# Patient Record
Sex: Female | Born: 1940 | Race: White | Hispanic: No | Smoking: Never smoker
Health system: Southern US, Community
[De-identification: ages and names within clinical notes are randomized; demographics above are authoritative.]

## PROBLEM LIST (undated history)

## (undated) DIAGNOSIS — H269 Unspecified cataract: Secondary | ICD-10-CM

## (undated) DIAGNOSIS — K7689 Other specified diseases of liver: Secondary | ICD-10-CM

## (undated) DIAGNOSIS — K2270 Barrett's esophagus without dysplasia: Secondary | ICD-10-CM

## (undated) DIAGNOSIS — I10 Essential (primary) hypertension: Secondary | ICD-10-CM

## (undated) DIAGNOSIS — I480 Paroxysmal atrial fibrillation: Secondary | ICD-10-CM

## (undated) DIAGNOSIS — G2581 Restless legs syndrome: Secondary | ICD-10-CM

## (undated) DIAGNOSIS — M4800 Spinal stenosis, site unspecified: Secondary | ICD-10-CM

## (undated) DIAGNOSIS — G4730 Sleep apnea, unspecified: Secondary | ICD-10-CM

## (undated) DIAGNOSIS — E785 Hyperlipidemia, unspecified: Secondary | ICD-10-CM

## (undated) DIAGNOSIS — M8700 Idiopathic aseptic necrosis of unspecified bone: Secondary | ICD-10-CM

## (undated) DIAGNOSIS — K219 Gastro-esophageal reflux disease without esophagitis: Secondary | ICD-10-CM

## (undated) DIAGNOSIS — K279 Peptic ulcer, site unspecified, unspecified as acute or chronic, without hemorrhage or perforation: Secondary | ICD-10-CM

## (undated) DIAGNOSIS — R002 Palpitations: Secondary | ICD-10-CM

## (undated) DIAGNOSIS — M1990 Unspecified osteoarthritis, unspecified site: Secondary | ICD-10-CM

## (undated) DIAGNOSIS — K449 Diaphragmatic hernia without obstruction or gangrene: Secondary | ICD-10-CM

## (undated) DIAGNOSIS — R0789 Other chest pain: Secondary | ICD-10-CM

## (undated) DIAGNOSIS — E559 Vitamin D deficiency, unspecified: Secondary | ICD-10-CM

## (undated) DIAGNOSIS — Z5189 Encounter for other specified aftercare: Secondary | ICD-10-CM

## (undated) DIAGNOSIS — F329 Major depressive disorder, single episode, unspecified: Secondary | ICD-10-CM

## (undated) DIAGNOSIS — F32A Depression, unspecified: Secondary | ICD-10-CM

## (undated) HISTORY — PX: DECOMPRESSION CORE HIP: SUR398

## (undated) HISTORY — DX: Major depressive disorder, single episode, unspecified: F32.9

## (undated) HISTORY — PX: HIP FRACTURE SURGERY: SHX118

## (undated) HISTORY — PX: CHOLECYSTECTOMY: SHX55

## (undated) HISTORY — DX: Spinal stenosis, site unspecified: M48.00

## (undated) HISTORY — DX: Sleep apnea, unspecified: G47.30

## (undated) HISTORY — DX: Gastro-esophageal reflux disease without esophagitis: K21.9

## (undated) HISTORY — DX: Idiopathic aseptic necrosis of unspecified bone: M87.00

## (undated) HISTORY — DX: Vitamin D deficiency, unspecified: E55.9

## (undated) HISTORY — DX: Other chest pain: R07.89

## (undated) HISTORY — DX: Unspecified cataract: H26.9

## (undated) HISTORY — DX: Encounter for other specified aftercare: Z51.89

## (undated) HISTORY — DX: Palpitations: R00.2

## (undated) HISTORY — PX: COLONOSCOPY: SHX174

## (undated) HISTORY — DX: Diaphragmatic hernia without obstruction or gangrene: K44.9

## (undated) HISTORY — PX: TUBAL LIGATION: SHX77

## (undated) HISTORY — DX: Peptic ulcer, site unspecified, unspecified as acute or chronic, without hemorrhage or perforation: K27.9

## (undated) HISTORY — PX: BREAST REDUCTION SURGERY: SHX8

## (undated) HISTORY — DX: Paroxysmal atrial fibrillation: I48.0

## (undated) HISTORY — DX: Barrett's esophagus without dysplasia: K22.70

## (undated) HISTORY — DX: Other specified diseases of liver: K76.89

## (undated) HISTORY — DX: Hyperlipidemia, unspecified: E78.5

## (undated) HISTORY — DX: Depression, unspecified: F32.A

## (undated) HISTORY — DX: Unspecified osteoarthritis, unspecified site: M19.90

---

## 1998-05-12 ENCOUNTER — Encounter: Admission: RE | Admit: 1998-05-12 | Discharge: 1998-08-10 | Payer: Self-pay | Admitting: Family Medicine

## 1998-07-11 ENCOUNTER — Other Ambulatory Visit: Admission: RE | Admit: 1998-07-11 | Discharge: 1998-07-11 | Payer: Self-pay | Admitting: Obstetrics and Gynecology

## 2003-02-10 ENCOUNTER — Emergency Department (HOSPITAL_COMMUNITY): Admission: EM | Admit: 2003-02-10 | Discharge: 2003-02-10 | Payer: Self-pay | Admitting: Emergency Medicine

## 2003-02-11 ENCOUNTER — Encounter (INDEPENDENT_AMBULATORY_CARE_PROVIDER_SITE_OTHER): Payer: Self-pay | Admitting: *Deleted

## 2003-02-11 ENCOUNTER — Encounter: Payer: Self-pay | Admitting: Emergency Medicine

## 2003-02-12 ENCOUNTER — Encounter: Payer: Self-pay | Admitting: Gastroenterology

## 2003-02-12 ENCOUNTER — Encounter (INDEPENDENT_AMBULATORY_CARE_PROVIDER_SITE_OTHER): Payer: Self-pay | Admitting: *Deleted

## 2003-02-12 ENCOUNTER — Inpatient Hospital Stay (HOSPITAL_COMMUNITY): Admission: EM | Admit: 2003-02-12 | Discharge: 2003-02-14 | Payer: Self-pay | Admitting: Gastroenterology

## 2003-02-13 ENCOUNTER — Encounter (INDEPENDENT_AMBULATORY_CARE_PROVIDER_SITE_OTHER): Payer: Self-pay | Admitting: Specialist

## 2003-02-13 ENCOUNTER — Encounter (INDEPENDENT_AMBULATORY_CARE_PROVIDER_SITE_OTHER): Payer: Self-pay | Admitting: *Deleted

## 2003-02-14 ENCOUNTER — Encounter (INDEPENDENT_AMBULATORY_CARE_PROVIDER_SITE_OTHER): Payer: Self-pay | Admitting: *Deleted

## 2003-03-16 ENCOUNTER — Ambulatory Visit (HOSPITAL_COMMUNITY): Admission: RE | Admit: 2003-03-16 | Discharge: 2003-03-16 | Payer: Self-pay | Admitting: Gastroenterology

## 2003-03-16 ENCOUNTER — Encounter (INDEPENDENT_AMBULATORY_CARE_PROVIDER_SITE_OTHER): Payer: Self-pay | Admitting: *Deleted

## 2005-08-27 ENCOUNTER — Inpatient Hospital Stay (HOSPITAL_COMMUNITY): Admission: RE | Admit: 2005-08-27 | Discharge: 2005-09-01 | Payer: Self-pay | Admitting: Orthopedic Surgery

## 2005-08-27 ENCOUNTER — Ambulatory Visit: Payer: Self-pay | Admitting: Physical Medicine & Rehabilitation

## 2005-12-12 ENCOUNTER — Ambulatory Visit: Payer: Self-pay | Admitting: Physical Medicine & Rehabilitation

## 2005-12-12 ENCOUNTER — Inpatient Hospital Stay (HOSPITAL_COMMUNITY): Admission: RE | Admit: 2005-12-12 | Discharge: 2005-12-17 | Payer: Self-pay | Admitting: Orthopedic Surgery

## 2007-03-11 ENCOUNTER — Encounter (INDEPENDENT_AMBULATORY_CARE_PROVIDER_SITE_OTHER): Payer: Self-pay | Admitting: *Deleted

## 2007-04-15 ENCOUNTER — Encounter (INDEPENDENT_AMBULATORY_CARE_PROVIDER_SITE_OTHER): Payer: Self-pay | Admitting: *Deleted

## 2007-04-23 ENCOUNTER — Encounter (INDEPENDENT_AMBULATORY_CARE_PROVIDER_SITE_OTHER): Payer: Self-pay | Admitting: *Deleted

## 2007-04-25 ENCOUNTER — Encounter (INDEPENDENT_AMBULATORY_CARE_PROVIDER_SITE_OTHER): Payer: Self-pay | Admitting: *Deleted

## 2007-11-06 ENCOUNTER — Encounter: Payer: Self-pay | Admitting: Pulmonary Disease

## 2007-12-30 ENCOUNTER — Encounter: Payer: Self-pay | Admitting: Pulmonary Disease

## 2008-06-23 ENCOUNTER — Encounter (INDEPENDENT_AMBULATORY_CARE_PROVIDER_SITE_OTHER): Payer: Self-pay | Admitting: *Deleted

## 2008-07-12 ENCOUNTER — Encounter (INDEPENDENT_AMBULATORY_CARE_PROVIDER_SITE_OTHER): Payer: Self-pay | Admitting: *Deleted

## 2008-08-17 ENCOUNTER — Encounter (INDEPENDENT_AMBULATORY_CARE_PROVIDER_SITE_OTHER): Payer: Self-pay | Admitting: *Deleted

## 2008-08-24 ENCOUNTER — Encounter (INDEPENDENT_AMBULATORY_CARE_PROVIDER_SITE_OTHER): Payer: Self-pay | Admitting: *Deleted

## 2008-09-02 ENCOUNTER — Encounter (INDEPENDENT_AMBULATORY_CARE_PROVIDER_SITE_OTHER): Payer: Self-pay | Admitting: *Deleted

## 2009-03-18 ENCOUNTER — Encounter (INDEPENDENT_AMBULATORY_CARE_PROVIDER_SITE_OTHER): Payer: Self-pay | Admitting: *Deleted

## 2009-04-01 ENCOUNTER — Encounter (INDEPENDENT_AMBULATORY_CARE_PROVIDER_SITE_OTHER): Payer: Self-pay | Admitting: *Deleted

## 2009-04-14 ENCOUNTER — Encounter (INDEPENDENT_AMBULATORY_CARE_PROVIDER_SITE_OTHER): Payer: Self-pay | Admitting: *Deleted

## 2009-04-19 ENCOUNTER — Encounter (INDEPENDENT_AMBULATORY_CARE_PROVIDER_SITE_OTHER): Payer: Self-pay | Admitting: *Deleted

## 2009-04-27 ENCOUNTER — Encounter (INDEPENDENT_AMBULATORY_CARE_PROVIDER_SITE_OTHER): Payer: Self-pay | Admitting: *Deleted

## 2009-05-05 ENCOUNTER — Encounter (INDEPENDENT_AMBULATORY_CARE_PROVIDER_SITE_OTHER): Payer: Self-pay | Admitting: *Deleted

## 2009-05-11 ENCOUNTER — Encounter (INDEPENDENT_AMBULATORY_CARE_PROVIDER_SITE_OTHER): Payer: Self-pay | Admitting: *Deleted

## 2009-09-24 HISTORY — PX: EYE SURGERY: SHX253

## 2010-04-12 ENCOUNTER — Encounter (INDEPENDENT_AMBULATORY_CARE_PROVIDER_SITE_OTHER): Payer: Self-pay | Admitting: *Deleted

## 2010-06-13 DIAGNOSIS — M8700 Idiopathic aseptic necrosis of unspecified bone: Secondary | ICD-10-CM | POA: Insufficient documentation

## 2010-06-13 DIAGNOSIS — F329 Major depressive disorder, single episode, unspecified: Secondary | ICD-10-CM

## 2010-06-13 DIAGNOSIS — Z8719 Personal history of other diseases of the digestive system: Secondary | ICD-10-CM

## 2010-06-13 DIAGNOSIS — K7689 Other specified diseases of liver: Secondary | ICD-10-CM | POA: Insufficient documentation

## 2010-06-13 DIAGNOSIS — M1990 Unspecified osteoarthritis, unspecified site: Secondary | ICD-10-CM | POA: Insufficient documentation

## 2010-06-13 DIAGNOSIS — M26629 Arthralgia of temporomandibular joint, unspecified side: Secondary | ICD-10-CM

## 2010-06-19 DIAGNOSIS — R197 Diarrhea, unspecified: Secondary | ICD-10-CM | POA: Insufficient documentation

## 2010-06-19 DIAGNOSIS — E669 Obesity, unspecified: Secondary | ICD-10-CM

## 2010-06-19 DIAGNOSIS — K5730 Diverticulosis of large intestine without perforation or abscess without bleeding: Secondary | ICD-10-CM | POA: Insufficient documentation

## 2010-06-19 DIAGNOSIS — I480 Paroxysmal atrial fibrillation: Secondary | ICD-10-CM

## 2010-06-19 DIAGNOSIS — K449 Diaphragmatic hernia without obstruction or gangrene: Secondary | ICD-10-CM | POA: Insufficient documentation

## 2010-06-19 DIAGNOSIS — G2581 Restless legs syndrome: Secondary | ICD-10-CM

## 2010-06-20 ENCOUNTER — Ambulatory Visit: Payer: Self-pay | Admitting: Internal Medicine

## 2010-07-13 ENCOUNTER — Ambulatory Visit: Payer: Self-pay | Admitting: Obstetrics and Gynecology

## 2010-07-13 ENCOUNTER — Other Ambulatory Visit: Admission: RE | Admit: 2010-07-13 | Discharge: 2010-07-13 | Payer: Self-pay | Admitting: Obstetrics and Gynecology

## 2010-07-19 ENCOUNTER — Ambulatory Visit: Payer: Self-pay | Admitting: Obstetrics and Gynecology

## 2010-10-05 ENCOUNTER — Encounter: Payer: Self-pay | Admitting: Internal Medicine

## 2010-10-09 ENCOUNTER — Ambulatory Visit: Payer: Self-pay | Admitting: Cardiovascular Disease

## 2010-10-10 ENCOUNTER — Encounter: Payer: Self-pay | Admitting: Internal Medicine

## 2010-10-18 ENCOUNTER — Ambulatory Visit
Admission: RE | Admit: 2010-10-18 | Discharge: 2010-10-18 | Payer: Self-pay | Source: Home / Self Care | Attending: Internal Medicine | Admitting: Internal Medicine

## 2010-10-18 ENCOUNTER — Encounter: Payer: Self-pay | Admitting: Internal Medicine

## 2010-10-18 ENCOUNTER — Other Ambulatory Visit: Payer: Self-pay | Admitting: Internal Medicine

## 2010-10-18 LAB — HEPATIC FUNCTION PANEL
ALT: 14 U/L (ref 0–35)
Albumin: 3.8 g/dL (ref 3.5–5.2)
Alkaline Phosphatase: 88 U/L (ref 39–117)
Bilirubin, Direct: 0.1 mg/dL (ref 0.0–0.3)
Total Bilirubin: 0.6 mg/dL (ref 0.3–1.2)
Total Protein: 6.6 g/dL (ref 6.0–8.3)

## 2010-10-18 LAB — CBC WITH DIFFERENTIAL/PLATELET
Basophils Absolute: 0 10*3/uL (ref 0.0–0.1)
Eosinophils Absolute: 0.1 10*3/uL (ref 0.0–0.7)
Eosinophils Relative: 1.5 % (ref 0.0–5.0)
HCT: 39.6 % (ref 36.0–46.0)
Hemoglobin: 13.4 g/dL (ref 12.0–15.0)
Lymphocytes Relative: 14.3 % (ref 12.0–46.0)
Lymphs Abs: 1 10*3/uL (ref 0.7–4.0)
MCHC: 33.7 g/dL (ref 30.0–36.0)
MCV: 91 fl (ref 78.0–100.0)
Monocytes Absolute: 0.4 10*3/uL (ref 0.1–1.0)
Monocytes Relative: 6.1 % (ref 3.0–12.0)
Neutro Abs: 5.4 10*3/uL (ref 1.4–7.7)
RDW: 14.1 % (ref 11.5–14.6)

## 2010-10-18 LAB — LIPID PANEL
Cholesterol: 244 mg/dL — ABNORMAL HIGH (ref 0–200)
Total CHOL/HDL Ratio: 3
Triglycerides: 68 mg/dL (ref 0.0–149.0)
VLDL: 13.6 mg/dL (ref 0.0–40.0)

## 2010-10-18 LAB — BASIC METABOLIC PANEL
BUN: 19 mg/dL (ref 6–23)
CO2: 30 mEq/L (ref 19–32)
Calcium: 9.1 mg/dL (ref 8.4–10.5)
Chloride: 106 mEq/L (ref 96–112)
Creatinine, Ser: 1 mg/dL (ref 0.4–1.2)
Glucose, Bld: 91 mg/dL (ref 70–99)
Potassium: 3.9 mEq/L (ref 3.5–5.1)

## 2010-10-20 ENCOUNTER — Ambulatory Visit
Admission: RE | Admit: 2010-10-20 | Discharge: 2010-10-20 | Payer: Self-pay | Source: Home / Self Care | Attending: Internal Medicine | Admitting: Internal Medicine

## 2010-10-23 ENCOUNTER — Telehealth: Payer: Self-pay

## 2010-10-24 NOTE — Discharge Summary (Signed)
Summary: Gallstones    NAME:  Cassandra Miles, Cassandra Miles                          ACCOUNT NO.:  [id number]   MEDICAL RECORD NO.:  [id number]                   PATIENT TYPE:  INP   LOCATION:  0474                                 FACILITY:  [facility]   PHYSICIAN:  Lorre Munroe., M.D.            DATE OF BIRTH:  August 15, 1941   DATE OF ADMISSION:  02/12/2003  DATE OF DISCHARGE:  02/14/2003                                 DISCHARGE SUMMARY   HISTORY:  A 69 year old white female with biliary colic by clinical  evaluation and gallstones on ultrasound.  She was admitted to the hospital  with fever and jaundice.  Bilirubin level was 4.1.  White count 13,000.  Her  general health is good.  There is a history of depression.  She has had a  tubal ligation and she is allergic to penicillin.  See history and physical  for further details.   PHYSICAL EXAMINATION:  ABDOMEN:  Mildly tender.  The remainder of the  examination was unremarkable.   HOSPITAL COURSE:  The patient was admitted by Everardo All. Madilyn Fireman, M.D. and seen  by me on May 22.  She agreed to laparoscopic cholecystectomy and that  operation took place on Feb 13, 2003.  She recovered nicely and was sent  home on May 23.  John C. Madilyn Fireman, M.D. had performed ERCP on the date of  admission, May 21.  She recovered well from the laparoscopic  cholecystectomy.  Her laboratories were improving on the day after surgery.  She was sent home on Cipro 500 mg b.i.d. for five days and was to see me in  the office in two to three weeks.   DIAGNOSES:  Cholelithiasis and choledocholithiasis.   OPERATION:  Laparoscopic cholecystectomy.   CONDITION ON DISCHARGE:  Improved.                                               Lorre Munroe., M.D.    WB/MEDQ  D:  04/12/2003  T:  04/12/2003  Job:  [id number]

## 2010-10-24 NOTE — Letter (Signed)
Summary: [facility]  [facility]   Imported By: Lamona Curl CMA (AAMA) 06/19/2010 16:50:19  _____________________________________________________________________  External Attachment:    Type:   Image     Comment:   External Document

## 2010-10-24 NOTE — Consult Note (Signed)
Summary: Abdominal Pain    NAME:  Cassandra Miles, Cassandra Miles                          ACCOUNT NO.:  [id number]   MEDICAL RECORD NO.:  [id number]                   PATIENT TYPE:  EMS   LOCATION:  ED                                   FACILITY:  [facility]   PHYSICIAN:  Currie Paris, M.D.           DATE OF BIRTH:  27-Jul-1941   DATE OF CONSULTATION:  02/11/2003  DATE OF DISCHARGE:                                   CONSULTATION   CHIEF COMPLAINT:  Abdominal pain.   HISTORY OF PRESENT ILLNESS:  Cassandra Miles is a 70 year old lady who presented  to the emergency room with abdominal pain which is pan-like epigastrium  going into primarily the right upper quadrant, but a little bit into the  left upper quadrant associated with some nausea.  She said is appeared a  little while after eating a chocolate frosty from [facility].  She is now  feeling better, but has had pain medication.  She notes that she has had  numerous episodes of vomiting tonight.  She really did not find anything  that would get her pain to get better, it was fairly constant.  Nothing  really seemed to effect it significantly.  She notes in the past that she  has had some intermittent epigastric pain, but never like this, and can  generally do anything that she wants without problems.   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   MEDICATIONS:  Celexa.   ALLERGIES:  PENICILLIN.   HABITS:  Smokes none, alcohol occasional.   FAMILY HISTORY:  Positive for gallstones.   REVIEW OF SYMPTOMS:  HEENT:  The patient has had no significant HEENT  symptoms.  CHEST:  No history of cough, shortness of breath, pulmonary  problems.  HEART:  No history of significant symptoms, although she at one  point had some hypertension, never treated, and apparently resolved.  GASTROINTESTINAL:  Negative, except for  today.  GENITOURINARY:  Normal  urination, no menstrual symptoms, vaginal discharge, etc.  EXTREMITIES:  Has  some superficial varicosities seen on her legs.   PHYSICAL EXAMINATION:  GENERAL:  The patient is a generally healthy-  appearing 70 year old, alert, awake, and comfortable.  VITAL SIGNS:  Blood pressure 148/80, temperature 98, pulse 77, respirations  16.  HEENT:  Head is normocephalic.  Eyes are nonicteric.  Pupils equal, round,  and regular.  Pharynx is normal mucous membranes moist.  NECK:  Supple, no masses or thyromegaly.  CHEST:  Clear to auscultation.  Normal respirations.  HEART:  Regular, no murmurs, rubs, or gallops heard.  Normal pulses.  ABDOMEN:  Generally soft.  There is a well-healed umbilical scar.  She has  very minimal tenderness in the right upper quadrant.  There is no guarding  or  rebound present.  Bowel sounds are normal.  EXTREMITIES:  No cyanosis or edema.  She does have superficial varicosities.   LABORATORY DATA:  White blood cell count 10,000.  Slight elevation of  bilirubin and alkaline phosphatase.  Ultrasound shows  a couple of small  gallstones, but no evidence of acute cholecystitis.   IMPRESSION:  Biliary colic with gallstones.   PLAN:  Discussed options with the patient, and offered her today to try to  schedule surgery later on.  However, she is feeling better, and really like  to try to schedule surgery electively.  I told her that we could attempt to  do that, and she will call the office later in the day when the office is  open and set up a time.  I went over the surgery with her today.  I also  told her that we would just like to recheck some labs on her Friday to make  sure liver functions are not getting worse, and that it would suggest a more  formal urgent intervention, and that should she have any recurrent or  increase of her pain, we would need to go ahead with cholecystectomy more  urgently rather then in a more elective  mode.                                               Currie Paris, M.D.    CJS/MEDQ  D:  02/11/2003  T:  02/11/2003  Job:  [id number]

## 2010-10-24 NOTE — Assessment & Plan Note (Signed)
Summary: Cassandra Miles...AS.   History of Present Illness Visit Type: Initial Visit Primary GI MD: Cassandra Sar MD Primary Provider: n/a Chief Complaint: Barrett's esophagus History of Present Illness:   This is a 70 year old white female with a new diagnosis of Barrett's esophagus on an upper endoscopy in 2009 and again in August 2010 in [place], [place] by Dr Cassandra Miles. I have reviewed the records and the biopsies which  show   irregular Z line and foci of  goblet cells. There was also a patch of heterotopic gastric mucosa at 18 cm which did not have goblet cells but is often associated with Barrett's esophagus. Her last colonoscopy was in December 2008 and again in 2009 to evaluate Hemoccult-positive stool. No cause was found. She is now on omeprazole 40 mg daily  with control of her reflux. She had a laprascopic cholecystectomy in 2004 here in [place] after presenting with choledocholithiasis. Patient had an ERCP sphincterotomy and removal of bile duct stones by Cassandra Miles. She has a history of diarrhea attributed to irritable bowel syndrome. Apparently, her bowel habits are regular. She is negative for sprue workup and she had negative colon biopsies for microscopic colitis.   GI Review of Systems      Denies abdominal pain, acid reflux, belching, bloating, chest pain, dysphagia with liquids, dysphagia with solids, heartburn, loss of appetite, nausea, vomiting, vomiting blood, weight loss, and  weight gain.        Denies anal fissure, black tarry stools, change in bowel habit, constipation, diarrhea, diverticulosis, fecal incontinence, heme positive stool, hemorrhoids, irritable bowel syndrome, jaundice, light color stool, liver problems, rectal bleeding, and  rectal pain.    Current Medications (verified): 1)  Zoloft 100 Mg Tabs (Sertraline Hcl) .... Take 1 Tablet By Mouth Once A Day 2)  Omeprazole 40 Mg Cpdr (Omeprazole) .... Take 1 Tablet By Mouth Once A Day 3)  Aleve 220 Mg Tabs (Naproxen  Sodium) .... Take 2 Tablets By Mouth Qd As Needed 4)  Ropinirole Hcl 1 Mg Tabs (Ropinirole Hcl) .... Take 1 Tab By Mouth At Bedtime  Allergies (verified): 1)  ! Pcn 2)  ! Cipro  Past History:  Past Medical History: Reviewed history from 06/13/2010 and no changes required. Current Problems:  AVASCULAR NECROSIS (ICD-733.40) TEMPOROMANDIBULAR JOINT PAIN (ICD-524.62) OSTEOARTHRITIS (ICD-715.90) DEPRESSION (ICD-311) HEPATIC CYST (ICD-573.8) BARRETT'S ESOPHAGUS, HX OF (ICD-V12.79)    Past Surgical History: Reviewed history from 06/19/2010 and no changes required. Left total hip arthroplasty Right total hip arthroplasty Laparoscopic cholecystectomy Tubal Ligation Bilateral cord decompressions of both hips Bilateral Breast Reduction  Family History: Family History of Colon Cancer: Paternal Aunt x2 Family History of Diabetes:Brother  Family History of Heart Disease: Mother  Social History: Reviewed history from 06/19/2010 and no changes required. Alcohol Use - yes-occasional  Illicit Drug Use - no Patient does not get regular exercise.   Review of Systems  The patient denies allergy/sinus, anemia, anxiety-new, arthritis/joint pain, back pain, blood in urine, breast changes/lumps, change in vision, confusion, cough, coughing up blood, depression-new, fainting, fatigue, fever, headaches-new, hearing problems, heart murmur, heart rhythm changes, itching, menstrual pain, muscle pains/cramps, night sweats, nosebleeds, pregnancy symptoms, shortness of breath, skin rash, sleeping problems, sore throat, swelling of feet/legs, swollen lymph glands, thirst - excessive , urination - excessive , urination changes/pain, urine leakage, vision changes, and voice change.         Pertinent positive and negative review of systems were noted in the above HPI. All other ROS was otherwise negative.  Vital Signs:  Patient profile:   70 year old female Height:      67 inches Weight:      189.38  pounds BMI:     29.77 Pulse rate:   76 / minute Pulse rhythm:   regular BP sitting:   122 / 80  (right arm) Cuff size:   regular  Vitals Entered By: Cassandra Miles CMA Cassandra Miles) (June 20, 2010 1:39 PM)  Physical Exam  General:  Well developed, well nourished, no acute distress. Eyes:  PERRLA, no icterus. Mouth:  No deformity or lesions, dentition normal. Neck:  Supple; no masses or thyromegaly. Lungs:  Clear throughout to auscultation. Heart:  rapid S1 and S2. Abdomen:  Soft, nontender and nondistended. No masses, hepatosplenomegaly or hernias noted. Normal bowel sounds. Extremities:  No clubbing, cyanosis, edema or deformities noted. Skin:  Intact without significant lesions or rashes. Psych:  Alert and cooperative. Normal mood and affect.   Impression & Recommendations:  Problem # 1:  BARRETT'S ESOPHAGUS, HX OF (ICD-V12.79) A recall upper endoscopy should be completed before the end of the year. We will send her a recall letter in November 2011. She is to continue omeprazole 40 mg daily.  Problem # 2:  DIVERTICULOSIS, COLON (ICD-562.10) Patient has irritable bowel syndrome with predominant diarrhea under good control. Her last colonoscopy was in 2009. Her next colonoscopy will be due in 2019.  Problem # 3:  ATRIAL FIBRILLATION (ICD-427.31) I recommended followup with a cardiologist.  Patient Instructions: 1)  continue pantoprazole 40 mg daily. 2)  Followup upper endoscopy in December 2011, recall in [facility]. 3)  Antireflux measures. 4)  Recall colonoscopy in 2019. 5)  The medication list was reviewed and reconciled.  All changed / newly prescribed medications were explained.  A complete medication list was provided to the patient / caregiver.

## 2010-10-24 NOTE — Letter (Signed)
Summary: [facility]  [facility]   Imported By: Lamona Curl CMA (AAMA) 06/19/2010 16:45:27  _____________________________________________________________________  External Attachment:    Type:   Image     Comment:   External Document

## 2010-10-24 NOTE — Op Note (Signed)
Summary: Laparoscopic Cholecystectomy    NAME:  Cassandra Miles, Cassandra Miles                          ACCOUNT NO.:  [id number]   MEDICAL RECORD NO.:  [id number]                   PATIENT TYPE:  INP   LOCATION:  0474                                 [facility]ACILITY[facility]  [facility]   PHYSICIAN:  Lorre Munroe., M.D.            DATE OF BIRTH:  1941/02/27   DATE OF PROCEDURE:  02/13/2003  DATE OF DISCHARGE:                                 OPERATIVE REPORT   PREOPERATIVE DIAGNOSIS:  Symptomatic gallstones.   POSTOPERATIVE DIAGNOSIS:  Symptomatic gallstones.   OPERATION:  Laparoscopic cholecystectomy.   SURGEON:  Zigmund Daniel, M.D.   ASSISTANT:  Adolph Pollack, M.D.   ANESTHESIA:  General.   DESCRIPTION OF PROCEDURE:  After the patient was monitored and anesthetized  and had routine preparation and draping of the abdomen, I infused local  anesthetic just below the umbilicus and made a transverse incision about 2  cm long, dissected down to the fascia, and opened it longitudinally in the  midline, then bluntly opened the peritoneum.  I placed 0 Vicryl pursestring  suture in the fascia and secured a Hasson cannula and inflated the abdomen  with CO2.  No abnormalities were noted except for numerous small cysts on  the liver and some edema of the gallbladder from adhesions of omentum to the  undersurface of the gallbladder.  After anesthetizing three additional sites  and placing three additional ports and positioning of the patient head-up,  foot-down, and tilted to the left, I retracted the fundus of the gallbladder  toward the right shoulder and took down the adhesions.  Then I identified  the infundibulum of the gallbladder.  I traced it downward until I saw the  cystic duct clearly emerging.  I clipped the cystic duct with four clips and  cut between the two closest to the gallbladder.  I then identified and  similarly clipped and divided the cystic artery.  I then used a hook-shaped  dissector  with cautery to dissect the gallbladder from its fossa.  No  accessory ducts were seen, and no additional arteries were noted.  Hemostasis was excellent.  The grasper accidentally made a hole in the  gallbladder, and some stones spilled out, and I removed those with the  section and with the scoop.  After detaching the gallbladder from the liver,  I placed it in a plastic pouch and reserved it above the liver, where I  copiously irrigated the right upper quadrant and removed the irrigant.  Hemostasis was good, and there was no leakage of bile, and the clips  appeared to be secure.  I then examined the pelvis, and the ovaries were not  evident.  There appeared to perhaps be a bladder diverticulum, but there was  no inflammation at all in the pelvis.  The right upper quadrant again was  inspected and looked fine.  I removed the  gallbladder through  the umbilical incision and tied the pursestring suture.  I removed the lateral ports under direct vision, then allowed the CO2 to  escape and removed the epigastric port.  I closed the skin incisions with  intracuticular 4-0 Vicryl and Steri-Strips.  The patient tolerated the  operation well.                                               Lorre Munroe., M.D.    WB/MEDQ  D:  02/13/2003  T:  02/13/2003  Job:  [id number]   cc:   Everardo All. Madilyn Fireman, M.D.  [address]  [place]  [place] [address]  Fax: [phone]

## 2010-10-24 NOTE — Letter (Signed)
Summary: [facility]  [facility]   Imported By: Lamona Curl CMA (AAMA) 06/19/2010 16:45:56  _____________________________________________________________________  External Attachment:    Type:   Image     Comment:   External Document

## 2010-10-24 NOTE — Letter (Signed)
Summary: [facility]LVIS  [facility]   Imported By: Lamona Curl CMA (AAMA) 06/19/2010 16:37:56  _____________________________________________________________________  External Attachment:    Type:   Image     Comment:   External Document

## 2010-10-24 NOTE — Letter (Signed)
Summary: [facility] A[facility]/PELVIS  [facility] ABD/PELVIS   Imported By: Lamona Curl CMA (AAMA) 06/19/2010 16:33:46  _____________________________________________________________________  External Attachment:    Type:   Image     Comment:   External Document

## 2010-10-24 NOTE — Letter (Signed)
Summary: New Patient letter  [facility]  [address] [place], [place] [address]   Phone: [phone]  Fax: [phone]       04/12/2010 MRN: [id number]  Cassandra Miles [address], [place]  [address]  Dear Ms. Cassandra Miles,  Welcome to the Gastroenterology Division at [facility].    You are scheduled to see Dr. Juanda Chance on 06/20/2010 at 1:30PM on the 3rd floor at [facility], [address]  We ask that you try to arrive at our office 15 minutes prior to your appointment time to allow for check-in.  We would like you to complete the enclosed self-administered evaluation form prior to your visit and bring it with you on the day of your appointment.  We will review it with you.  Also, please bring a complete list of all your medications or, if you prefer, bring the medication bottles and we will list them.  Please bring your insurance card so that we may make a copy of it.  If your insurance requires a referral to see a specialist, please bring your referral form from your primary care physician.  Co-payments are due at the time of your visit and may be paid by cash, check or credit card.     Your office visit will consist of a consult with your physician (includes a physical exam), any laboratory testing he/she may order, scheduling of any necessary diagnostic testing (e.g. x-ray, ultrasound, CT-scan), and scheduling of a procedure (e.g. Endoscopy, Colonoscopy) if required.  Please allow enough time on your schedule to allow for any/all of these possibilities.    If you cannot keep your appointment, please call [phone] to cancel or reschedule prior to your appointment date.  This allows [place] the opportunity to schedule an appointment for another patient in need of care.  If you do not cancel or reschedule by 5 p.m. the business day prior to your appointment date, you will be charged a $50.00 late cancellation/no-show fee.    Thank you for choosing [facility] for your medical needs.  We appreciate the opportunity to care for you.  Please visit [place] at our website  to learn more about our practice.                     Sincerely,                                                             The Gastroenterology Division

## 2010-10-24 NOTE — Letter (Signed)
Summary: [facility]  [facility]   Imported By: Lamona Curl CMA (AAMA) 06/19/2010 16:34:21  _____________________________________________________________________  External Attachment:    Type:   Image     Comment:   External Document

## 2010-10-24 NOTE — Letter (Signed)
Summary: [facility]  [facility]   Imported By: Lamona Curl CMA (AAMA) 06/19/2010 16:37:24  _____________________________________________________________________  External Attachment:    Type:   Image     Comment:   External Document

## 2010-10-24 NOTE — Letter (Signed)
Summary: [facility]  [facility]   Imported By: Lamona Curl CMA (AAMA) 06/19/2010 16:41:07  _____________________________________________________________________  External Attachment:    Type:   Image     Comment:   External Document

## 2010-10-24 NOTE — Letter (Signed)
Summary: [facility]  [facility]   Imported By: Lamona Curl CMA (AAMA) 06/19/2010 16:41:43  _____________________________________________________________________  External Attachment:    Type:   Image     Comment:   External Document

## 2010-10-24 NOTE — Letter (Signed)
Summary: [facility]  [facility]   Imported By: Lamona Curl CMA (AAMA) 06/19/2010 16:39:14  _____________________________________________________________________  External Attachment:    Type:   Image     Comment:   External Document

## 2010-10-24 NOTE — Letter (Signed)
Summary: [facility]  [facility]   Imported By: Lamona Curl CMA (AAMA) 06/19/2010 16:42:37  _____________________________________________________________________  External Attachment:    Type:   Image     Comment:   External Document

## 2010-10-24 NOTE — Letter (Signed)
Summary: [facility]  [facility]   Imported By: Lamona Curl CMA (AAMA) 06/19/2010 16:46:49  _____________________________________________________________________  External Attachment:    Type:   Image     Comment:   External Document

## 2010-10-24 NOTE — Letter (Signed)
Summary: [facility]  [facility]   Imported By: Lamona Curl CMA (AAMA) 06/19/2010 16:35:33  _____________________________________________________________________  External Attachment:    Type:   Image     Comment:   External Document

## 2010-10-24 NOTE — Op Note (Signed)
Summary: EGD    NAME:  Cassandra Miles, Cassandra Miles                          ACCOUNT NO.:  [id number]   MEDICAL RECORD NO.:  [id number]                   PATIENT TYPE:  AMB   LOCATION:  ENDO                                 [facility]ACILITY[facility]  [facility]   PHYSICIAN:  John C. Madilyn Fireman, M.D.                 DATE OF BIRTH:  [birth year]   DATE OF PROCEDURE:  03/16/2003  DATE OF DISCHARGE:                                 OPERATIVE REPORT   PROCEDURE:  Esophagogastroduodenoscopy with removal of biliary stent.   INDICATION FOR PROCEDURE:  Patient with recent cholecystectomy and suspected  common bile duct stone, who had a temporary biliary stent placed.  She has  done well with normalization of liver function tests.  The procedure is to  remove the biliary stent.   PROCEDURE:  The patient was placed in the prone position and placed on the  pulse monitor with continuous low-flow oxygen delivered by nasal cannula.  She was sedated with 75 mcg IV fentanyl and 7.5 mg IV Versed.  The Olympus  side-viewing endoscope was advanced blindly into the oropharynx, esophagus,  stomach.  The stomach appeared grossly normal.  The pylorus was traversed  and the papilla of Vater located on the duodenal wall with a blue biliary  stent protruding from it.  It was grasped with the snare and removed  together with the endoscope.  No cholangiogram was performed.  The patient  was then returned to the recovery room in stable condition.  She tolerated  the procedure well, and there were no immediate complications.   IMPRESSION:  Successful removal of previously-placed biliary stent,  otherwise normal endoscopy.                                               John C. Madilyn Fireman, M.D.    [facility]/MEDQ  D:  03/16/2003  T:  03/16/2003  Job:  [id number]   cc:   Lorre Munroe., M.D.  Fax: [phone]   Talmadge Coventry, M.D.  [address]  [place]  [place] [address]  Fax: [phone]

## 2010-10-24 NOTE — Letter (Signed)
Summary: [facility]  [facility]   Imported By: Lamona Curl CMA (AAMA) 06/19/2010 16:48:20  _____________________________________________________________________  External Attachment:    Type:   Image     Comment:   External Document

## 2010-10-24 NOTE — Letter (Signed)
Summary: [facility]   Imported By: Lamona Curl CMA (AAMA) 06/19/2010 16:49:48  _____________________________________________________________________  External Attachment:    Type:   Image     Comment:   External Document

## 2010-10-24 NOTE — Letter (Signed)
Summary: [facility]  [facility]   Imported By: Lamona Curl CMA (AAMA) 06/19/2010 16:38:38  _____________________________________________________________________  External Attachment:    Type:   Image     Comment:   External Document

## 2010-10-24 NOTE — Letter (Signed)
Summary: [facility]  [facility]   Imported By: Lamona Curl CMA (AAMA) 06/19/2010 16:34:55  _____________________________________________________________________  External Attachment:    Type:   Image     Comment:   External Document

## 2010-10-24 NOTE — Letter (Signed)
Summary: [facility]  [facility]   Imported By: Lamona Curl CMA (AAMA) 06/19/2010 16:51:12  _____________________________________________________________________  External Attachment:    Type:   Image     Comment:   External Document

## 2010-10-24 NOTE — Letter (Signed)
Summary: [facility]   Imported By: Lamona Curl CMA (AAMA) 06/19/2010 16:40:01  _____________________________________________________________________  External Attachment:    Type:   Image     Comment:   External Document

## 2010-10-24 NOTE — Op Note (Signed)
Summary: ERCP (Dr Madilyn Fireman)    NAME:  Cassandra Miles, Cassandra Miles                          ACCOUNT NO.:  [id number]   MEDICAL RECORD NO.:  [id number]                   PATIENT TYPE:  INP   LOCATION:  0474                                 FACILITY:  [facility]   PHYSICIAN:  John C. Madilyn Fireman, M.D.                 DATE OF BIRTH:  01/30/1941   DATE OF PROCEDURE:  DATE OF DISCHARGE:                                 OPERATIVE REPORT   PROCEDURE:  Endoscopic retrograde cholangiopancreatography with  sphincterotomy and stent placement.   PREMEDICATION:  8 mg of IV Versed, 87.5 mcg fentanyl.   DESCRIPTION OF PROCEDURE:  The Olympus video side-viewing endoscope was  advanced blindly into the lower pharynx, esophagus and stomach.  The stomach  appeared normal.  The duodenum was entered and the papilla of Vater located  on the medial duodenal wall.  It had a normal appearance and was cannulated  with the Wilson-Cook sphincterotome.  The cholangiogram was obtained which  showed a smooth, tapered, slightly dilated common bile duct with filling of  the cystic duct and gallbladder to some degree and some filling of the  anterior hepatic ducts.  I did not see a stone at the beginning of the  procedure, but based on the clinical scenario, including fever and known  gallstones as well as elevated liver function tests and jaundice, I went  ahead and performed a 1 cm sphincterotomy.  This did not immediately result  in any flow of bile through the ampulla.  We then passed a 12 mm balloon  catheter and while this was being advanced, at one point we saw two or three  round filling defects that were not seen earlier and these were felt to  either represent air bubbles or possibly stones.  The 12 mm balloon catheter  was inflated and dragged through the ampulla with some bubbles seen to be  delivered but no definite stones seen.  A little bit of clear amber bile  came out which was not turbid or murky in appearance.  Several more  balloon  sweeps were made with similar results and no definite stones seen.  At the  end of the last one, a good occlusion cholangiogram was obtained with the  balloon inflated very distally.  The balloon was then deflated and withdrawn  and cholangiogram obtained which gave a good outline of the entire common  bile duct.  No filling defects were seen.  However, I did not see  significant bile exit the ampulla and the dye appeared to remain retained in  the common bile duct over about a five-minute period.  Based on her  documented fever of 103, I decided at that point to place a 5 cm 11.5-French  plastic stent with the Oasis system and this was done with good exit of  clear amber bile.  Scope was then withdrawn and the  patient returned to the  recovery room in stable condition.  She tolerated the procedure well and  there were no immediate complications.   IMPRESSION:  Questionable gallstones versus air bubbles, status post  sphincterotomy and stent placement.   PLAN:  Continue IV antibiotics prior to laparoscopic cholecystectomy.                                               John C. Madilyn Fireman, M.D.   J[facility]/MEDQ  D:  02/12/2003  T:  02/13/2003  Job:  [id number]   cc:   Currie Paris, M.D.  [address], [address]  [place]  [place] [address]  Fax: [phone]

## 2010-10-24 NOTE — Letter (Signed)
Summary: [facility]   Imported By: Lamona Curl CMA (AAMA) 06/19/2010 16:32:35  _____________________________________________________________________  External Attachment:    Type:   Image     Comment:   External Document

## 2010-10-24 NOTE — Letter (Signed)
Summary: [facility]   Imported By: Lamona Curl CMA (AAMA) 06/19/2010 16:33:10  _____________________________________________________________________  External Attachment:    Type:   Image     Comment:   External Document

## 2010-10-26 NOTE — Assessment & Plan Note (Signed)
Summary: tb reading//ccm  Nurse Visit   Allergies: 1)  ! Pcn 2)  ! Cipro 3)  ! Wellbutrin  PPD Results    Date of reading: 10/20/2010    Results: < 5mm    Interpretation: negative

## 2010-10-26 NOTE — Assessment & Plan Note (Signed)
Summary: brand new pt/to est/pt req cpx/pt coming in fasting/cjr   Vital Signs:  Patient profile:   70 year old female Height:      66.5 inches Weight:      187 pounds Pulse rate:   80 / minute BP sitting:   120 / 80  (left arm)  Vitals Entered By: Kyung Rudd, CMA (October 18, 2010 9:36 AM) CC: NP.Marland KitchenMarland Kitchenpt c/o ears stopped up   Primary Care Provider:  n/a  CC:  NP.Marland KitchenMarland Kitchenpt c/o ears stopped up.  History of Present Illness: Patient presents to clinic to establish primary medical care and for complete physical. see gynecology for Pap smears and mammograms. Does have history of mild depression chronically on SSRI. finding medication dose suboptimal with mild sadness but without SI or HI. History of obstructive sleep apnea previous achieving CPAP which she self DC'd. Currently has pending consultation with pulmonary. Has a form to be completed for work including PPD placement and revision check. Complains of 2 and a half week history of diminished hearing right greater than left. No injury trauma or discharge noted. Has had no preceding URI. Has history of Barrett's esophagus now seeing gastroenterology for followup appointment in February to discuss annual EGD. Last EGD August 2010. 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 Review history of paroxysmal atrial fibrillation flutter cardiology and not maintained on Coumadin. States she was told that she suffers from osteopenia and does take OTC vitamin D and calcium. Complains of intermittent bilateral lower extremity leg cramps.  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) 11)  Temporomandibular Joint Pain  (ICD-524.62) 12)  Osteoarthritis  (ICD-715.90) 13)  Depression  (ICD-311) 14)  Hepatic Cyst  (ICD-573.8) 15)  Barrett's Esophagus, Hx of  (ICD-V12.79)  Current Problems (verified): 1)  Obesity  (ICD-278.00) 2)  Hiatal Hernia  (ICD-553.3) 3)  Diverticulosis, Colon  (ICD-562.10) 4)  Hypercholesterolemia  (ICD-272.0) 5)  Restless Legs Syndrome  (ICD-333.94) 6)  Atrial Fibrillation  (ICD-427.31) 7)  Unspecified Essential Hypertension  (ICD-401.9) 8)  Sleep Apnea  (ICD-780.57) 9)  Diarrhea  (ICD-787.91) 10)  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 Take 1 Tablet By Mouth Once A Day 2)  Omeprazole 40 Mg Cpdr (Omeprazole) .... Take 1 Tablet By Mouth Once A Day 3)  Aleve 220 Mg Tabs (Naproxen Sodium) .... Take 2 Tablets By Mouth Qd As Needed 4)  Ropinirole Hcl 1 Mg Tabs (Ropinirole Hcl) .... Take 1 Tab By Mouth At Bedtime  Allergies (verified): 1)  ! Pcn 2)  ! Cipro 3)  ! Wellbutrin  Past History:  Family History: Last updated: 06/20/2010 Family History of Colon Cancer: Paternal Aunt x2 Family History of Diabetes:Brother  Family History of Heart Disease: Mother  Social  History: Last updated: 10/18/2010 Alcohol Use - yes-occasional  Illicit Drug Use - no Patient does not get regular exercise.  Never Smoked  Past medical, surgical, family and social histories (including risk factors) reviewed for relevance to current acute and chronic problems.  Past Medical History: Reviewed history from 06/13/2010 and no changes  required. Current Problems:  AVASCULAR NECROSIS (ICD-733.40) TEMPOROMANDIBULAR JOINT PAIN (ICD-524.62) OSTEOARTHRITIS (ICD-715.90) DEPRESSION (ICD-311) HEPATIC CYST (ICD-573.8) BARRETT'S ESOPHAGUS, HX OF (ICD-V12.79)    Past Surgical History: Reviewed history from 06/19/2010 and no changes required. Left total hip arthroplasty Right total hip arthroplasty Laparoscopic cholecystectomy Tubal Ligation Bilateral cord decompressions of both hips Bilateral Breast Reduction  Family History: Reviewed history from 06/20/2010 and no changes required. Family History of Colon Cancer: Paternal Aunt x2 Family History of Diabetes:Brother  Family History of Heart Disease: Mother  Social History: Reviewed history from 06/19/2010 and no changes required. Alcohol Use - yes-occasional  Illicit Drug Use - no Patient does not get regular exercise.  Never Smoked Smoking Status:  never  Review of Systems General:  Denies chills, fatigue, fever, and sweats. Eyes:  Denies discharge, eye irritation, and eye pain. ENT:  Complains of decreased hearing; denies ear discharge, earache, hoarseness, and nasal congestion. CV:  Denies chest pain or discomfort, difficulty breathing at night, lightheadness, and near fainting. Resp:  Denies chest discomfort, cough, and shortness of breath. GI:  Denies abdominal pain, bloody stools, change in bowel habits, dark tarry stools, nausea, and vomiting. GU:  Denies dysuria, incontinence, and nocturia. MS:  Complains of joint pain and cramps; denies muscle weakness. Derm:  Denies changes in color of skin, flushing, and rash. Neuro:  Denies brief paralysis, falling down, visual disturbances, and weakness. Psych:  Complains of depression; denies anxiety and suicidal thoughts/plans. Endo:  Denies excessive thirst, polyuria, and weight change. Heme:  Denies abnormal bruising, bleeding, and fevers. Allergy:  Denies hives or rash, itching eyes, and persistent  infections.  Physical Exam  General:  Well-developed,well-nourished,in no acute distress; alert,appropriate and cooperative throughout examination Head:  Normocephalic and atraumatic without obvious abnormalities. No apparent alopecia or balding. Eyes:  pupils equal, pupils round, pupils reactive to light, pupils react to accomodation, corneas and lenses clear, and no injection.   Ears:   bilateral cerumen impaction. Canal normal otherwise.no external deformities.   Nose:  External nasal examination shows no deformity or inflammation. Nasal mucosa are pink and moist without lesions or exudates. Mouth:  Oral mucosa and oropharynx without lesions or exudates.  Teeth in good repair. Neck:  No deformities, masses, or tenderness noted. Lungs:  Normal respiratory effort, chest expands symmetrically. Lungs are clear to auscultation, no crackles or wheezes. Heart:  Normal rate and regular rhythm. S1 and S2 normal without gallop, murmur, click, rub or other extra sounds. Abdomen:  Bowel sounds positive,abdomen soft and non-tender without masses, organomegaly or hernias noted. Pulses:  R dorsalis pedis normal and L dorsalis pedis normal.   Extremities:  No clubbing, cyanosis, edema, or deformity noted with normal full range of motion of all joints.   Neurologic:  alert & oriented X3 and gait normal.   Skin:  turgor normal, color normal, and no rashes.   bilateral lower extreme leg varicosities Cervical Nodes:  No lymphadenopathy noted Psych:  Oriented X3, normally interactive, good eye contact, not anxious appearing, and not depressed appearing.     Impression & Recommendations:  Problem # 1:  Preventive Health Care (ICD-V70.0)  obtain screening labs. Place PPD and on return check  perform visual  acuity exam. Pap smears and mammograms per gynecology. follows with GI for EGD and colonoscopy. attempt magnesium oxide one daily for leg cramps. reviewed 1200 mg calcium in split dosing and 800 units of  vitamin D daily recommended. attempt irrigation today. increase Zoloft 150 mg a day. Followup in 6 months or sooner if necessary.  Complete Medication List: 1)  Zoloft 100 Mg Tabs (Sertraline hcl) .... One and a half by mouth qd 2)  Omeprazole 40 Mg Cpdr (Omeprazole) .... Take 1 tablet by mouth once a day 3)  Aleve 220 Mg Tabs (Naproxen sodium) .... Take 2 tablets by mouth qd as needed 4)  Ropinirole Hcl 1 Mg Tabs (Ropinirole hcl) .... Take 1 tab by mouth at bedtime  Other Orders: TLB-CBC Platelet - w/Differential (85025-CBCD) TLB-BMP (Basic Metabolic Panel-BMET) (80048-METABOL) TLB-Hepatic/Liver Function Pnl (80076-HEPATIC) TLB-TSH (Thyroid Stimulating Hormone) (84443-TSH) TLB-Lipid Panel (80061-LIPID) T-Vitamin D (25-Hydroxy) (55732-20254) UA Dipstick w/o Micro (automated)  (81003) Specimen Handling (27062) Venipuncture (37628) TB Skin Test (31517)  Patient Instructions: 1)  Please schedule a follow-up appointment in 6 months. Prescriptions: ZOLOFT 100 MG TABS (SERTRALINE HCL) one and a half by mouth qd  #45 x 11   Entered and Authorized by:   Edwyna Perfect MD   Signed by:   Edwyna Perfect MD on 10/18/2010   Method used:   Electronically to        [facility]. [phone]* (retail)       [address]       [place], [place]  [address]       Ph: [phone]       Fax: [phone]   RxID:   [id number] ROPINIROLE HCL 1 MG TABS (ROPINIROLE HCL) Take 1 tab by mouth at bedtime  #30 x 11   Entered and Authorized by:   Edwyna Perfect MD   Signed by:   Edwyna Perfect MD on 10/18/2010   Method used:   Electronically to        [facility]. [phone]* (retail)       [address]       [place], [place]  [address]       Ph: [phone]       Fax: [phone]   RxID:   [id number] OMEPRAZOLE 40 MG CPDR (OMEPRAZOLE) Take 1 tablet by mouth once a day  #30 x 11   Entered and Authorized by:   Edwyna Perfect MD    Signed by:   Edwyna Perfect MD on 10/18/2010   Method used:   Electronically to        [facility]. [phone]* (retail)       [address]       [place], [place]  [address]       Ph: [phone]       Fax: [phone]   RxID:   [id number]    Orders Added: 1)  TLB-CBC Platelet - w/Differential [85025-CBCD] 2)  TLB-BMP (Basic Metabolic Panel-BMET) [80048-METABOL] 3)  TLB-Hepatic/Liver Function Pnl [80076-HEPATIC] 4)  TLB-TSH (Thyroid Stimulating Hormone) [84443-TSH] 5)  TLB-Lipid Panel [80061-LIPID] 6)  T-Vitamin D (25-Hydroxy) [22297-98921] 7)  UA Dipstick w/o Micro (automated)  [81003] 8)  Specimen Handling [99000] 9)  Venipuncture [36415] 10)  TB Skin Test [86580] 11)  New Patient 65&> [19417]  Immunizations Administered:  PPD Skin Test:    Vaccine Type: PPD    Site: left forearm    Mfr: Sanofi Pasteur    Dose: 0.1 ml    Route: ID    Given by: Kyung Rudd, CMA    Exp. Date: 05/25/2012    Lot #: [id number]   Immunizations Administered:  PPD Skin Test:    Vaccine Type: PPD    Site: left forearm    Mfr: Sanofi Pasteur    Dose: 0.1 ml    Route: ID    Given by: Kyung Rudd, CMA    Exp. Date: 05/25/2012    Lot #: [id number]

## 2010-10-26 NOTE — Letter (Addendum)
Summary: [facility]   Imported By: Maryln Gottron 10/13/2010 09:35:50  _____________________________________________________________________  External Attachment:    Type:   Image     Comment:   External Document

## 2010-10-31 ENCOUNTER — Institutional Professional Consult (permissible substitution) (INDEPENDENT_AMBULATORY_CARE_PROVIDER_SITE_OTHER): Payer: Medicare Other | Admitting: Pulmonary Disease

## 2010-10-31 ENCOUNTER — Encounter: Payer: Self-pay | Admitting: Pulmonary Disease

## 2010-10-31 DIAGNOSIS — G4733 Obstructive sleep apnea (adult) (pediatric): Secondary | ICD-10-CM

## 2010-11-01 NOTE — Progress Notes (Signed)
----   Converted from flag ---- ---- 10/23/2010 8:25 AM, Edwyna Perfect MD wrote: chol high. low fat diet and regular exercise at least 3-4x/wk. vit d is low. take otc vit d 2000 units once daily. recheck vit d level (vit d deficiency) and lipid (272.4) in 2-3 months ------------------------------  Phone Note Outgoing Call   Call placed by: Kyung Rudd, CMA,  October 23, 2010 9:35 AM Call placed to: Patient Details for Reason: lab results Summary of Call: left message to call back Initial call taken by: Kyung Rudd, CMA,  October 23, 2010 9:35 AM  Follow-up for Phone Call        left another message to call back Follow-up by: Kyung Rudd, CMA,  October 24, 2010 11:59 AM  Additional Follow-up for Phone Call Additional follow up Details #1::        pt left message stating it is ok to leave a detailed message on voicemail. returned call to pt leaving detailed message about lab results Additional Follow-up by: Kyung Rudd, CMA,  October 25, 2010 2:29 PM

## 2010-11-09 NOTE — Assessment & Plan Note (Addendum)
Summary: consult for management of osa   Copy to:  Kristeen Miss Primary Provider/Referring Provider:  Dr. Rodena Medin  CC:  Sleep Consult.  History of Present Illness: the pt is a 70y/o female who I have been asked to see for management of osa.  She tells me that she was diagnosed with severe osa 3 yrs ago while living in [place], and was tried on cpap which she tolerated poorly. She used the device for no more than 3-4 mos, and tells me the primary issue was that it was "noisy" and the mask "moved around a lot while sleeping".  She never did try different types of mask.  She denies the pressure being an issue for her.  She currently has ongoing choking arousals at night, along with snoring arousals as well.  She goes to bed 10-11pm, and arises at 5am to start her day.  She has frequent awakenings at night, and is not rested in the am's upon arising.  She notes definite sleep pressure during the day with periods of inactivity, and can have some sleep pressure with driving.  Her epworth score today is abnormal at 15.  She tells me her weight is neutral over the last 2-3 years.    Current Medications (verified): 1)  Zoloft 100 Mg Tabs (Sertraline Hcl) .... One and A Half By Mouth Qd 2)  Omeprazole 40 Mg Cpdr (Omeprazole) .... Take 1 Tablet By Mouth Once A Day 3)  Aleve 220 Mg Tabs (Naproxen Sodium) .... Take 2 Tablets By Mouth Qd As Needed 4)  Ropinirole Hcl 1 Mg Tabs (Ropinirole Hcl) .... Take 1 Tab By Mouth At Bedtime 5)  Multivitamins  Tabs (Multiple Vitamin) .... Take 1 Tablet By Mouth Once A Day 6)  Calcium .... Take By Mouth Daily 7)  Vitamin D .... Take By Mouth Daily 8)  Magnesium .... Take By Mouth Daily  Allergies (verified): 1)  ! Pcn 2)  ! Cipro 3)  ! Wellbutrin  Past History:  Past Medical History:  OBESITY (ICD-278.00) HIATAL HERNIA (ICD-553.3) DIVERTICULOSIS, COLON (ICD-562.10) HYPERCHOLESTEROLEMIA (ICD-272.0) RESTLESS LEGS SYNDROME (ICD-333.94) ATRIAL FIBRILLATION  (ICD-427.31) UNSPECIFIED ESSENTIAL HYPERTENSION (ICD-401.9) OSA DIARRHEA (ICD-787.91) AVASCULAR NECROSIS (ICD-733.40) TEMPOROMANDIBULAR JOINT PAIN (ICD-524.62) OSTEOARTHRITIS (ICD-715.90) DEPRESSION (ICD-311) HEPATIC CYST (ICD-573.8) BARRETT'S ESOPHAGUS, HX OF (ICD-V12.79)    Past Surgical History: Left total hip arthroplasty Right total hip arthroplasty Laparoscopic cholecystectomy Tubal Ligation Bilateral Breast Reduction  Family History: Reviewed history from 06/20/2010 and no changes required. Family History of Colon Cancer: 3 paternal aunts Family History of Diabetes:Brother  Family History of Heart Disease: Mother rheumatism: father and fathers's side of the family strokes: brother and mother heart disease: pgm  Social History: Reviewed history from 10/18/2010 and no changes required. Alcohol Use - yes-occasional  Illicit Drug Use - no Patient does not get regular exercise.  Never Smoked pt is divorced and lives alone. pt has children. pt is a retired Runner, broadcasting/film/video.   Review of Systems       The patient complains of irregular heartbeats, acid heartburn, indigestion, difficulty swallowing, depression, joint stiffness or pain, and rash.  The patient denies shortness of breath with activity, shortness of breath at rest, productive cough, non-productive cough, coughing up blood, chest pain, loss of appetite, weight change, abdominal pain, sore throat, tooth/dental problems, headaches, nasal congestion/difficulty breathing through nose, sneezing, itching, ear ache, anxiety, hand/feet swelling, change in color of mucus, and fever.    Vital Signs:  Patient profile:   70 year old female Height:  66.5 inches Weight:      193 pounds BMI:     30.80 O2 Sat:      96 % on Room air Temp:     98.1 degrees F oral Pulse rate:   68 / minute BP sitting:   132 / 80  (left arm) Cuff size:   large  Vitals Entered By: Arman Filter LPN (October 31, 2010 3:25 PM)  O2 Flow:  Room  air CC: Sleep Consult Comments Medications reviewed with patient Arman Filter LPN  October 31, 2010 3:25 PM    Physical Exam  General:  ow female in nad  Eyes:  PERRLA and EOMI.   Nose:  mild septal deviation to left with narrowing no purulence or discharge noted. Mouth:  moderate elongation of soft palate and uvula no exudates or lesions seen Neck:  no jvd, tmg, LN Lungs:  totally clear to auscultation Heart:  sounds regular, no mrg. Abdomen:  soft and nontender, bs+ Extremities:  no significant edema, no cyanosis  pulses intact distally Neurologic:  alert and oriented, moves all 4.     Impression & Recommendations:  Problem # 1:   OBSTRUCTIVE SLEEP APNEA (ICD-327.23) The pt has a history of severe osa in the past, and has had poor tolerance of cpap.  She is willing to try cpap again, but is not overly happy about it.  I need to get a copy of her sleep study to verify her severity.  She primarily disliked having something on her face, but also had a lot of issues with leaking from her excessive movement during the night.  I think she should try nasal pillows to see if that would work better for her.  I also wonder by her history if she may have concomitant RLS??  I have also discussed with her the possibility of a dental appliance for treatment of her osa.  She understands it will not treat severe disease 100%, but may help 50% plus.  For now, will retry her on cpap with nasal pillows while waiting on her sleep study from [place].  I have also encouraged her to work on weight loss.    Medications Added to Medication List This Visit: 1)  Multivitamins Tabs (Multiple vitamin) .... Take 1 tablet by mouth once a day 2)  Calcium  .... Take by mouth daily 3)  Vitamin D  .... Take by mouth daily 4)  Magnesium  .... Take by mouth daily  Other Orders: Consultation Level IV (98119) DME Referral (DME)  Patient Instructions: 1)  will have a medical equipment company check your  machine and make sure in working order.  2)  will try nasal pillows instead of your nasal mask 3)  work on weight loss 4)  I will call you as soon as I receive your old sleep study results. 5)  please give me some feedback in a week or so after you have been back on cpap.  Will arrange followup visit once we talk again.

## 2011-02-09 NOTE — H&P (Signed)
Cassandra Miles, Cassandra [name]:  [id number]   MEDICAL RECORD NO.:  [id number]          PATIENT TYPE:  INP   LOCATION:  0005                         FACILITY:  [facility]   PHYSICIAN:  Ollen Gross, M.D.    DATE OF BIRTH:  01-Apr-1941   DATE OF ADMISSION:  12/12/2005  DATE OF DISCHARGE:                                HISTORY & PHYSICAL   DATE OF OFFICE VISIT/HISTORY AND PHYSICAL:  November 27, 2005.   CHIEF COMPLAINT:  Left hip pain.   HISTORY OF PRESENT ILLNESS:  The patient is a 70 year old female, well known  to Dr. Ollen Gross, having previously undergone a right total hip  arthroplasty back in December 2006.  She has done beautifully with regards  to her right hip.  She started doing more activities.  Unfortunately, at  this point her left hip is the limiting factor.  She was known to have  bilateral hip pain from AVN.  She has done well the right hip, continues  with left hip pain and now presents for a left hip arthroplasty.   ALLERGIES:  1.  PENICILLIN causes a rash.  2.  CIPRO causes hives.   CURRENT MEDICATIONS:  Fosamax, Lexapro, Aleve, Requip, Tylenol Arthritis.   PAST MEDICAL HISTORY:  1.  TMJ.  2.  History of right arm fracture, underwent a closed reduction.  3.  Osteoarthritis.   PAST SURGICAL HISTORY:  1.  Cholecystectomy.  2.  Bilateral cord decompressions of both hips.  3.  Recent right total hip arthroplasty.   FAMILY HISTORY:  Mother with a stroke.  Father with arthritis.   SOCIAL HISTORY:  Divorced, works as a Engineer, site.  Nonsmoker.  No  alcohol.  Two children.  She lives alone.  She can stay with an uncle after  surgery and then back to [place].   REVIEW OF SYSTEMS:  GENERAL:  No fever, chills, night sweats.  NEURO:  No  seizures, syncope, paralysis.  RESPIRATORY:  No shortness of breath,  productive cough, or hemoptysis.  CARDIOVASCULAR:  No chest pain, angina,  orthopnea.  GI:  No nausea, vomiting, diarrhea, or constipation.  GU:   No  dysuria, hematuria, discharge.  She has been seen by a urologist due to some  urologic problems.  He recommended that they leave the Foley in longer than  usual and due to allergy to Cipro, he recommended Macrobid postoperatively  if needed.  MUSCULOSKELETAL:  Left hip.   PHYSICAL EXAMINATION:  VITAL SIGNS:  Pulse 80, respirations 14, blood  pressure 136/78.  GENERAL:  A 70 year old, white female, well nourished, well developed, no  acute distress, alert, oriented, cooperative, pleasant at time of exam.  HEENT:  Normocephalic, atraumatic.  Pupils round and reactive.  Oropharynx  clear.  EOMs intact.  LUNGS:  She has clear anterior posterior chest walls.  No rhonchi, rales, or  wheezing.  HEART:  Regular rate and rhythm.  Early systolic ejection murmur, 2/6, best  heard at aortic point.  ABDOMEN:  Soft, nontender.  Bowel sounds present.  RECTAL/BREASTS/GENITALIA:  Not done.  Not pertinent to present illness.  EXTREMITIES:  Left hip:  The left hip shows range of motion and flexion of  100 degrees, internal rotation 20, external rotation 30, abduction 30.  She  does have pain noted on passive range of motion, ambulates with a slightly  antalgic gait.   IMPRESSION:  1.  Avascular necrosis, left hip.  2.  Temporomandibular joint syndrome.   PLAN:  The patient is admitted to [facility] to undergo a left  total hip arthroplasty.  Surgery will be performed by Dr. Ollen Gross.  She has seen a urologist in the past and he recommended leaving the Foley in  a little bit longer than usual and also recommended using Macrobid in the  postoperative period if needed.      Alexzandrew L. Julien Girt, P.A.      Ollen Gross, M.D.  Electronically Signed    ALP/MEDQ  D:  12/11/2005  T:  12/12/2005  Job:  [id number]

## 2011-02-09 NOTE — Op Note (Signed)
Cassandra Miles, [name]:  [id number]   MEDICAL RECORD NO.:  [id number]          PATIENT TYPE:  INP   LOCATION:  1505                         FACILITY:  [facility]   PHYSICIAN:  Ollen Gross, M.D.    DATE OF BIRTH:  Aug 14, 1941   DATE OF PROCEDURE:  12/12/2005  DATE OF DISCHARGE:                                 OPERATIVE REPORT   PREOPERATIVE DIAGNOSIS:  Avascular necrosis left hip.   POSTOPERATIVE DIAGNOSIS:  Avascular necrosis left hip.   PROCEDURE:  Left total hip arthroplasty.   SURGEON:  Ollen Gross, M.D.   ASSISTANT:  Avel Peace, PA-C.   ANESTHESIA:  General.   ESTIMATED BLOOD LOSS:  500.   DRAINS:  Hemovac x1.   COMPLICATIONS:  None.   CONDITION:  Stable to recovery.   CLINICAL NOTE:  Ms. Cassandra Miles is a 70 year old female who had significant  osteonecrosis of both hips now status post a right total hip arthroplasty.  She did great on that side and now has incapacitating pain in the left hip.  She presents now for left total hip arthroplasty.   PROCEDURE IN DETAIL:  After successful administration of general anesthetic,  the patient's placed in the right lateral decubitus position with the left  side up and held with the hip positioner. The left lower extremity is  isolated from the perineum with plastic drapes and prepped and draped in the  usual sterile fashion. A standard posterolateral incision was made with a 10  blade through the subcutaneous tissue to the level of the fascia lata which  was incised in line with the skin incision. The sciatic nerve was palpated  and protected and the short rotators isolated off the femur. Capsulectomy  was performed and the hip was dislocated. The trial prosthesis is placed  such that the center of the trial head corresponds to the center of her  native femoral head. The osteotomy line is marked on the femoral neck and  osteotomy made with an oscillating saw. The femoral head is removed and then  the  femur retracted anteriorly to gain acetabular exposure.   Acetabular retractors are placed and then the labrum is removed. Reaming  starts at 47 mm coursing in increments of 2 up to 53 mm and then a 54 mm  pinnacle acetabular shell was placed in anatomic position and transfixed  with two dome screws with excellent purchase. A trial 36 mm neutral liner  was placed.   The femur was addressed with the canal finder and irrigation. Axial reaming  was performed up to a 15.5 mm, proximal reaming to 20D and the sleeve  machined to a small. A 20D small trial sleeve is placed with 20 x 15 stem  and a 36 plus 8 neck. The anteversion was about 10 degrees beyond her native  anteversion for total of about 20 degrees. The 36 plus 0 trial head is  placed and the hip is reduced with great stability. She had full extension,  full external rotation, 70 degrees flexion, 40 degrees adduction, 90 degrees  internal rotation and 90 degrees flexion, and  70 degrees internal rotation.  By placing the left leg on top of the right, I felt as though the leg  lengths were equal. Hip is then dislocated and all trials are removed.   The apex hole eliminator was placed into the acetabular shell and then a 36  mm neutral Ultamet metal liner was placed. This is a metal-on-metal hip  replacement. On the femoral side, the 20D small sleeve is placed with a 20 x  15 stem, 36 plus 8 neck about 10 degrees beyond her native anteversion. The  permanent 36 plus 0 head is placed and the hip is reduced with the same  stability parameters. The wound was copiously irrigated with saline solution  and the short rotators reattached to the femur through drill holes. The  fascia lat was closed over a Hemovac drain with interrupted #1 Vicryl, subcu  closed with #1-0 and #2-0 Vicryl and subcuticular with running 4-0 Monocryl.  The incision is cleaned and dried and Steri-Strips and a bulky sterile  dressing applied. The drain is hooked to  suction and then she is placed into  a knee immobilizer, awakened and transported to recovery in stable  condition.      Ollen Gross, M.D.  Electronically Signed     FA/MEDQ  D:  12/12/2005  T:  12/13/2005  Job:  [id number]

## 2011-02-09 NOTE — Discharge Summary (Signed)
Cassandra Miles, Cassandra [name]:  [id number]   MEDICAL RECORD NO.:  [id number]          PATIENT TYPE:  INP   LOCATION:  1505                         FACILITY:  [facility]   PHYSICIAN:  Ollen Gross, M.D.    DATE OF BIRTH:  [birth year]   DATE OF ADMISSION:  12/12/2005  DATE OF DISCHARGE:  12/17/2005                                 DISCHARGE SUMMARY   ADMITTING DIAGNOSES:  1.  Avascular necrosis left hip.  2.  Temporomandibular joint syndrome.   DISCHARGE DIAGNOSES:  1.  Avascular necrosis left hip status post left total hip arthroplasty.  2.  Postoperative blood loss anemia.  3.  Status post transfusion without sequelae.  4.  Postoperative hyponatremia, improved.   PROCEDURE:  On December 12, 2005, left total hip arthroplasty.   SURGEON:  Ollen Gross, M.D.   ASSISTANT:  Alexzandrew L. Perkins, P.A.-C.   HEMOVAC DRAIN:  Times one.   BLOOD LOSS:  500 mL.   CONSULTS:  Rehab services.   BRIEF HISTORY:  Ms. Aurich is a 70 year old female with significant  osteonecrosis of both hips.  She is now status post right total hip, doing  well on that side.  The left hip has been increasing in pain, now is ready  to have the other side completed.   LABORATORY DATA:  Preop CBC:  Hemoglobin 11.4, hematocrit 34.3, white cell  count normal at 9.2, postop hemoglobin 8.9, drifting down to 8.8.  Given  blood and post-transfusion hemoglobin back up to 9.9 with the crit at 29.3.  PT, PTT preoperatively 13.9 and 36 respectively with an INR of 1.0.  Serial  prothrombin times followed and showed PT/INR 26.6/2.4.  Chem panel on  admission:  Low albumin of 3.1, remaining Chem panel all within normal  limits.  Serial BMETs were followed.  Sodium dropped from 138 to 132, back  to 138.  Remaining electrolytes remained within normal limits.  Glucose went  up from 117 to 130, now back down to 117.  Preop. UA cloudy, small  hemoglobin, large leukocyte esterase with 7-10 white cells, 0-2 red  cells,  few epithelial cells, few bacteria, treated preoperatively.  Repeat UA amber  color, small hemoglobin, trace leukocyte esterase with only 0-2 red, 0-2  white cells.  Blood type A positive.  Urine culture no growth.  Left hip  films December 10, 2005:  Status post right total hip, moderately severe  osteoarthritis involving left hip.   HOSPITAL COURSE:  Patient admitted to [facility], tolerated  procedure well.  Later transferred to the recovery room and the orthopedic  floor.  Started on PCA and p.o. analgesia for pain control following  surgery.  Given 24 hours postop IV antibiotics.  A Hemovac drain placed x3  was pulled on day 1.  Had a fair amount of pain on the evening of surgery  with a rough night, still a little bit better on the morning after surgery  utilizing pain pills and topical PCA.  Urine was sent for culture as above.  Hemovac drain placed x3 was pulled.  Had excellent output  following surgery,  been only 200 since midnight.  Given fluid boluses.  PCA was discontinued,  hemoglobin had dropped down to 8.9.  She did respond with better output from  urinary side after the fluid bolus.  Patient was seen in consultation by  rehab services postoperatively and felt that possible inpatient stay would  be required and they followed along with the hospital course.  Minimal 5  steps on postop day 1.  By day 2, the patient's hemoglobin was down to 8.8,  she was feeling symptomatic, being tired and weak.  Discussed blood  transfusion.  Received 2 units of blood with immediate improvement.  Therapy  did some bed exercises but held ambulation that morning, attempted up out of  bed later that afternoon, only ambulated about 12 feet, slowly progressing.  By the following day, day 3, was doing much better, dressing had been  changed, incision was healing well, started to get up and by day 3 was  ambulating already 80 feet.  It was felt they were progressing well enough  they  would not require inpatient stay and possibly would be discharged home  over the weekend.  Continued to receive therapy each day, weaned her of the  p.o. meds and by December 17, 2005, had been weaned of the p.o. meds, improved  after the transfusion, maintained her hip precautions, was discharged home  later that day.   DISCHARGE PLAN:  1.  The patient discharged home on December 17, 2005.  2.  Discharge diagnoses, please see above.  3.  Discharge meds:  Coumadin, Trinsicon, Percocet, Robaxin, diet as      tolerated.   FOLLOWUP:  Two weeks.   ACTIVITY:  Partial weight bearing 25-50%.  Home health PT and [facility] nursing.  Equipment as needed.   DISPOSITION:  Home.   CONDITION UPON DISCHARGE:  Improved.      Alexzandrew L. Julien Girt, P.A.      Ollen Gross, M.D.  Electronically Signed    ALP/MEDQ  D:  01/23/2006  T:  01/24/2006  Job:  [id number]   cc:   Ollen Gross, M.D.  Fax: [phone]

## 2011-02-09 NOTE — Discharge Summary (Signed)
NAMEKESSLER, KOPINSKI NO.:  [id number]   MEDICAL RECORD NO.:  [id number]          PATIENT TYPE:  INP   LOCATION:  1507                         [facility]AC[facility]   PHYSICIAN:  Ollen Gross, M.D.    DATE OF BIRTH:  October 01, 1940   DATE OF ADMISSION:  08/27/2005  DATE OF DISCHARGE:  09/01/2005                                 DISCHARGE SUMMARY   ADMISSION DIAGNOSES:  1.  Avascular necrosis, bilateral hips, right more symptomatic than left.  2.  Temporomandibular joint.  3.  Osteoarthritis.   DISCHARGE DIAGNOSES:  1.  Avascular necrosis, right hip, status post right total hip arthroplasty.  2.  Postoperative blood loss anemia.  3.  Status post transfusion without sequelae.  4.  Hyponatremia, improved.  5.  Urinary tract infection.  6.  Temporomandibular joint.  7.  Osteoarthritis.   PROCEDURE:  On August 27, 2005, right total hip arthroplasty.   SURGEON:  Ollen Gross, M.D.   ASSISTANT:  Alexzandrew L. Perkins, P.A.-C.   ANESTHESIA:  General.   BLOOD LOSS:  350 cc.   BRIEF HISTORY:  Ms. Bastarache is a 70 year old female with severe  osteonecrosis, bilateral hips.  Right currently is more symptomatic than the  left.  She has had a cord decompression performed in [place].  Unfortunately, the pain is progressive and has gotten worse.  Now presents  for total hip.   Preop CBC:  Hemoglobin 10.8, hematocrit 32.7, white cell count 8.9, red cell  count 3.7.  Postop hemoglobin down to 7.6.  Given blood.  Post-transfusion  hemoglobin 9.1.  Last noted H&H 9.9 and 29.1.  PT/PTT preop 13.9 and 38,  respectively.  INR 1.1.  Serial pro times followed.  PT/INR 28.7 and 2.7.  Chem panel on admission:  Low sodium of 134, low albumin of 2.9.  Remaining  chem panel within normal limits.  Serial BMETs are followed.  Sodium came up  to 135.  The remaining electrolytes remained within normal limits.  Preop  UA:  Large leukocyte esterase, a few epithelials, 11-20 white cells,  few  bacteria.  Follow-up UA:  Large hemoglobin, positive nitrites, moderate  leukocyte esterase, too-numerous-to-count white cells, 21-50 red cells, many  bacteria.  Blood group type A-.  Urine culture positive for E. coli.   EKG on August 23, 2005:  Normal sinus rhythm.  Left axis deviation.  When  compared, no significant changes since last tracing.  Confirmed by Dr.  Nanetta Batty.   A two view chest on August 23, 2005:  No active disease.   Right hip films on August 23, 2005:  Moderate degenerative changes, right  hip.  Also changes in the left hip, particularly the left femoral head with  the suggestive of avascular necrosis.   Portal hip and pelvis films on August 27, 2005:  Acetabular component well  positioned.  Visualized femoral component, well positioned.  No identifiable  complications.   HOSPITAL COURSE:  Admitted to [facility], tolerated the procedure  well, and was later transferred to the recovery room and then to the  orthopedic floor.  Seen on rounds.  Noted to have a low hemoglobin with some  asymptomatic hypotension, felt to be due to anemia and acute blood loss.  Recommended transfusion.  Given blood.  Started to get up with physical  therapy.  By day #2, blood pressure still low but the hemoglobin had greatly  improved back up to 9.1.  Dressings changed.  Incision was healing well.  DC  the PCA and the fluids.  From a therapy standpoint, started getting up and  ambulating short distances.  By day #3, Foley had come out, but the patient  had already been catheterized twice.  The patient had been seen by rehab  services and felt to be an appropriate candidate for rehab and was waiting  on insurance approval.  The incision was healing well.  Checked with the  patient's insurance, [employer].  The state plan says  they do not cover any rehab for a single joint replacement.  Unfortunately,  the patient had to be cathed a  third time that day, and the Foley was  replaced.  By day #4, the patient was starting to progress with physical  therapy.  Unfortunately, due to insurance reasons, was unable to look into  any kind of rehab, [facility], or skilled nursing facility due to denial from the  insurance company.  This was discussed, and the patient understood.  They  started making arrangements for home assistance.  Foley had to be placed  back in.  It was decided that the Foley would be left in at the time of  discharge.  She had to do this before with the previous surgery and would  have the home health nurse remove the Foley at a later date.  It was found  that she had a urinary tract infection and was placed on Septra.  Once the  patient had progressed from an orthopedic standpoint enough to ambulate 100  feet, after staying in the hospital an extra couple of days, by December 9,  she was doing well, had gotten up with physical therapy and was ready to be  discharged home.   DISCHARGE PLAN:  1.  Patient was discharged home on September 01, 2005.  2.  Discharge diagnoses:  Please see above.  3.  Discharge meds:  Coumadin, Percocet, Robaxin, Septra.  4.  Diet:  As tolerated.  5.  Follow up two weeks from surgery.  6.  Activity:  Partial weightbearing, right lower extremity.  Home health      PT/OT and home health nursing.  Gait training ambulation.  Hip      precautions.   DISPOSITION:  Home.   CONDITION ON DISCHARGE:  Improving.      Alexzandrew L. Julien Girt, P.A.      Ollen Gross, M.D.  Electronically Signed    ALP/MEDQ  D:  10/22/2005  T:  10/23/2005  Job:  [id number]   cc:   Rehab Services

## 2011-02-09 NOTE — Consult Note (Signed)
NAMELEVADA, BOWERSOX                          ACCOUNT NO.:  [id number]   MEDICAL RECORD NO.:  [id number]                   PATIENT TYPE:  EMS   LOCATION:  ED                                   FACILITY[facility]  [facility]   PHYSICIAN:  Currie Paris, M.D.           DATE OF BIRTH:  [birth year]   DATE OF CONSULTATION:  02/11/2003  DATE OF DISCHARGE:                                   CONSULTATION   CHIEF COMPLAINT:  Abdominal pain.   HISTORY OF PRESENT ILLNESS:  Ms. Stahlecker is a 70 year old lady who presented  to the emergency room with abdominal pain which is pan-like epigastrium  going into primarily the right upper quadrant, but a little bit into the  left upper quadrant associated with some nausea.  She said is appeared a  little while after eating a chocolate frosty from [facility].  She is now  feeling better, but has had pain medication.  She notes that she has had  numerous episodes of vomiting tonight.  She really did not find anything  that would get her pain to get better, it was fairly constant.  Nothing  really seemed to effect it significantly.  She notes in the past that she  has had some intermittent epigastric pain, but never like this, and can  generally do anything that she wants without problems.   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   MEDICATIONS:  Celexa.   ALLERGIES:  PENICILLIN.   HABITS:  Smokes none, alcohol occasional.   FAMILY HISTORY:  Positive for gallstones.   REVIEW OF SYMPTOMS:  HEENT:  The patient has had no significant HEENT  symptoms.  CHEST:  No history of cough, shortness of breath, pulmonary  problems.  HEART:  No history of significant symptoms, although she at one  point had some hypertension, never treated, and apparently resolved.  GASTROINTESTINAL:  Negative, except for today.  GENITOURINARY:   Normal  urination, no menstrual symptoms, vaginal discharge, etc.  EXTREMITIES:  Has  some superficial varicosities seen on her legs.   PHYSICAL EXAMINATION:  GENERAL:  The patient is a generally healthy-  appearing 70 year old, alert, awake, and comfortable.  VITAL SIGNS:  Blood pressure 148/80, temperature 98, pulse 77, respirations  16.  HEENT:  Head is normocephalic.  Eyes are nonicteric.  Pupils equal, round,  and regular.  Pharynx is normal mucous membranes moist.  NECK:  Supple, no masses or thyromegaly.  CHEST:  Clear to auscultation.  Normal respirations.  HEART:  Regular, no murmurs, rubs, or gallops heard.  Normal pulses.  ABDOMEN:  Generally soft.  There is a well-healed umbilical scar.  She has  very minimal tenderness in the right upper quadrant.  There is no guarding  or rebound present.  Bowel sounds  are normal.  EXTREMITIES:  No cyanosis or edema.  She does have superficial varicosities.   LABORATORY DATA:  White blood cell count 10,000.  Slight elevation of  bilirubin and alkaline phosphatase.  Ultrasound shows  a couple of small  gallstones, but no evidence of acute cholecystitis.   IMPRESSION:  Biliary colic with gallstones.   PLAN:  Discussed options with the patient, and offered her today to try to  schedule surgery later on.  However, she is feeling better, and really like  to try to schedule surgery electively.  I told her that we could attempt to  do that, and she will call the office later in the day when the office is  open and set up a time.  I went over the surgery with her today.  I also  told her that we would just like to recheck some labs on her Friday to make  sure liver functions are not getting worse, and that it would suggest a more  formal urgent intervention, and that should she have any recurrent or  increase of her pain, we would need to go ahead with cholecystectomy more  urgently rather then in a more elective mode.                                                Currie Paris, M.D.    CJS/MEDQ  D:  02/11/2003  T:  02/11/2003  Job:  [id number]

## 2011-02-09 NOTE — H&P (Signed)
Cassandra Miles, Cassandra Miles                          ACCOUNT NO.:  [id number]   MEDICAL RECORD NO.:  [id number]                   PATIENT TYPE:  INP   LOCATION:  0001                                 FACILITY:  [facility]   PHYSICIAN:  John C. Madilyn Fireman, M.D.                 DATE OF BIRTH:  Nov 25, 1940   DATE OF ADMISSION:  02/12/2003  DATE OF DISCHARGE:                                HISTORY & PHYSICAL   CHIEF COMPLAINT:  Abdominal pain and fever.   HISTORY OF PRESENT ILLNESS:  The patient is a 70 year old white female who  presented with sudden onset of epigastric abdominal pain, nausea and  vomiting two night ago and went to the emergency room where she was found to  have gallstones on ultrasound. Apparently at that time with normal liver  function tests. She was seen by  Dr. Jamey Ripa and set up for elective cholecystectomy. She felt better after a  dose of Dilaudid and wanted to go home at that time. She came in for lab  work today and was found to have a bilirubin of 4 and other elevated liver  function tests as well. Dr. Jamey Ripa called me to consider ERCP. At that  point, the patient came to my office and was seen by my nurse and had a  temperature of 103 and was referred to [facility] for ERCP and  admission. The patient states that she has had minimal left upper quadrant  pain since her initial attack of epigastric pain but has generally been  feeling poorly and has had intermittent chills yesterday afternoon and this  afternoon with one episode of vomiting today.   PAST MEDICAL HISTORY:  Depression.   PAST SURGICAL HISTORY:  Tubal ligation.   MEDICATIONS:  Celexa.   ALLERGIES:  PENICILLIN.   FAMILY HISTORY:  Mother is alive at age 25. Father died in an auto accident.  No family history of GI malignancy or gallstones.   SOCIAL HISTORY:  The patient is a divorced Engineer, site. She denies  tobacco use and occasionally drinks alcohol.   PHYSICAL EXAMINATION:  GENERAL: A  well-developed, well-nourished white  female in no acute distress.  VITAL SIGNS: Temperature 103 in my office. Other vital signs stable.  HEENT: Minimal scleral icterus.  HEART: Regular rate and rhythm.  Without murmur.  LUNGS: Clear.  ABDOMEN: Soft. Nondistended with normal active bowel sounds. No  hepatosplenomegaly, masses or guarding. There is minimal epigastric  tenderness.   LABORATORY DATA:  Pending.   IMPRESSION:  Known gallstones with likely choledocholithiasis and probably  ascending cholangitis.   PLAN:  The patient will undergo urgent ERCP after being given IV antibiotics  in the form of Cipro. Risks, rationale, and alternatives to ERCP prior to  laparoscopic cholecystectomy were discussed with the patient. She wishes to  proceed.  John C. Madilyn Fireman, M.D.    JCH/MEDQ  D:  02/12/2003  T:  02/12/2003  Job:  [id number]   cc:   Currie Paris, M.D.  [address], [address]  [place]  [place] [address]  Fax: [phone]   Talmadge Coventry, M.D.  [address], [address]  [place]  [place] [address]  Fax: [phone]

## 2011-02-09 NOTE — Op Note (Signed)
[name], Cassandra Miles                          ACCOUNT NO.:  [id number]   MEDICAL RECORD NO.:  [id number]                   PATIENT TYPE:  INP   LOCATION:  0474                                 FACILITY:  [facility]   PHYSICIAN:  John C. Madilyn Fireman, M.D.                 DATE OF BIRTH:  09-11-1941   DATE OF PROCEDURE:  DATE OF DISCHARGE:                                 OPERATIVE REPORT   PROCEDURE:  Endoscopic retrograde cholangiopancreatography with  sphincterotomy and stent placement.   PREMEDICATION:  8 mg of IV Versed, 87.5 mcg fentanyl.   DESCRIPTION OF PROCEDURE:  The Olympus video side-viewing endoscope was  advanced blindly into the lower pharynx, esophagus and stomach.  The stomach  appeared normal.  The duodenum was entered and the papilla of Vater located  on the medial duodenal wall.  It had a normal appearance and was cannulated  with the Wilson-Cook sphincterotome.  The cholangiogram was obtained which  showed a smooth, tapered, slightly dilated common bile duct with filling of  the cystic duct and gallbladder to some degree and some filling of the  anterior hepatic ducts.  I did not see a stone at the beginning of the  procedure, but based on the clinical scenario, including fever and known  gallstones as well as elevated liver function tests and jaundice, I went  ahead and performed a 1 cm sphincterotomy.  This did not immediately result  in any flow of bile through the ampulla.  We then passed a 12 mm balloon  catheter and while this was being advanced, at one point we saw two or three  round filling defects that were not seen earlier and these were felt to  either represent air bubbles or possibly stones.  The 12 mm balloon catheter  was inflated and dragged through the ampulla with some bubbles seen to be  delivered but no definite stones seen.  A little bit of clear amber bile  came out which was not turbid or murky in appearance.  Several more balloon  sweeps were made  with similar results and no definite stones seen.  At the  end of the last one, a good occlusion cholangiogram was obtained with the  balloon inflated very distally.  The balloon was then deflated and withdrawn  and cholangiogram obtained which gave a good outline of the entire common  bile duct.  No filling defects were seen.  However, I did not see  significant bile exit the ampulla and the dye appeared to remain retained in  the common bile duct over about a five-minute period.  Based on her  documented fever of 103, I decided at that point to place a 5 cm 11.5-French  plastic stent with the Oasis system and this was done with good exit of  clear amber bile.  Scope was then withdrawn and the patient returned to the  recovery  room in stable condition.  She tolerated the procedure well and  there were no immediate complications.   IMPRESSION:  Questionable gallstones versus air bubbles, status post  sphincterotomy and stent placement.   PLAN:  Continue IV antibiotics prior to laparoscopic cholecystectomy.                                               John C. Madilyn Fireman, M.D.   JCH/MEDQ  D:  02/12/2003  T:  02/13/2003  Job:  [id number]   cc:   Currie Paris, M.D.  [address], [address]  [place]  [place] [address]  Fax: [phone]

## 2011-02-09 NOTE — H&P (Signed)
Cassandra Miles, Cassandra [name]:  [id number]   MEDICAL RECORD NO.:  [id number]          PATIENT TYPE:  INP   LOCATION:  NA                           FACILITY:  [facility]   PHYSICIAN:  Ollen Gross, M.D.    DATE OF BIRTH:  1941-04-04   DATE OF ADMISSION:  08/27/2005  DATE OF DISCHARGE:                                HISTORY & PHYSICAL   DATE OF OFFICE VISIT HISTORY AND PHYSICAL:  August 23, 2005.   CHIEF COMPLAINT:  Bilateral hip pain, right greater than left.   HISTORY OF PRESENT ILLNESS:  A 70 year old female who has been seen by Ollen Gross, M.D. for ongoing bilateral hip pain.  The right is more troublesome  and symptomatic than the left.  She has been treated by Dr. Joanne Gavel in  [place], [place].  She has undergone cord decompression for  diagnosis of early osteonecrosis, unfortunately she did not get much benefit  with the cord decompression.  The pain has gotten progressively worse.  She  is seen in the office.  Her x-rays show significant osteonecrosis in both  femoral heads with significant joint space loss, slightly worse on the left  than the right.  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  Risks and benefits were discussed.  The patient is  subsequently admitted to the hospital.   ALLERGIES:  PENICILLIN causes a rash.   CURRENT MEDICATIONS:  1.  Fosamax.  2.  Lexapro.  3.  Hydrocodone.   PAST MEDICAL HISTORY:  TMJ.  History of right arm fracture for which she  underwent a closed reduction.  Osteoarthritis.   PAST SURGICAL HISTORY:  Cholecystectomy and the bilateral core  decompressions of both hips.   FAMILY HISTORY:  Mother with history of stroke.  Family history also of  arthritis.   SOCIAL HISTORY:  Divorced.  Works as a Engineer, site.  Nonsmoker.  No  alcohol.  Two children.  She lives alone and wants to look into rehab.   REVIEW OF SYSTEMS:  GENERAL:  No fevers, chills, or night sweats.  NEUROLOGY:  No seizures, syncope, or paralysis.  RESPIRATORY:  No shortness  of breath, productive cough, or hemoptysis.  CARDIOVASCULAR:  No chest pain,  angina, or orthopnea.  GASTROINTESTINAL:  No nausea, vomiting, diarrhea, or  constipation.  GENITOURINARY:  No dysuria, hematuria, or discharge.  MUSCULOSKELETAL:  Bilateral hips, right more symptomatic than left.   PHYSICAL EXAMINATION:  VITAL SIGNS:  Pulse 64, respirations 12, blood  pressure 118/68.  GENERAL:  A 70 year old white female, well-developed, well-nourished, in no  acute distress.  She is alert, oriented, and cooperative.  Accompanied by  her friend.  HEENT:  Normocephalic and atraumatic.  Pupils equal, round, and reactive to  light.  EOM's intact.  Oropharynx clear.  NECK:  Supple.  CHEST:  Clear anterior and posterior chest walls.  No rhonchi, rales, or  wheezing.  HEART:  Regular rate and rhythm, no murmur.  ABDOMEN:  Soft and nontender.  Bowel sounds are present.  RECTAL: BREASTS:  GENITOURINARY:  Not done, not pertinent to present  illness.  EXTREMITIES:  Right hip shows flexion of 100 degrees, internal rotation and  external rotation of 20 to 30 respectively with an abduction of 30.  She  does ambulate with an antalgic gait.  Left hip has very similar motion.   IMPRESSION:  Avascular necrosis of bilateral hips, right more symptomatic  than left.   PLAN:  The patient will be admitted to [facility] to  undergo a right total hip arthroplasty.  The surgery will be performed by  Dr. Lequita Halt.      Alexzandrew L. Julien Girt, P.A.      Ollen Gross, M.D.  Electronically Signed    ALP/MEDQ  D:  08/26/2005  T:  08/26/2005  Job:  [id number]

## 2011-02-09 NOTE — Op Note (Signed)
   NAMEHARLO, JASO                          ACCOUNT NO.:  [id number]   MEDICAL RECORD NO.:  [id number]                   PATIENT TYPE:  AMB   LOCATION:  ENDO                                 FACILITY:  [facility]   PHYSICIAN:  John C. Madilyn Fireman, M.D.                 DATE OF BIRTH:  11-13-1940   DATE OF PROCEDURE:  03/16/2003  DATE OF DISCHARGE:                                 OPERATIVE REPORT   PROCEDURE:  Esophagogastroduodenoscopy with removal of biliary stent.   INDICATION FOR PROCEDURE:  Patient with recent cholecystectomy and suspected  common bile duct stone, who had a temporary biliary stent placed.  She has  done well with normalization of liver function tests.  The procedure is to  remove the biliary stent.   PROCEDURE:  The patient was placed in the prone position and placed on the  pulse monitor with continuous low-flow oxygen delivered by nasal cannula.  She was sedated with 75 mcg IV fentanyl and 7.5 mg IV Versed.  The Olympus  side-viewing endoscope was advanced blindly into the oropharynx, esophagus,  stomach.  The stomach appeared grossly normal.  The pylorus was traversed  and the papilla of Vater located on the duodenal wall with a blue biliary  stent protruding from it.  It was grasped with the snare and removed  together with the endoscope.  No cholangiogram was performed.  The patient  was then returned to the recovery room in stable condition.  She tolerated  the procedure well, and there were no immediate complications.   IMPRESSION:  Successful removal of previously-placed biliary stent,  otherwise normal endoscopy.                                               John C. Madilyn Fireman, M.D.    JCH/MEDQ  D:  03/16/2003  T:  03/16/2003  Job:  [id number]   cc:   Lorre Munroe., M.D.  Fax: [phone]   Talmadge Coventry, M.D.  [address]  [place]  [place] [address]  Fax: [phone]

## 2011-02-09 NOTE — Op Note (Signed)
Cassandra Miles, Cassandra Miles                          ACCOUNT NO.:  [id number]   MEDICAL RECORD NO.:  [id number]                   PATIENT TYPE:  INP   LOCATION:  0474                                 FACILITY:  [facility]   PHYSICIAN:  Lorre Munroe., M.D.            DATE OF BIRTH:  [birth year]   DATE OF PROCEDURE:  02/13/2003  DATE OF DISCHARGE:                                 OPERATIVE REPORT   PREOPERATIVE DIAGNOSIS:  Symptomatic gallstones.   POSTOPERATIVE DIAGNOSIS:  Symptomatic gallstones.   OPERATION:  Laparoscopic cholecystectomy.   SURGEON:  Zigmund Daniel, M.D.   ASSISTANT:  Adolph Pollack, M.D.   ANESTHESIA:  General.   DESCRIPTION OF PROCEDURE:  After the patient was monitored and anesthetized  and had routine preparation and draping of the abdomen, I infused local  anesthetic just below the umbilicus and made a transverse incision about 2  cm long, dissected down to the fascia, and opened it longitudinally in the  midline, then bluntly opened the peritoneum.  I placed 0 Vicryl pursestring  suture in the fascia and secured a Hasson cannula and inflated the abdomen  with CO2.  No abnormalities were noted except for numerous small cysts on  the liver and some edema of the gallbladder from adhesions of omentum to the  undersurface of the gallbladder.  After anesthetizing three additional sites  and placing three additional ports and positioning of the patient head-up,  foot-down, and tilted to the left, I retracted the fundus of the gallbladder  toward the right shoulder and took down the adhesions.  Then I identified  the infundibulum of the gallbladder.  I traced it downward until I saw the  cystic duct clearly emerging.  I clipped the cystic duct with four clips and  cut between the two closest to the gallbladder.  I then identified and  similarly clipped and divided the cystic artery.  I then used a hook-shaped  dissector with cautery to dissect the gallbladder  from its fossa.  No  accessory ducts were seen, and no additional arteries were noted.  Hemostasis was excellent.  The grasper accidentally made a hole in the  gallbladder, and some stones spilled out, and I removed those with the  section and with the scoop.  After detaching the gallbladder from the liver,  I placed it in a plastic pouch and reserved it above the liver, where I  copiously irrigated the right upper quadrant and removed the irrigant.  Hemostasis was good, and there was no leakage of bile, and the clips  appeared to be secure.  I then examined the pelvis, and the ovaries were not  evident.  There appeared to perhaps be a bladder diverticulum, but there was  no inflammation at all in the pelvis.  The right upper quadrant again was  inspected and looked fine.  I removed the  gallbladder through the umbilical incision and tied  the pursestring suture.  I removed the lateral ports under direct vision, then allowed the CO2 to  escape and removed the epigastric port.  I closed the skin incisions with  intracuticular 4-0 Vicryl and Steri-Strips.  The patient tolerated the  operation well.                                               Lorre Munroe., M.D.    WB/MEDQ  D:  02/13/2003  T:  02/13/2003  Job:  [id number]   cc:   Everardo All. Madilyn Fireman, M.D.  [address]  [place]  [place] [address]  Fax: [phone]

## 2011-02-09 NOTE — Op Note (Signed)
NAMEELLIET, GOODNOW NO.:  [id number]   MEDICAL RECORD NO.:  [id number]          PATIENT TYPE:  INP   LOCATION:  X003                         FACILITY:  [facility]   PHYSICIAN:  Ollen Gross, M.D.    DATE OF BIRTH:  November 06, 1940   DATE OF PROCEDURE:  08/27/2005  DATE OF DISCHARGE:                                 OPERATIVE REPORT   PREOPERATIVE DIAGNOSIS:  Avascular necrosis, right hip.   POSTOPERATIVE DIAGNOSIS:  Avascular necrosis, right hip.   PROCEDURE:  Right total hip arthroplasty.   SURGEON:  Ollen Gross, M.D.   ASSISTANT:  Avel Peace, PA-C.   ANESTHESIA:  General.   ESTIMATED BLOOD LOSS:  350.   DRAINS:  Hemovac x1.   COMPLICATIONS:  None.   CONDITION:  Stable to the recovery room.   CLINICAL NOTE:  Ms. Schraeder is a 70 year old female with severe  osteonecrosis, both hips, right currently more symptomatic than the left.  She has had a cord decompression procedure performed in [place].  Unfortunately, her pain is getting progressively worse.  She presents now  for right total hip arthroplasty.   PROCEDURE IN DETAIL:  After successful administration of general anesthetic,  the patient was placed in the left lateral decubitus position with the right  side up and held with a hip positioner.  The right lower extremity was  isolated from her perineum with plastic drapes and prepped and draped in the  usual sterile fashion.  A standard posterolateral incision was made with a  10 blade through the subcutaneous tissue to the level of the fascia lata,  which was incised in line with the skin incision.  The sciatic nerve was  palpated and protected.  Short rotators were isolated off the femur.  A  capsulectomy was performed, and the hip was dislocated.  The center of the  femoral head is marked.  The trial prosthesis is placed such that the center  of the trial head corresponds to the center of her native femoral head.  The  osteotomy line is marked  on the femoral neck, and osteotomy made with an  oscillating saw.  The femoral head is removed, and then the femur retracted  anteriorly to gain acetabular exposure.  The acetabular labrum is removed.  We do not have any osteophyte formation.  Acetabular reaming starts at 45  mm, coursing in increments of 2 up to 53 mm, then a 54 mm Pinnacle  acetabular shell is placed in anatomic position and transfixed with two dome  screws.  A trial 36 mm neutral liner is placed.   The femur is prepared, first with the canal finder, then irrigation.  Axial  reaming is performed up to 15.5 mm.  Proximal reaming up to a 20D, and the  sleeve machine to a small.  A 20D small trial sleeve is placed with a 20 x  15 stem and a 36+8 neck.  Anteversion of about 10 degrees beyond her normal  anteversion, to give a total of about 20 degrees.  We then placed the 36+0  trial head, and with the +0,  there is a little bit of soft tissue laxity, so  we went to a 36+6, which corrected the soft tissue laxity.  She had great  stability with full extension and full external rotation, 70 degrees  flexion, 40 degrees adduction, 90 degrees internal rotation, then 90 degrees  of flexion and 70 degrees of internal rotation.  By placing the right leg on  top of the left, it was felt as though leg lengths were equal.  The hip is  then dislocated, and all trials are removed.  The permanent Apex hole  eliminator is placed into the acetabular shell, and then the permanent 36 mm  neutral Ultramet metal liner is placed.  This is a metal-on-metal hip  replacement.  The permanent 20D small sleeve is placed into the femur, then  the 20 x 15 stem and 36+8 neck is placed, again, about 10 degrees beyond her  native anteversion.  The 36+6 head is placed, and then the hip is reduced  with the same stability parameters.  The wound is copiously irrigated with  saline solution, and the short rotators are reattached at the femur through  drill  holes.  The fascia lata is closed over a Hemovac drain with  interrupted #1 PDS.  The subcu is closed with #1 and then 2-0 Vicryl.  Then  subcuticular closed with running 4-0 Monocryl.  A drain is hooked to  suction.  Incision cleaned and dried.  Steri-Strips and a bulky sterile  dressing applied.  She is then awakened and transported to recovery in  stable condition.      Ollen Gross, M.D.  Electronically Signed     FA/MEDQ  D:  08/27/2005  T:  08/27/2005  Job:  [id number]

## 2011-02-09 NOTE — Discharge Summary (Signed)
   NAMESHANNAN, SLINKER                          ACCOUNT NO.:  [id number]   MEDICAL RECORD NO.:  [id number]                   PATIENT TYPE:  INP   LOCATION:  0474                                 FACILITY:  [facility]   PHYSICIAN:  Lorre Munroe., M.D.            DATE OF BIRTH:  October 01, 1940   DATE OF ADMISSION:  02/12/2003  DATE OF DISCHARGE:  02/14/2003                                 DISCHARGE SUMMARY   HISTORY:  A 70 year old white female with biliary colic by clinical  evaluation and gallstones on ultrasound.  She was admitted to the hospital  with fever and jaundice.  Bilirubin level was 4.1.  White count 13,000.  Her  general health is good.  There is a history of depression.  She has had a  tubal ligation and she is allergic to penicillin.  See history and physical  for further details.   PHYSICAL EXAMINATION:  ABDOMEN:  Mildly tender.  The remainder of the  examination was unremarkable.   HOSPITAL COURSE:  The patient was admitted by Everardo All. Madilyn Fireman, M.D. and seen  by me on May 22.  She agreed to laparoscopic cholecystectomy and that  operation took place on Feb 13, 2003.  She recovered nicely and was sent  home on May 23.  John C. Madilyn Fireman, M.D. had performed ERCP on the date of  admission, May 21.  She recovered well from the laparoscopic  cholecystectomy.  Her laboratories were improving on the day after surgery.  She was sent home on Cipro 500 mg b.i.d. for five days and was to see me in  the office in two to three weeks.   DIAGNOSES:  Cholelithiasis and choledocholithiasis.   OPERATION:  Laparoscopic cholecystectomy.   CONDITION ON DISCHARGE:  Improved.                                               Lorre Munroe., M.D.    WB/MEDQ  D:  04/12/2003  T:  04/12/2003  Job:  [id number]

## 2011-08-13 ENCOUNTER — Ambulatory Visit (INDEPENDENT_AMBULATORY_CARE_PROVIDER_SITE_OTHER): Payer: Medicare Other | Admitting: Internal Medicine

## 2011-08-13 ENCOUNTER — Telehealth: Payer: Self-pay | Admitting: Internal Medicine

## 2011-08-13 ENCOUNTER — Encounter: Payer: Self-pay | Admitting: Internal Medicine

## 2011-08-13 DIAGNOSIS — Z79899 Other long term (current) drug therapy: Secondary | ICD-10-CM

## 2011-08-13 DIAGNOSIS — H539 Unspecified visual disturbance: Secondary | ICD-10-CM

## 2011-08-13 DIAGNOSIS — E785 Hyperlipidemia, unspecified: Secondary | ICD-10-CM

## 2011-08-13 DIAGNOSIS — I10 Essential (primary) hypertension: Secondary | ICD-10-CM

## 2011-08-13 DIAGNOSIS — R21 Rash and other nonspecific skin eruption: Secondary | ICD-10-CM

## 2011-08-13 MED ORDER — LISINOPRIL 10 MG PO TABS: 10.0000 mg | ORAL_TABLET | Freq: Every day | ORAL | Status: DC

## 2011-08-13 NOTE — Progress Notes (Signed)
  Subjective:    Patient ID: Cassandra Miles, female    DOB: 02/06/1941, 70 y.o.   MRN: [id number]  HPI Pt presents to clinic for evaluation of elevated bp. H/o HTN previously maintained on lisinopril in the past but no bp medication currently. BP checks recently elevated including yesterday 158/94. No ha, dizziness or cp. Notes chronic change in vision despite change in glasses prescription. Also has 6wk h/o rash with intermittent papules diffusely. No obvious trigger. No alleviating or exacerbating factors. No improvement. No other complaints.  No past medical history on file. No past surgical history on file.  reports that she has never smoked. She has never used smokeless tobacco. She reports that she drinks alcohol. She reports that she does not use illicit drugs. family history is not on file. Allergies  Allergen Reactions  . Bupropion Hcl   . Ciprofloxacin     REACTION: hives  . Penicillins     REACTION: rash         Review of Systems see hpi     Objective:   Physical Exam  Physical Exam  Nursing note and vitals reviewed. Constitutional: Appears well-developed and well-nourished. No distress.  HENT:  Head: Normocephalic and atraumatic.  Right Ear: External ear normal.  Left Ear: External ear normal.  Eyes: Conjunctivae are normal. No scleral icterus.  Neck: Neck supple. Carotid bruit is not present.  Cardiovascular: Normal rate, regular rhythm and normal heart sounds.  Exam reveals no gallop and no friction rub.   No murmur heard. Pulmonary/Chest: Effort normal and breath sounds normal. No respiratory distress. He has no wheezes. no rales.  Lymphadenopathy:    He has no cervical adenopathy.  Neurological:Alert.  Skin: Skin is warm and dry. Not diaphoretic.  Psychiatric: Has a normal mood and affect.        Assessment & Plan:

## 2011-08-13 NOTE — Patient Instructions (Signed)
Please schedule lipid/lft 272.4, chem7 v58.69 and vitamin d (vitamin d deficiency) prior to next visit

## 2011-08-14 NOTE — Telephone Encounter (Signed)
Lab orders entered for [facility] for December 2012.

## 2011-08-15 DIAGNOSIS — H539 Unspecified visual disturbance: Secondary | ICD-10-CM | POA: Insufficient documentation

## 2011-08-15 DIAGNOSIS — I10 Essential (primary) hypertension: Secondary | ICD-10-CM | POA: Insufficient documentation

## 2011-08-15 DIAGNOSIS — R21 Rash and other nonspecific skin eruption: Secondary | ICD-10-CM | POA: Insufficient documentation

## 2011-08-15 NOTE — Assessment & Plan Note (Signed)
Begin lisinopril 10mg  po qd. Monitor bp as outpt and f/u in clinic as scheduled. Obtain chem7 prior to next visit

## 2011-08-15 NOTE — Assessment & Plan Note (Signed)
optho consult

## 2011-08-15 NOTE — Assessment & Plan Note (Signed)
Dermatology consult

## 2011-09-07 LAB — LIPID PANEL
Cholesterol: 258 mg/dL — ABNORMAL HIGH (ref 0–200)
HDL: 60 mg/dL (ref >39)
LDL Cholesterol: 179 mg/dL — ABNORMAL HIGH (ref 0–99)
Total CHOL/HDL Ratio: 4.3 Ratio
Triglycerides: 94 mg/dL (ref <150)
VLDL: 19 mg/dL (ref 0–40)

## 2011-09-07 NOTE — Telephone Encounter (Signed)
Addended by: Mervin Kung A on: 09/07/2011 08:18 AM   Modules accepted: Orders

## 2011-09-07 NOTE — Telephone Encounter (Signed)
Future lab order released and forwarded to the lab.

## 2011-09-08 LAB — BASIC METABOLIC PANEL
BUN: 16 mg/dL (ref 6–23)
CO2: 27 mEq/L (ref 19–32)
Calcium: 8.8 mg/dL (ref 8.4–10.5)
Chloride: 107 mEq/L (ref 96–112)
Creat: 0.97 mg/dL (ref 0.50–1.10)
Glucose, Bld: 91 mg/dL (ref 70–99)
Potassium: 4.2 mEq/L (ref 3.5–5.3)
Sodium: 142 mEq/L (ref 135–145)

## 2011-09-08 LAB — HEPATIC FUNCTION PANEL
ALT: 10 U/L (ref 0–35)
AST: 17 U/L (ref 0–37)
Albumin: 3.9 g/dL (ref 3.5–5.2)
Alkaline Phosphatase: 91 U/L (ref 39–117)
Bilirubin, Direct: 0.1 mg/dL (ref 0.0–0.3)
Indirect Bilirubin: 0.4 mg/dL (ref 0.0–0.9)
Total Bilirubin: 0.5 mg/dL (ref 0.3–1.2)
Total Protein: 6 g/dL (ref 6.0–8.3)

## 2011-09-08 LAB — VITAMIN D 25 HYDROXY (VIT D DEFICIENCY, FRACTURES): Vit D, 25-Hydroxy: 20 ng/mL — ABNORMAL LOW (ref 30–89)

## 2011-09-11 ENCOUNTER — Encounter: Payer: Self-pay | Admitting: Internal Medicine

## 2011-09-11 ENCOUNTER — Ambulatory Visit (INDEPENDENT_AMBULATORY_CARE_PROVIDER_SITE_OTHER): Payer: Medicare Other | Admitting: Internal Medicine

## 2011-09-11 DIAGNOSIS — E559 Vitamin D deficiency, unspecified: Secondary | ICD-10-CM

## 2011-09-11 DIAGNOSIS — I10 Essential (primary) hypertension: Secondary | ICD-10-CM

## 2011-09-11 DIAGNOSIS — E7800 Pure hypercholesterolemia, unspecified: Secondary | ICD-10-CM

## 2011-09-11 MED ORDER — PRAVASTATIN SODIUM 20 MG PO TABS: 20.0000 mg | ORAL_TABLET | Freq: Every day | ORAL | Status: DC

## 2011-09-11 MED ORDER — LISINOPRIL 20 MG PO TABS: 20.0000 mg | ORAL_TABLET | Freq: Every day | ORAL | Status: DC

## 2011-09-11 NOTE — Assessment & Plan Note (Signed)
Begin otc vitamin d 2000 units qd. Recheck vitamin d prior to next visit

## 2011-09-11 NOTE — Patient Instructions (Signed)
Please schedule lipid/lft 272.4 and vitamin d (vit d deficiency) prior to next visit

## 2011-09-11 NOTE — Assessment & Plan Note (Signed)
Normotensive control. Continue current dose of lisinopril. Monitor bp as outpt.

## 2011-09-11 NOTE — Progress Notes (Signed)
  Subjective:    Patient ID: Cassandra Miles, female    DOB: 15-Nov-1940, 70 y.o.   MRN: [id number]  HPI Pt presents to clinic for followup of multiple medical problems. Tolerating ace inhibitor without cough or lip swelling. Did increase dose to 20mg  due to suboptimal control. bp now normotensive. Reviewed persistently elevated tchol and ldl. Not currently taking medication. Notes fam hx of hyperlipidemia requiring medication. Reviewed depressed vit d level-currently taking vit d in mvi only. No other complaints.  No past medical history on file. No past surgical history on file.  reports that she has never smoked. She has never used smokeless tobacco. She reports that she drinks alcohol. She reports that she does not use illicit drugs. family history is not on file. Allergies  Allergen Reactions  . Bupropion Hcl   . Ciprofloxacin     REACTION: hives  . Penicillins     REACTION: rash   ]   Review of Systems see hpi     Objective:   Physical Exam  Physical Exam  Nursing note and vitals reviewed. Constitutional: Appears well-developed and well-nourished. No distress.  HENT:  Head: Normocephalic and atraumatic.  Right Ear: External ear normal.  Left Ear: External ear normal.  Eyes: Conjunctivae are normal. No scleral icterus.  Neck: Neck supple. Carotid bruit is not present.  Cardiovascular: Normal rate, regular rhythm and normal heart sounds.  Exam reveals no gallop and no friction rub.   No murmur heard. Pulmonary/Chest: Effort normal and breath sounds normal. No respiratory distress. He has no wheezes. no rales.  Lymphadenopathy:    He has no cervical adenopathy.  Neurological:Alert.  Skin: Skin is warm and dry. Not diaphoretic.  Psychiatric: Has a normal mood and affect.        Assessment & Plan:

## 2011-09-11 NOTE — Assessment & Plan Note (Signed)
Attempt low dose statin tx. Recheck lipid/lft prior to next visit. Discussed potential for myalgias and/or abn lft.

## 2011-09-14 ENCOUNTER — Encounter (HOSPITAL_COMMUNITY): Payer: Self-pay | Admitting: Anesthesiology

## 2011-09-14 ENCOUNTER — Emergency Department (HOSPITAL_COMMUNITY): Payer: Medicare Other

## 2011-09-14 ENCOUNTER — Encounter (HOSPITAL_COMMUNITY)
Admission: EM | Disposition: A | Discharge status: Home / Self Care | Payer: Self-pay | Source: Home / Self Care | Attending: Orthopedic Surgery

## 2011-09-14 ENCOUNTER — Encounter (HOSPITAL_COMMUNITY): Payer: Self-pay | Admitting: Emergency Medicine

## 2011-09-14 ENCOUNTER — Emergency Department (HOSPITAL_COMMUNITY): Payer: Medicare Other | Admitting: Anesthesiology

## 2011-09-14 ENCOUNTER — Other Ambulatory Visit: Payer: Self-pay

## 2011-09-14 ENCOUNTER — Inpatient Hospital Stay (HOSPITAL_COMMUNITY)
Admission: EM | Admit: 2011-09-14 | Discharge: 2011-09-15 | DRG: 512 | Disposition: A | Discharge status: Home / Self Care | Payer: Medicare Other | Attending: Orthopedic Surgery | Admitting: Orthopedic Surgery

## 2011-09-14 DIAGNOSIS — K219 Gastro-esophageal reflux disease without esophagitis: Secondary | ICD-10-CM | POA: Diagnosis present

## 2011-09-14 DIAGNOSIS — S52309B Unspecified fracture of shaft of unspecified radius, initial encounter for open fracture type I or II: Principal | ICD-10-CM | POA: Diagnosis present

## 2011-09-14 DIAGNOSIS — Y92009 Unspecified place in unspecified non-institutional (private) residence as the place of occurrence of the external cause: Secondary | ICD-10-CM

## 2011-09-14 DIAGNOSIS — W010XXA Fall on same level from slipping, tripping and stumbling without subsequent striking against object, initial encounter: Secondary | ICD-10-CM | POA: Diagnosis present

## 2011-09-14 DIAGNOSIS — I10 Essential (primary) hypertension: Secondary | ICD-10-CM | POA: Diagnosis present

## 2011-09-14 DIAGNOSIS — S5290XA Unspecified fracture of unspecified forearm, initial encounter for closed fracture: Secondary | ICD-10-CM

## 2011-09-14 DIAGNOSIS — S5290XB Unspecified fracture of unspecified forearm, initial encounter for open fracture type I or II: Secondary | ICD-10-CM

## 2011-09-14 DIAGNOSIS — I4891 Unspecified atrial fibrillation: Secondary | ICD-10-CM | POA: Diagnosis present

## 2011-09-14 HISTORY — DX: Restless legs syndrome: G25.81

## 2011-09-14 HISTORY — PX: ORIF WRIST FRACTURE: SHX2133

## 2011-09-14 HISTORY — DX: Essential (primary) hypertension: I10

## 2011-09-14 LAB — DIFFERENTIAL
Eosinophils Absolute: 0.1 10*3/uL (ref 0.0–0.7)
Lymphocytes Relative: 7 % — ABNORMAL LOW (ref 12–46)
Lymphs Abs: 0.9 10*3/uL (ref 0.7–4.0)
Monocytes Relative: 4 % (ref 3–12)
Neutro Abs: 11 10*3/uL — ABNORMAL HIGH (ref 1.7–7.7)
Neutrophils Relative %: 87 % — ABNORMAL HIGH (ref 43–77)

## 2011-09-14 LAB — BASIC METABOLIC PANEL
BUN: 18 mg/dL (ref 6–23)
Chloride: 103 mEq/L (ref 96–112)
GFR calc non Af Amer: 57 mL/min — ABNORMAL LOW (ref >90)
Glucose, Bld: 109 mg/dL — ABNORMAL HIGH (ref 70–99)
Potassium: 3.5 mEq/L (ref 3.5–5.1)
Sodium: 138 mEq/L (ref 135–145)

## 2011-09-14 LAB — CBC
Hemoglobin: 13.2 g/dL (ref 12.0–15.0)
Platelets: 290 10*3/uL (ref 150–400)
RBC: 4.56 MIL/uL (ref 3.87–5.11)
WBC: 12.5 10*3/uL — ABNORMAL HIGH (ref 4.0–10.5)

## 2011-09-14 LAB — SURGICAL PCR SCREEN
MRSA, PCR: NEGATIVE
Staphylococcus aureus: NEGATIVE

## 2011-09-14 LAB — PROTIME-INR: INR: 0.98 (ref 0.00–1.49)

## 2011-09-14 SURGERY — OPEN REDUCTION INTERNAL FIXATION (ORIF) WRIST FRACTURE
Anesthesia: General | Site: Arm Upper | Wound class: Clean

## 2011-09-14 MED ORDER — SUCCINYLCHOLINE CHLORIDE 20 MG/ML IJ SOLN
INTRAMUSCULAR | Status: DC | PRN
  Administered 2011-09-14: 100 mg via INTRAVENOUS

## 2011-09-14 MED ORDER — PROMETHAZINE HCL 25 MG RE SUPP
12.5000 mg | Freq: Four times a day (QID) | RECTAL | Status: DC | PRN
  Filled 2011-09-14: qty 1

## 2011-09-14 MED ORDER — ACETAMINOPHEN 10 MG/ML IV SOLN
INTRAVENOUS | Status: AC
  Filled 2011-09-14: qty 100

## 2011-09-14 MED ORDER — HYDROMORPHONE HCL PF 1 MG/ML IJ SOLN
1.0000 mg | Freq: Once | INTRAMUSCULAR | Status: AC
  Administered 2011-09-14: 1 mg via INTRAVENOUS
  Filled 2011-09-14: qty 1

## 2011-09-14 MED ORDER — METHOCARBAMOL 500 MG PO TABS: 500.0000 mg | ORAL_TABLET | Freq: Four times a day (QID) | ORAL | Status: DC | PRN

## 2011-09-14 MED ORDER — DEXTROSE 5 % IV SOLN
500.0000 mg | Freq: Four times a day (QID) | INTRAVENOUS | Status: DC | PRN
  Filled 2011-09-14: qty 5

## 2011-09-14 MED ORDER — GENTAMICIN IN SALINE 1.6-0.9 MG/ML-% IV SOLN
INTRAVENOUS | Status: DC | PRN
  Administered 2011-09-14 (×2): 80 mg via INTRAVENOUS

## 2011-09-14 MED ORDER — ACETAMINOPHEN 10 MG/ML IV SOLN
INTRAVENOUS | Status: DC | PRN
  Administered 2011-09-14: 1000 mg via INTRAVENOUS

## 2011-09-14 MED ORDER — MEPERIDINE HCL 50 MG/ML IJ SOLN: 6.2500 mg | INTRAMUSCULAR | Status: DC | PRN

## 2011-09-14 MED ORDER — LIDOCAINE HCL (CARDIAC) 20 MG/ML IV SOLN
INTRAVENOUS | Status: DC | PRN
  Administered 2011-09-14: 70 mg via INTRAVENOUS

## 2011-09-14 MED ORDER — GLYCOPYRROLATE 0.2 MG/ML IJ SOLN
INTRAMUSCULAR | Status: DC | PRN
  Administered 2011-09-14: 0.2 mg via INTRAVENOUS

## 2011-09-14 MED ORDER — VITAMIN C 500 MG PO TABS
1000.0000 mg | ORAL_TABLET | Freq: Every day | ORAL | Status: DC
  Administered 2011-09-14 – 2011-09-15 (×2): 1000 mg via ORAL
  Filled 2011-09-14 (×4): qty 2

## 2011-09-14 MED ORDER — ONDANSETRON HCL 4 MG PO TABS
4.0000 mg | ORAL_TABLET | Freq: Four times a day (QID) | ORAL | Status: DC | PRN
  Filled 2011-09-14: qty 1

## 2011-09-14 MED ORDER — CEFAZOLIN SODIUM 1-5 GM-% IV SOLN
1.0000 g | Freq: Three times a day (TID) | INTRAVENOUS | Status: DC
  Administered 2011-09-14 – 2011-09-15 (×3): 1 g via INTRAVENOUS
  Filled 2011-09-14 (×6): qty 50

## 2011-09-14 MED ORDER — DEXAMETHASONE SODIUM PHOSPHATE 10 MG/ML IJ SOLN
INTRAMUSCULAR | Status: DC | PRN
  Administered 2011-09-14: 10 mg via INTRAVENOUS

## 2011-09-14 MED ORDER — ONDANSETRON HCL 4 MG/2ML IJ SOLN: 4.0000 mg | Freq: Four times a day (QID) | INTRAMUSCULAR | Status: DC | PRN

## 2011-09-14 MED ORDER — LACTATED RINGERS IV SOLN: INTRAVENOUS | Status: DC

## 2011-09-14 MED ORDER — ALPRAZOLAM 0.5 MG PO TABS: 0.5000 mg | ORAL_TABLET | Freq: Four times a day (QID) | ORAL | Status: DC | PRN

## 2011-09-14 MED ORDER — MIDAZOLAM HCL 5 MG/5ML IJ SOLN
INTRAMUSCULAR | Status: DC | PRN
  Administered 2011-09-14: 2 mg via INTRAVENOUS

## 2011-09-14 MED ORDER — ONDANSETRON HCL 4 MG/2ML IJ SOLN
INTRAMUSCULAR | Status: DC | PRN
  Administered 2011-09-14: 4 mg via INTRAVENOUS

## 2011-09-14 MED ORDER — ONDANSETRON HCL 4 MG/2ML IJ SOLN
INTRAMUSCULAR | Status: AC
  Filled 2011-09-14: qty 2

## 2011-09-14 MED ORDER — HYDROMORPHONE HCL PF 1 MG/ML IJ SOLN: 0.2500 mg | INTRAMUSCULAR | Status: DC | PRN

## 2011-09-14 MED ORDER — MORPHINE SULFATE 4 MG/ML IJ SOLN: 1.0000 mg | INTRAMUSCULAR | Status: DC | PRN

## 2011-09-14 MED ORDER — ONDANSETRON HCL 4 MG/2ML IJ SOLN
4.0000 mg | Freq: Once | INTRAMUSCULAR | Status: AC
  Administered 2011-09-14: 4 mg via INTRAVENOUS

## 2011-09-14 MED ORDER — CEFAZOLIN SODIUM 1-5 GM-% IV SOLN
1.0000 g | Freq: Once | INTRAVENOUS | Status: AC
  Administered 2011-09-14: 1 g via INTRAVENOUS
  Filled 2011-09-14: qty 50

## 2011-09-14 MED ORDER — PROPOFOL 10 MG/ML IV EMUL
INTRAVENOUS | Status: DC | PRN
  Administered 2011-09-14: 200 mg via INTRAVENOUS

## 2011-09-14 MED ORDER — ADULT MULTIVITAMIN W/MINERALS CH
1.0000 | ORAL_TABLET | Freq: Every day | ORAL | Status: DC
  Administered 2011-09-14 – 2011-09-15 (×2): 1 via ORAL
  Filled 2011-09-14 (×4): qty 1

## 2011-09-14 MED ORDER — GENTAMICIN IN SALINE 1.6-0.9 MG/ML-% IV SOLN
INTRAVENOUS | Status: AC
  Filled 2011-09-14: qty 50

## 2011-09-14 MED ORDER — PROMETHAZINE HCL 25 MG/ML IJ SOLN: 6.2500 mg | INTRAMUSCULAR | Status: DC | PRN

## 2011-09-14 MED ORDER — FENTANYL CITRATE 0.05 MG/ML IJ SOLN
INTRAMUSCULAR | Status: DC | PRN
  Administered 2011-09-14 (×4): 50 ug via INTRAVENOUS

## 2011-09-14 MED ORDER — OXYCODONE HCL 5 MG PO TABS
5.0000 mg | ORAL_TABLET | ORAL | Status: DC | PRN
  Administered 2011-09-15: 10 mg via ORAL
  Filled 2011-09-14: qty 2

## 2011-09-14 MED ORDER — LACTATED RINGERS IV SOLN
INTRAVENOUS | Status: DC | PRN
  Administered 2011-09-14 (×2): via INTRAVENOUS

## 2011-09-14 MED ORDER — 0.9 % SODIUM CHLORIDE (POUR BTL) OPTIME
TOPICAL | Status: DC | PRN
  Administered 2011-09-14: 1000 mL

## 2011-09-14 MED ORDER — PROMETHAZINE HCL 25 MG/ML IJ SOLN: 12.5000 mg | Freq: Four times a day (QID) | INTRAMUSCULAR | Status: DC | PRN

## 2011-09-14 MED ORDER — GENTAMICIN SULFATE 40 MG/ML IJ SOLN
130.0000 mg | INTRAMUSCULAR | Status: AC
  Filled 2011-09-14: qty 3.25

## 2011-09-14 MED ORDER — EPHEDRINE SULFATE 50 MG/ML IJ SOLN
INTRAMUSCULAR | Status: DC | PRN
  Administered 2011-09-14 (×2): 10 mg via INTRAVENOUS

## 2011-09-14 MED ORDER — HYDROMORPHONE HCL PF 1 MG/ML IJ SOLN
INTRAMUSCULAR | Status: AC
  Administered 2011-09-14: 1 mg
  Filled 2011-09-14: qty 1

## 2011-09-14 MED ORDER — DOCUSATE SODIUM 100 MG PO CAPS
100.0000 mg | ORAL_CAPSULE | Freq: Two times a day (BID) | ORAL | Status: DC
  Administered 2011-09-14 – 2011-09-15 (×2): 100 mg via ORAL
  Filled 2011-09-14 (×6): qty 1

## 2011-09-14 SURGICAL SUPPLY — 62 items
BAG SPEC THK2 15X12 ZIP CLS (MISCELLANEOUS) ×1
BAG ZIPLOCK 12X15 (MISCELLANEOUS) ×2 IMPLANT
BANDAGE ELASTIC 3 VELCRO ST LF (GAUZE/BANDAGES/DRESSINGS) ×1 IMPLANT
BANDAGE GAUZE ELAST BULKY 4 IN (GAUZE/BANDAGES/DRESSINGS) ×3 IMPLANT
BIT DRILL 2.5X2.75 QC CALB (BIT) ×1 IMPLANT
BIT DRILL CALIBRATED 2.7 (BIT) ×1 IMPLANT
CLOTH BEACON ORANGE TIMEOUT ST (SAFETY) ×2 IMPLANT
CUFF TOURN SGL QUICK 18 (TOURNIQUET CUFF) ×2 IMPLANT
DECANTER SPIKE VIAL GLASS SM (MISCELLANEOUS) ×2 IMPLANT
DRAPE OEC MINIVIEW 54X84 (DRAPES) ×2 IMPLANT
DRAPE U-SHAPE 47X51 STRL (DRAPES) ×2 IMPLANT
DRSG ADAPTIC 3X8 NADH LF (GAUZE/BANDAGES/DRESSINGS) ×1 IMPLANT
DRSG PAD ABDOMINAL 8X10 ST (GAUZE/BANDAGES/DRESSINGS) ×2 IMPLANT
ELECT REM PT RETURN 9FT ADLT (ELECTROSURGICAL) ×2
ELECTRODE REM PT RTRN 9FT ADLT (ELECTROSURGICAL) ×1 IMPLANT
EVACUATOR 1/8 PVC DRAIN (DRAIN) ×2 IMPLANT
GAUZE KERLIX 2  STERILE LF (GAUZE/BANDAGES/DRESSINGS) ×2 IMPLANT
GAUZE SPONGE 4X4 16PLY XRAY LF (GAUZE/BANDAGES/DRESSINGS) ×3 IMPLANT
GAUZE XEROFORM 1X8 LF (GAUZE/BANDAGES/DRESSINGS) ×2 IMPLANT
GLOVE BIO SURGEON STRL SZ8 (GLOVE) ×2 IMPLANT
GOWN STRL REIN XL XLG (GOWN DISPOSABLE) ×2 IMPLANT
KIT BASIN OR (CUSTOM PROCEDURE TRAY) ×2 IMPLANT
KWIRE 4.0 X .045IN (WIRE) ×4 IMPLANT
KWIRE 4.0 X .062IN (WIRE) ×4 IMPLANT
MANIFOLD NEPTUNE II (INSTRUMENTS) ×2 IMPLANT
NS IRRIG 1000ML POUR BTL (IV SOLUTION) ×2 IMPLANT
PACK LOWER EXTREMITY WL (CUSTOM PROCEDURE TRAY) ×2 IMPLANT
PAD CAST 3X4 CTTN HI CHSV (CAST SUPPLIES) ×1 IMPLANT
PAD CAST 4YDX4 CTTN HI CHSV (CAST SUPPLIES) ×1 IMPLANT
PADDING CAST ABS 3INX4YD NS (CAST SUPPLIES) ×1
PADDING CAST ABS 4INX4YD NS (CAST SUPPLIES) ×1
PADDING CAST ABS COTTON 3X4 (CAST SUPPLIES) IMPLANT
PADDING CAST ABS COTTON 4X4 ST (CAST SUPPLIES) IMPLANT
PADDING CAST COTTON 2X4 NS (CAST SUPPLIES) ×2 IMPLANT
PADDING CAST COTTON 3X4 STRL (CAST SUPPLIES) ×4
PADDING CAST COTTON 4X4 STRL (CAST SUPPLIES) ×4
PADDING WEBRIL 4 STERILE (GAUZE/BANDAGES/DRESSINGS) ×2 IMPLANT
PLATE LOCK COMP 7H FOOT (Plate) ×2 IMPLANT
POSITIONER SURGICAL ARM (MISCELLANEOUS) ×2 IMPLANT
SCREW CORTICAL 3.5MM 14MM (Screw) ×2 IMPLANT
SCREW LOCK CORT STAR 3.5X10 (Screw) ×4 IMPLANT
SPLINT FIBERGLASS 4X30 (CAST SUPPLIES) ×1 IMPLANT
SPONGE GAUZE 4X4 12PLY (GAUZE/BANDAGES/DRESSINGS) ×2 IMPLANT
SUT BONE WAX W31G (SUTURE) ×2 IMPLANT
SUT ETHILON 6 0 PS 3 18 (SUTURE) ×2 IMPLANT
SUT MERSILENE 4 0 P 3 (SUTURE) ×2 IMPLANT
SUT MNCRL AB 4-0 PS2 18 (SUTURE) ×2 IMPLANT
SUT PROLENE 3 0 PS 2 (SUTURE) ×3 IMPLANT
SUT PROLENE 4 0 P 3 18 (SUTURE) ×2 IMPLANT
SUT PROLENE 4 0 RB 1 (SUTURE) ×2
SUT PROLENE 4-0 RB1 .5 CRCL 36 (SUTURE) ×1 IMPLANT
SUT VIC AB 0 CT1 27 (SUTURE) ×4
SUT VIC AB 0 CT1 27XBRD ANTBC (SUTURE) ×2 IMPLANT
SUT VIC AB 2-0 CT1 27 (SUTURE) ×2
SUT VIC AB 2-0 CT1 TAPERPNT 27 (SUTURE) ×1 IMPLANT
SUT VIC AB 3-0 SH 27 (SUTURE) ×2
SUT VIC AB 3-0 SH 27X BRD (SUTURE) IMPLANT
SYSTEM CHEST DRAIN TLS 7FR (DRAIN) ×1 IMPLANT
TAPE CAST 4X4 WHT DELT L NS LF (CAST SUPPLIES) ×1 IMPLANT
TOWEL OR 17X26 10 PK STRL BLUE (TOWEL DISPOSABLE) ×2 IMPLANT
UNDERPAD 30X30 INCONTINENT (UNDERPADS AND DIAPERS) ×2 IMPLANT
WATER STERILE IRR 1500ML POUR (IV SOLUTION) ×2 IMPLANT

## 2011-09-14 NOTE — Transfer of Care (Signed)
Immediate Anesthesia Transfer of Care Note  Patient: Cassandra Miles  Procedure(s) Performed:  OPEN REDUCTION INTERNAL FIXATION (ORIF) WRIST FRACTURE  Patient Location: PACU  Anesthesia Type: General  Level of Consciousness: sedated, patient cooperative and responds to stimulaton  Airway & Oxygen Therapy: Patient Spontanous Breathing and Patient connected to face mask oxgen  Post-op Assessment: Report given to PACU RN and Post -op Vital signs reviewed and stable  Post vital signs: Reviewed and stable  Complications: No apparent anesthesia complications

## 2011-09-14 NOTE — ED Notes (Signed)
Pt states that she has previously had a dislocation to the rt clavicle,

## 2011-09-14 NOTE — Op Note (Signed)
Cassandra Miles, Cassandra [name]:  [id number]  MEDICAL RECORD NO.:  [id number]  LOCATION:  1607                         FACILITY:  [facility]  PHYSICIAN:  Dionne Ano. Seve Monette, M.D.DATE OF BIRTH:  1941/01/05  DATE OF PROCEDURE: DATE OF DISCHARGE:                              OPERATIVE REPORT   SURGEON:  Dionne Ano. Herley Bernardini, MD  PREOPERATIVE DIAGNOSIS:  Type 1 open forearm shaft fracture (radial shaft fracture), right forearm.  POSTOPERATIVE DIAGNOSIS:  Type 1 open forearm shaft fracture (radial shaft fracture), right forearm.  PROCEDURE: 1. I and D, open radius shaft fracture, right forearm. 2. Open reduction and internal fixation, radial shaft fracture with 7-     hole plate from [employer] (locking plate). 3. Stress radiography. 4. Median nerve neurolysis and exploration.  ASSISTANT:  Karie Chimera, P.A.-C.  COMPLICATIONS:  None.  ANESTHESIA:  General.  TOURNIQUET TIME:  Less than an hour.  DRAINS:  One.  INDICATIONS FOR THE PROCEDURE:  This is a pleasant 70 year old female who presents with the above mentioned diagnosis.  I have counseled her in regard to risks and benefits of surgery including the risk of infection, bleeding, anesthesia, damage to normal structures, and failure of surgery to accomplish its intended goals of relieving symptoms and restoring function.  With this mind, she desires to proceed.  All questions have been encouraged and answered preoperatively.  DESCRIPTION OF PROCEDURE:  The patient was seen by myself and Anesthesia, taken to the operative suite, underwent smooth induction of general anesthesia.  Time-out was called.  Arm was prepped and draped in the usual sterile fashion and Betadine was scrub and paint.  Once this was done, a sterile field was secured.  Body parts were well padded. Time-out was being called and I performed the I and D of skin and subcutaneous tissue.  Following this, the skin incision was made. Dissection was  carried down and a volar [name] approach was created.  I swept the radial artery and FCR radially and the brachioradialis and superficial radial nerve radially.  I exposed the fracture site, exposed the shaft, curettage of the shaft performed.  Copious irrigation of greater than 3 L of fluid.  Following this, I changed the drapes. Following this, the patient then underwent identification of the fracture followed by open reduction and internal fixation with a Biomet 7 hole plate.  I placed the plate in compression mode without difficulty.  Two screws were placed in the plate in a compression mode fashion.  This compressed the fracture site was interdigitated perfectly.  Following this, additional locking screws were placed and all looked quite well.  This was checked under fluoroscopy.  Final copy of x-rays was taken as permanent documentation.  Once this was done, I then performed irrigation once again followed by exploration of the median nerve given the proximity of the nerve to the open shaft area.  I performed a debridement of the palmaris longus. During the initial I and D, I noted that the median nerve was intact and then underwent a limited neurolysis at the side of open injury. Following this, tourniquet was deflated.  Hemostasis was secured and wound was closed with Vicryl followed by  Prolene.  Long-arm splint was applied.  Drain was hooked up to suction.  She was taken to recovery room after extubation.  She will be monitored, admitted for IV antibiotics, general postoperative observation.  I gave her 1 dose of gentamicin and 1 dose if Ancef preop.  We will continue Ancef postop. These notes have been discussed and all questions have been encouraged and answered.     Dionne Ano. Amanda Pea, M.D.     [facility]  D:  09/14/2011  T:  09/14/2011  Job:  [id number]

## 2011-09-14 NOTE — Anesthesia Postprocedure Evaluation (Signed)
  Anesthesia Post-op Note  Patient: Cassandra Miles  Procedure(s) Performed:  OPEN REDUCTION INTERNAL FIXATION (ORIF) WRIST FRACTURE  Patient Location: PACU  Anesthesia Type: General  Level of Consciousness: awake and alert   Airway and Oxygen Therapy: Patient Spontanous Breathing  Post-op Pain: mild  Post-op Assessment: Post-op Vital signs reviewed, Patient's Cardiovascular Status Stable, Respiratory Function Stable, Patent Airway and No signs of Nausea or vomiting  Post-op Vital Signs: stable  Complications: No apparent anesthesia complications

## 2011-09-14 NOTE — ED Notes (Signed)
YNW:GN56<OZ> Expected date:09/14/11<BR> Expected time:11:32 AM<BR> Means of arrival:Ambulance<BR> Comments:<BR> GC M100. 70 yo f. Fall, tripped on bricks, fall, poss fx to forearm. Fentanyl for pain mgt. 25 eta.

## 2011-09-14 NOTE — ED Notes (Signed)
Family at bedside. Assisted pt to gown.

## 2011-09-14 NOTE — ED Notes (Signed)
200 mcg fentayln at 4 doses, pt alert x4 talking, bp 155/100, iv placed by ems, states that pain is better 3/10

## 2011-09-14 NOTE — Anesthesia Preprocedure Evaluation (Addendum)
Anesthesia Evaluation  Patient identified by MRN, date of birth, ID band Patient awake    Reviewed: Allergy & Precautions, H&P , NPO status , Patient's Chart, lab work & pertinent test results  Airway Mallampati: III TM Distance: >3 FB Neck ROM: Full  Mouth opening: Limited Mouth Opening  Dental No notable dental hx.    Pulmonary neg pulmonary ROS, sleep apnea and Continuous Positive Airway Pressure Ventilation ,  clear to auscultation  Pulmonary exam normal       Cardiovascular hypertension, Pt. on medications neg cardio ROS + dysrhythmias Atrial Fibrillation Regular Normal    Neuro/Psych Negative Neurological ROS  Negative Psych ROS   GI/Hepatic negative GI ROS, Neg liver ROS, GERD-  ,  Endo/Other  Negative Endocrine ROS  Renal/GU negative Renal ROS  Genitourinary negative   Musculoskeletal negative musculoskeletal ROS (+)   Abdominal   Peds negative pediatric ROS (+)  Hematology negative hematology ROS (+)   Anesthesia Other Findings   Reproductive/Obstetrics negative OB ROS                          Anesthesia Physical Anesthesia Plan  ASA: II  Anesthesia Plan: General   Post-op Pain Management:    Induction: Intravenous  Airway Management Planned: LMA  Additional Equipment:   Intra-op Plan:   Post-operative Plan: Extubation in OR  Informed Consent: I have reviewed the patients History and Physical, chart, labs and discussed the procedure including the risks, benefits and alternatives for the proposed anesthesia with the patient or authorized representative who has indicated his/her understanding and acceptance.   Dental advisory given  Plan Discussed with: CRNA  Anesthesia Plan Comments:         Anesthesia Quick Evaluation

## 2011-09-14 NOTE — H&P (Signed)
Cassandra Miles is an 70 y.o. female.   Chief Complaint:open right forearm fracture HPI:70 yo WF SP fall with open right radius fracture. Patient denies fever chills neck back chest or abdominal pain.  She is alert and oriented. She denies other complaints. I have reviewed her history and findings in detail.  Past Medical History  Diagnosis Date  . Restless leg syndrome   . Hypertension     Past Surgical History  Procedure Date  . Hip fracture surgery     No family history on file. Social History:  reports that she has never smoked. She has never used smokeless tobacco. She reports that she drinks alcohol. She reports that she does not use illicit drugs.  Allergies:  Allergies  Allergen Reactions  . Bupropion Hcl Other (See Comments)    Made her feel bad  . Ciprofloxacin Nausea And Vomiting    REACTION: hives  . Penicillins Rash    REACTION: rash    Medications Prior to Admission  Medication Dose Route Frequency Provider Last Rate Last Dose  . ceFAZolin (ANCEF) IVPB 1 g/50 mL premix  1 g Intravenous Once Hilario Quarry, MD   1 g at 09/14/11 1522  . gentamicin (GARAMYCIN) 130 mg in dextrose 5 % 50 mL IVPB  130 mg Intravenous To ER Hilario Quarry, MD      . HYDROmorphone (DILAUDID) 1 MG/ML injection        1 mg at 09/14/11 1221  . HYDROmorphone (DILAUDID) injection 1 mg  1 mg Intravenous Once Hilario Quarry, MD   1 mg at 09/14/11 1521  . ondansetron (ZOFRAN) injection 4 mg  4 mg Intravenous Once Hilario Quarry, MD   4 mg at 09/14/11 1222   Medications Prior to Admission  Medication Sig Dispense Refill  . lisinopril (PRINIVIL,ZESTRIL) 20 MG tablet Take 1 tablet (20 mg total) by mouth daily.  30 tablet  6  . omeprazole (PRILOSEC OTC) 20 MG tablet Take 20 mg by mouth daily as needed.        Marland Kitchen rOPINIRole (REQUIP) 1 MG tablet Take 1 mg by mouth at bedtime.        . sertraline (ZOLOFT) 100 MG tablet Take 100 mg by mouth daily. Take one and one half tablet once a day.       .  pravastatin (PRAVACHOL) 20 MG tablet Take 1 tablet (20 mg total) by mouth daily.  30 tablet  6    Results for orders placed during the hospital encounter of 09/14/11 (from the past 48 hour(s))  CBC     Status: Abnormal   Collection Time   09/14/11  1:05 PM      Component Value Range Comment   WBC 12.5 (*) 4.0 - 10.5 (K/uL)    RBC 4.56  3.87 - 5.11 (MIL/uL)    Hemoglobin 13.2  12.0 - 15.0 (g/dL)    HCT 16.1  09.6 - 04.5 (%)    MCV 88.8  78.0 - 100.0 (fL)    MCH 28.9  26.0 - 34.0 (pg)    MCHC 32.6  30.0 - 36.0 (g/dL)    RDW 40.9  81.1 - 91.4 (%)    Platelets 290  150 - 400 (K/uL)   DIFFERENTIAL     Status: Abnormal   Collection Time   09/14/11  1:05 PM      Component Value Range Comment   Neutrophils Relative 87 (*) 43 - 77 (%)    Neutro Abs 11.0 (*)  1.7 - 7.7 (K/uL)    Lymphocytes Relative 7 (*) 12 - 46 (%)    Lymphs Abs 0.9  0.7 - 4.0 (K/uL)    Monocytes Relative 4  3 - 12 (%)    Monocytes Absolute 0.5  0.1 - 1.0 (K/uL)    Eosinophils Relative 1  0 - 5 (%)    Eosinophils Absolute 0.1  0.0 - 0.7 (K/uL)    Basophils Relative 0  0 - 1 (%)    Basophils Absolute 0.0  0.0 - 0.1 (K/uL)   BASIC METABOLIC PANEL     Status: Abnormal   Collection Time   09/14/11  1:05 PM      Component Value Range Comment   Sodium 138  135 - 145 (mEq/L)    Potassium 3.5  3.5 - 5.1 (mEq/L)    Chloride 103  96 - 112 (mEq/L)    CO2 25  19 - 32 (mEq/L)    Glucose, Bld 109 (*) 70 - 99 (mg/dL)    BUN 18  6 - 23 (mg/dL)    Creatinine, Ser 0.98  0.50 - 1.10 (mg/dL)    Calcium 9.2  8.4 - 10.5 (mg/dL)    GFR calc non Af Amer 57 (*) >90 (mL/min)    GFR calc Af Amer 66 (*) >90 (mL/min)   PROTIME-INR     Status: Normal   Collection Time   09/14/11  1:05 PM      Component Value Range Comment   Prothrombin Time 13.2  11.6 - 15.2 (seconds)    INR 0.98  0.00 - 1.49     Dg Shoulder Right  09/14/2011  *RADIOLOGY REPORT*  Clinical Data: Fall.  Pain.  RIGHT SHOULDER - 2+ VIEW  Comparison: None.  Findings: Are  three-view exam shows no evidence for an acute fracture.  No evidence for shoulder separation or dislocation. Deformity of the right clavicle is stable since 08/23/2005 and likely reflects old trauma.  No worrisome lytic or sclerotic osseous abnormality.  IMPRESSION: No acute bony findings.  Original Report Authenticated By: ERIC A. MANSELL, M.D.   Dg Elbow Complete Right  09/14/2011  *RADIOLOGY REPORT*  Clinical Data: Fall.  Pain.  RIGHT ELBOW - COMPLETE 3+ VIEW  Comparison: None.  Findings: Four views study shows no fracture.  No subluxation or dislocation.  No fat pad elevation to suggest joint effusion. Bones are diffusely demineralized.  IMPRESSION: No acute findings.  Original Report Authenticated By: ERIC A. MANSELL, M.D.   Dg Forearm Right  09/14/2011  *RADIOLOGY REPORT*  Clinical Data: Fall.  Pain.  RIGHT FOREARM - 2 VIEW  Comparison: None.  Findings: Two-view exam shows a comminuted fracture of the distal radial diaphysis with apex anterior angulation and approximately 7 mm bony overriding.  There is some gas in the anterior soft tissues of the distal forearm raise the question for open injury. Irregularity at the ulnar styloid is compatible with age indeterminate trauma.  IMPRESSION: Comminuted fracture of the distal radial diaphysis.  Gas in the anterior soft tissues raises the question of an open injury.  Original Report Authenticated By: ERIC A. MANSELL, M.D.   Dg Wrist Complete Right  09/14/2011  *RADIOLOGY REPORT*  Clinical Data: Fall with diffuse arm pain.  RIGHT WRIST - COMPLETE 3+ VIEW  Comparison: None.  Findings: Bones are diffusely demineralized.  There is a comminuted fracture of the distal radial diaphysis.  Probable associated ulnar styloid fracture.  Degenerative changes are seen in the first carpal metacarpal joint.  Bony alignment of the carpus is intact.  IMPRESSION: Comminuted fracture of the distal radial diaphysis with probable ulnar styloid fracture.  Original Report  Authenticated By: ERIC A. MANSELL, M.D.    ROS  Blood pressure 157/87, pulse 62, temperature 98.1 F (36.7 C), temperature source Oral, resp. rate 20, SpO2 99.00%. Physical Exam ..The patient is alert and oriented in no acute distress the patient complains of pain in the affected upper extremity. The patient is noted to have a normal HEENT exam. Lung fields show equal chest expansion and no shortness of breath abdomen exam is nontender without distention. Lower extremity examination does not show any fracture dislocation or blood clot symptoms. Pelvis is stable neck and back are stable and nontender She has a type 1 open forearm fracture(radius) She has intact sensation and refill to the hand  No signs of compartment syndrome It appears the open wound is less than1 hour old  Assessment/Plan We will plan for I&D and repair reconstruction of the open fracture as necessary( ORIF Rt Radius) .Marland KitchenWe are planning surgery for your upper extremity. The risk and benefits of surgery include risk of bleeding infection anesthesia damage to normal structures and failure of the surgery to accomplish its intended goals of relieving symptoms and restoring function with this in mind we'll going to proceed. I have specifically discussed with the patient the pre-and postoperative regime and the does and don'ts and risk and benefits in great detail. Risk and benefits of surgery also include risk of dystrophy chronic nerve pain failure of the healing process to go onto completion and other inherent risks of surgery The relavent the pathophysiology of the disease/injury process, as well as the alternatives for treatment and postoperative course of action has been discussed in great detail with the patient who desires to proceed.  We will do everything in our power to help you (the patient) restore function to the upper extremity. Is a pleasure to see this patient today.   Karen Chafe 09/14/2011, 3:56  PM

## 2011-09-14 NOTE — ED Provider Notes (Signed)
History     CSN: [id number]  Arrival date & time 09/14/11  1139   First MD Initiated Contact with Patient 09/14/11 1203      Chief Complaint  Patient presents with  . Fall    no loc   . Arm Pain    fell on brick porch and caught self with rt arm has a defomirty to rt upper arm     (Consider location/radiation/quality/duration/timing/severity/associated sxs/prior treatment) HPI  Patient tripped and fell just pta. She caught herself with right hand and has deformity to right wrist.  She is right handed.  She states some soreness in right shoulder but does not think any other injuries.  PMD Charlynn Court    Past Medical History  Diagnosis Date  . Restless leg syndrome   . Hypertension     Past Surgical History  Procedure Date  . Hip fracture surgery     No family history on file.  History  Substance Use Topics  . Smoking status: Never Smoker   . Smokeless tobacco: Never Used  . Alcohol Use: Yes    OB History    Grav Para Term Preterm Abortions TAB SAB Ect Mult Living                  Review of Systems  All other systems reviewed and are negative.    Allergies  Bupropion hcl; Ciprofloxacin; and Penicillins  Home Medications   Current Outpatient Rx  Name Route Sig Dispense Refill  . LISINOPRIL 20 MG PO TABS Oral Take 1 tablet (20 mg total) by mouth daily. 30 tablet 6  . OMEPRAZOLE MAGNESIUM 20 MG PO TBEC Oral Take 20 mg by mouth daily as needed.      Marland Kitchen PRAVASTATIN SODIUM 20 MG PO TABS Oral Take 1 tablet (20 mg total) by mouth daily. 30 tablet 6  . ROPINIROLE HCL 1 MG PO TABS Oral Take 1 mg by mouth at bedtime.      . SERTRALINE HCL 100 MG PO TABS Oral Take 100 mg by mouth daily. Take one and one half tablet once a day.       BP 157/87  Pulse 62  Temp(Src) 98.1 F (36.7 C) (Oral)  Resp 20  SpO2 99%  Physical Exam  Nursing note and vitals reviewed. Constitutional: She appears well-developed and well-nourished.  HENT:  Head: Normocephalic and  atraumatic.  Right Ear: External ear normal.  Left Ear: External ear normal.  Nose: Nose normal.  Mouth/Throat: Oropharynx is clear and moist.  Eyes: Conjunctivae and EOM are normal. Pupils are equal, round, and reactive to light.  Neck: Normal range of motion. Neck supple.  Cardiovascular: Normal rate, regular rhythm and normal heart sounds.   Pulmonary/Chest: Effort normal and breath sounds normal.  Abdominal: Soft. Bowel sounds are normal.  Musculoskeletal:       Right wrist with deformity, neurovascularly intact distal to injury, some tenderness over scapula and right elbow  Skin: Skin is warm and dry.  Psychiatric: She has a normal mood and affect.    ED Course  Procedures (including critical care time)  Labs Reviewed - No data to display No results found.   No diagnosis found.    MDM  Dg Shoulder Right  09/14/2011  *RADIOLOGY REPORT*  Clinical Data: Fall.  Pain.  RIGHT SHOULDER - 2+ VIEW  Comparison: None.  Findings: Are three-view exam shows no evidence for an acute fracture.  No evidence for shoulder separation or dislocation. Deformity of the  right clavicle is stable since 08/23/2005 and likely reflects old trauma.  No worrisome lytic or sclerotic osseous abnormality.  IMPRESSION: No acute bony findings.  Original Report Authenticated By: ERIC A. MANSELL, M.D.   Dg Elbow Complete Right  09/14/2011  *RADIOLOGY REPORT*  Clinical Data: Fall.  Pain.  RIGHT ELBOW - COMPLETE 3+ VIEW  Comparison: None.  Findings: Four views study shows no fracture.  No subluxation or dislocation.  No fat pad elevation to suggest joint effusion. Bones are diffusely demineralized.  IMPRESSION: No acute findings.  Original Report Authenticated By: ERIC A. MANSELL, M.D.   Dg Forearm Right  09/14/2011  *RADIOLOGY REPORT*  Clinical Data: Fall.  Pain.  RIGHT FOREARM - 2 VIEW  Comparison: None.  Findings: Two-view exam shows a comminuted fracture of the distal radial diaphysis with apex anterior  angulation and approximately 7 mm bony overriding.  There is some gas in the anterior soft tissues of the distal forearm raise the question for open injury. Irregularity at the ulnar styloid is compatible with age indeterminate trauma.  IMPRESSION: Comminuted fracture of the distal radial diaphysis.  Gas in the anterior soft tissues raises the question of an open injury.  Original Report Authenticated By: ERIC A. MANSELL, M.D.   Dg Wrist Complete Right  09/14/2011  *RADIOLOGY REPORT*  Clinical Data: Fall with diffuse arm pain.  RIGHT WRIST - COMPLETE 3+ VIEW  Comparison: None.  Findings: Bones are diffusely demineralized.  There is a comminuted fracture of the distal radial diaphysis.  Probable associated ulnar styloid fracture.  Degenerative changes are seen in the first carpal metacarpal joint.  Bony alignment of the carpus is intact.  IMPRESSION: Comminuted fracture of the distal radial diaphysis with probable ulnar styloid fracture.  Original Report Authenticated By: ERIC A. MANSELL, M.D.    Patient reexamined and states bleeding started in x-Tonyia Marschall- patient now with 1 cm laceration above fracture.    Date: 09/14/2011  Rate: 62  Rhythm: normal sinus rhythm  QRS Axis: left  Intervals: PR prolonged  ST/T Wave abnormalities: normal  Conduction Disutrbances:none  Narrative Interpretation:   Old EKG Reviewed: changes noted    Hilario Quarry, MD 09/14/11 [phone]

## 2011-09-14 NOTE — Brief Op Note (Signed)
09/14/2011  6:09 PM  PATIENT:  Cassandra Miles  70 y.o. female  PRE-OPERATIVE DIAGNOSIS:  fractured right forearm  POST-OPERATIVE DIAGNOSIS:  fractured right forearm  PROCEDURE:  Procedure(s): OPEN REDUCTION INTERNAL FIXATION (ORIF) WRIST FRACTURE I&D RT forearm with median nerve neurolysis Stess Xray  SURGEON:  Surgeon(s): Oletta Cohn III PHYSICIAN ASSISTANT: Wynona Neat   ASSISTANTSWynona Neat [facility] ANESTHESIA:   general  EBL:  Total I/O In: 1000 [I.V.:1000] Out: -   BLOOD ADMINISTERED:none  DRAINS: Penrose drain in the forearm   LOCAL MEDICATIONS USED:  NONE  SPECIMEN:  No Specimen  DISPOSITION OF SPECIMEN:  N/A  COUNTS:  YES  TOURNIQUET:   Total Tourniquet Time Documented: Upper Arm (Right) - 32 minutes  DICTATION: .Other Dictation: Dictation Number [id number]  PLAN OF CARE: Admit to inpatient   PATIENT DISPOSITION:  PACU - hemodynamically stable.   Delay start of Pharmacological VTE agent (>24hrs) due to surgical blood loss or risk of bleeding:  {YES/NO/NOT APPLICABLE:20182

## 2011-09-15 MED ORDER — DSS 100 MG PO CAPS: 100.0000 mg | ORAL_CAPSULE | Freq: Two times a day (BID) | ORAL | Status: AC

## 2011-09-15 MED ORDER — CEPHALEXIN 500 MG PO CAPS: 500.0000 mg | ORAL_CAPSULE | Freq: Four times a day (QID) | ORAL | Status: AC

## 2011-09-15 MED ORDER — VITAMINS A & D EX OINT
TOPICAL_OINTMENT | CUTANEOUS | Status: AC
  Administered 2011-09-15: 5
  Filled 2011-09-15: qty 5

## 2011-09-15 MED ORDER — ASCORBIC ACID 1000 MG PO TABS: 1000.0000 mg | ORAL_TABLET | Freq: Every day | ORAL | Status: DC

## 2011-09-15 MED ORDER — OXYCODONE HCL 5 MG PO TABS: 5.0000 mg | ORAL_TABLET | ORAL | Status: AC | PRN

## 2011-09-15 NOTE — Progress Notes (Signed)
Patient stable; discharged home with friend.  Transported via wheelchair to private vehicle by NT.

## 2011-09-15 NOTE — Discharge Summary (Signed)
Physician Discharge Summary  Patient ID: Cassandra Miles MRN: [id number] DOB/AGE: 68-29-1942 68 y.o.  Admit date: 09/14/2011 Discharge date: 09/15/2011  Admission Diagnoses:  Discharge Diagnoses:  Active Problems:  * No active hospital problems. *    Discharged Condition: good  Hospital Course: The patient underwent surgery 09/14/2011 in the form of I and D and ORIF of her right radius. She was admitted for postoperative antibiotics and general observation. She did quite well at the time of discharge she was awake alert and oriented without problems she was tolerating a regular diet HEENTs was within normal limits graft she notes no complications. She has normal sensation and motor function to the upper extremity and looks quite well. She walks with a nonantalgic gait. She understands and would like for her to continue elevation range of motion massage and other measures such thoroughly discussed with her. We'll plan to discharge her home on antibiotics in the form of Keflex and pain medicine in the form of oxycodone. Check problems arise she will notify us otherwise will see her back in the office in 10-14 days with RTC by myself and therapy on the same day. Spelled at 2 pleasure seeing treated she did very well with her hospital course no postoperative complications evidence of UTI DVT shortness of breath or other complicating feature Consults: none  Significant Diagnostic Studies: radiology: X-Ray: Fracture radius right upper extremity  Treatments: surgery: ORIF of her right open radius fracture 09/14/2011  Discharge Exam: Blood pressure 111/57, pulse 72, temperature 97.5 F (36.4 C), temperature source Oral, resp. rate 16, height 5\' 7"  (1.702 m), weight 83.462 kg (184 lb), SpO2 92.00%. Incision/Wound: she is alert and oriented no acute distress about signs stable lower stem examination is benign HEENT skin no limits chest is clear abdomen is nontender heart regular rate  murmur.  Disposition:  stable Discharge Orders    Future Orders Please Complete By Expires   Sling        Medication List  As of 09/15/2011  1:23 PM   START taking these medications         ascorbic acid 1000 MG tablet   Commonly known as: VITAMIN C   Take 1 tablet (1,000 mg total) by mouth daily.      cephALEXin 500 MG capsule   Commonly known as: KEFLEX   Take 1 capsule (500 mg total) by mouth 4 (four) times daily.      DSS 100 MG Caps   Take 100 mg by mouth 2 (two) times daily.      oxyCODONE 5 MG immediate release tablet   Commonly known as: Oxy IR/ROXICODONE   Take 1-2 tablets (5-10 mg total) by mouth every 3 (three) hours as needed.         CONTINUE taking these medications         lisinopril 20 MG tablet   Commonly known as: PRINIVIL,ZESTRIL   Take 1 tablet (20 mg total) by mouth daily.      omeprazole 20 MG tablet   Commonly known as: PRILOSEC OTC      pravastatin 20 MG tablet   Commonly known as: PRAVACHOL   Take 1 tablet (20 mg total) by mouth daily.      rOPINIRole 1 MG tablet   Commonly known as: REQUIP      sertraline 100 MG tablet   Commonly known as: ZOLOFT         STOP taking these medications  naproxen sodium 220 MG tablet          Where to get your medications    These are the prescriptions that you need to pick up.   You may get these medications from any pharmacy.         ascorbic acid 1000 MG tablet   cephALEXin 500 MG capsule   DSS 100 MG Caps   oxyCODONE 5 MG immediate release tablet           Follow-up Information    Follow up with Aron Baba M. (call to see Dr. Amanda Pea and therapy on the same day .  Please let the office know that you are to see Dr. Amanda Pea first and then therapy on the same day at his office)    Contact information:   [address] [place] [address] [phone]          Signed: Karen Chafe 09/15/2011, 1:23 PM

## 2011-09-15 NOTE — Plan of Care (Signed)
Problem: Phase II Progression Outcomes Goal: Surgical site without signs of infection Outcome: Adequate for Discharge Dressing not removed prior to discharge.

## 2011-09-15 NOTE — Plan of Care (Signed)
Problem: Phase II Progression Outcomes Goal: Progress activity as tolerated unless otherwise ordered Outcome: Progressing Keep RUE elevated on 3 pillows while at rest. Encourage R hand digit exercises taught by OT  Garrel Ridgel, OTR/L  Pager [phone] 09/15/2011

## 2011-09-15 NOTE — Progress Notes (Signed)
PT eval is not indicated this admission

## 2011-09-15 NOTE — Plan of Care (Signed)
Problem: Discharge Progression Outcomes Goal: Tubes and drains discontinued if indicated Outcome: Completed/Met Date Met:  09/15/11 Dr. Amanda Pea removed drain prior to discharge.

## 2011-09-15 NOTE — Progress Notes (Signed)
Occupational Therapy Evaluation Patient Details Name: Cassandra Miles MRN: [id number] DOB: 05/14/1941 Today's Date: 09/15/2011 EV1 [phone] Problem List:  Patient Active Problem List  Diagnoses  . HYPERCHOLESTEROLEMIA  . OBESITY  . DEPRESSION  . RESTLESS LEGS SYNDROME  . UNSPECIFIED ESSENTIAL HYPERTENSION  . ATRIAL FIBRILLATION  . TEMPOROMANDIBULAR JOINT PAIN  . HIATAL HERNIA  . DIVERTICULOSIS, COLON  . HEPATIC CYST  . OSTEOARTHRITIS  . AVASCULAR NECROSIS  . SLEEP APNEA  . DIARRHEA  . BARRETT'S ESOPHAGUS, HX OF  . OBSTRUCTIVE SLEEP APNEA  . Rash  . Change in vision  . HTN (hypertension)  . Vitamin D deficiency    Past Medical History:  Past Medical History  Diagnosis Date  . Restless leg syndrome   . Hypertension    Past Surgical History:  Past Surgical History  Procedure Date  . Hip fracture surgery     OT Assessment/Plan/Recommendation OT Assessment Clinical Impression Statement: Pt mobilizing well & is mod I/I with all ADLs & mobility utilizing LUE. Pt educated to keep RUE elevated on 3 pillows while at rest & to complete the above stated digit exercises throughout the day. Pt will also have prn A from friends & family at d/c OT Recommendation/Assessment: Patient does not need any further OT services OT Goals    OT Evaluation Precautions/Restrictions  Restrictions Weight Bearing Restrictions: No Prior Functioning Home Living Lives With: Alone;Other (Comment) (Will be staying with a friend for a few days.) Receives Help From: Friend(s);Family Type of Home: House Home Layout: One level Home Access: Stairs to enter Entrance Stairs-Rails: Right;Left (Can reach rail on L) Entrance Stairs-Number of Steps: 5 Bathroom Shower/Tub: Health visitor: Standard Home Adaptive Equipment: Built-in shower seat;Grab bars in shower (toilet riser without rails.) Prior Function Level of Independence: Independent with basic ADLs;Independent with homemaking  with ambulation;Independent with gait;Independent with transfers Driving: Yes Vocation: Part time employment ADL ADL Grooming: Performed;Wash/dry hands;Wash/dry face;Modified independent Where Assessed - Grooming: Standing at sink Upper Body Bathing: Performed;Chest;Right arm;Left arm;Modified independent Where Assessed - Upper Body Bathing: Sitting, chair;Unsupported Lower Body Bathing: Performed;Modified independent Where Assessed - Lower Body Bathing: Sit to stand from chair Upper Body Dressing: Performed;Modified independent Where Assessed - Upper Body Dressing: Sitting, chair;Unsupported Lower Body Dressing: Performed;Modified independent Where Assessed - Lower Body Dressing: Sitting, chair;Unsupported Toilet Transfer: Performed;Independent Toilet Transfer Method: Proofreader: Regular height toilet Toileting - Clothing Manipulation: Performed;Independent Where Assessed - Toileting Clothing Manipulation: Sit to stand from 3-in-1 or toilet Toileting - Hygiene: Performed;Independent Where Assessed - Toileting Hygiene: Sit to stand from 3-in-1 or toilet Tub/Shower Transfer: Not assessed Tub/Shower Transfer Method: Not assessed ADL Comments: Pt able to successfully complete all ADLs w/ L hand, able to use R hand to stabilize/hold light objects. Pt I w/mobility and ambulation Vision/Perception    Cognition Cognition Arousal/Alertness: Awake/alert Overall Cognitive Status: Appears within functional limits for tasks assessed Orientation Level: Oriented X4 Sensation/Coordination Sensation Light Touch: Appears Intact Hot/Cold: Appears Intact Proprioception: Appears Intact Coordination Gross Motor Movements are Fluid and Coordinated: Yes Fine Motor Movements are Fluid and Coordinated: Yes Extremity Assessment RUE Assessment RUE Assessment: Not tested (due to heavy wrappings. AROM in all digits WFL) LUE Assessment LUE Assessment: Within Functional  Limits Mobility  Bed Mobility Bed Mobility: No Transfers Transfers: Yes Sit to Stand: 7: Independent;Without upper extremity assist;From chair/3-in-1;From toilet Stand to Sit: 7: Independent;With armrests;To chair/3-in-1;To toilet Exercises General Exercises - Upper Extremity Digit Composite Flexion: AROM;Right;10 reps;Seated;Other (comment) (10 reps thumb to digit opposition)  Composite Extension: AROM;Right;10 reps;Other (comment) (Educated pt to do finger exercises throughout the day) End of Session OT - End of Session Activity Tolerance: Patient tolerated treatment well Patient left: in chair;with call bell in reach;with family/visitor present General Behavior During Session: [facility] for tasks performed Cognition: [facility] for tasks performed   Anida Deol A [phone] 09/15/2011, 10:23 AM

## 2011-09-15 NOTE — Progress Notes (Signed)
Subjective: 1 Day Post-Op Procedure(s) (LRB): OPEN REDUCTION INTERNAL FIXATION (ORIF) WRIST FRACTURE (N/A) Patient reports pain as 1 on 0-10 scale.    Objective: Vital signs in last 24 hours: Temp:  [96.8 F (36 C)-98 F (36.7 C)] 97.5 F (36.4 C) (12/22 0529) Pulse Rate:  [62-96] 72  (12/22 0529) Resp:  [9-18] 16  (12/22 0529) BP: (103-155)/(55-78) 111/57 mmHg (12/22 0529) SpO2:  [92 %-100 %] 92 % (12/22 0529) Weight:  [83.462 kg (184 lb)] 184 lb (83.462 kg) (12/21 1950)  Intake/Output from previous day: 12/21 0701 - 12/22 0700 In: 1320 [P.O.:120; I.V.:1100; IV Piggyback:100] Out: 908 [Urine:900; Drains:8] Intake/Output this shift: Total I/O In: 240 [P.O.:240] Out: 100 [Urine:100]   Basename 09/14/11 1305  HGB 13.2    Basename 09/14/11 1305  WBC 12.5*  RBC 4.56  HCT 40.5  PLT 290    Basename 09/14/11 1305  NA 138  K 3.5  CL 103  CO2 25  BUN 18  CREATININE 1.00  GLUCOSE 109*  CALCIUM 9.2    Basename 09/14/11 1305  LABPT --  INR 0.98    Neurologically intact ABD soft Neurovascular intact Sensation intact distally Intact pulses distally No cellulitis present Compartment soft patient looks well she notes no problems with her upper extremity. I have discontinued her drain for her. She would like to go home today. I discussed with her all issues. She has normal radial median and ulnar nerve sensation and motor function and no evidence of infection dystrophy or nerve asked her compromise. All looks quite well. She is stable for discharge.  Assessment/Plan: 1 Day Post-Op Procedure(s) (LRB): OPEN REDUCTION INTERNAL FIXATION (ORIF) WRIST FRACTURE (N/A) Plan DC to home RTC 10-14 days .Marland KitchenKeep bandage clean and dry.  Call for any problems.  No smoking.  Criteria for driving a car: you should be off your pain medicine for 7-8 hours, able to drive one handed(confident), thinking clearly and feeling able in your judgement to drive. Continue elevation as it will  decrease swelling.  If instructed by MD move your fingers within the confines of the bandage/splint.  Use ice if instructed by your MD. Call immediately for any sudden loss of feeling in your hand/arm or change in functional abilities of the extremity. Marland Kitchen.We recommend that you to take vitamin C 1000 mg a day to promote healing we also recommend that if you require her pain medicine that he take a stool softener to prevent constipation as most pain medicines will have constipation side effects. We recommend either Peri-Colace or Senokot and recommend that you also consider adding MiraLAX to prevent the constipation affects from pain medicine if you are required to use them. These medicines are over the counter and maybe purchased at a local pharmacy.   Karen Chafe 09/15/2011, 1:09 PM

## 2011-09-21 ENCOUNTER — Encounter (HOSPITAL_COMMUNITY): Payer: Self-pay | Admitting: Orthopedic Surgery

## 2011-10-26 ENCOUNTER — Other Ambulatory Visit: Payer: Self-pay | Admitting: Internal Medicine

## 2011-10-26 NOTE — Telephone Encounter (Signed)
Rx refill sent to pharmacy. 

## 2011-12-05 ENCOUNTER — Encounter: Payer: Self-pay | Admitting: *Deleted

## 2011-12-25 ENCOUNTER — Other Ambulatory Visit: Payer: Self-pay | Admitting: Internal Medicine

## 2011-12-26 NOTE — Telephone Encounter (Signed)
Rx refill sent to pharmacy. 

## 2012-01-14 ENCOUNTER — Ambulatory Visit (INDEPENDENT_AMBULATORY_CARE_PROVIDER_SITE_OTHER): Payer: Medicare Other | Admitting: Internal Medicine

## 2012-01-14 ENCOUNTER — Encounter: Payer: Self-pay | Admitting: Internal Medicine

## 2012-01-14 VITALS — BP 132/90 | HR 71 | Temp 97.7°F | Wt 187.0 lb

## 2012-01-14 DIAGNOSIS — J329 Chronic sinusitis, unspecified: Secondary | ICD-10-CM

## 2012-01-14 DIAGNOSIS — E7800 Pure hypercholesterolemia, unspecified: Secondary | ICD-10-CM

## 2012-01-14 MED ORDER — DOXYCYCLINE HYCLATE 100 MG PO TABS: 100.0000 mg | ORAL_TABLET | Freq: Two times a day (BID) | ORAL | Status: AC

## 2012-01-14 MED ORDER — FLUTICASONE PROPIONATE 50 MCG/ACT NA SUSP: 2.0000 | Freq: Every day | NASAL | Status: DC

## 2012-01-14 NOTE — Patient Instructions (Signed)
Please schedule fasting labs prior to your next appointment Lipid/lft 272.4 and chem7-v58.69 

## 2012-01-14 NOTE — Progress Notes (Signed)
  Subjective:    Patient ID: Cassandra Miles, female    DOB: Mar 25, 1941, 71 y.o.   MRN: [id number]  HPI Pt presents to clinic for evaluation of sinus pressure. Notes ~3wk h/o green nasal drainage, congestion, maxillary sinus pressure and intermittent frontal ha's. No f/c. Has intermittent upper teeth pain. Using otc AH without significant improvement. Tolerating statin tx without myalgias. No other complaints.  Past Medical History  Diagnosis Date  . Restless leg syndrome   . Hypertension   . A-fib 2008    brief episode  . Palpitations   . Sleep apnea   . Depression   . Peptic ulcer disease   . GERD (gastroesophageal reflux disease)    Past Surgical History  Procedure Date  . Hip fracture surgery     Bilateral hip replacements  . Orif wrist fracture 09/14/2011    Procedure: OPEN REDUCTION INTERNAL FIXATION (ORIF) WRIST FRACTURE;  Surgeon: Oletta Cohn III;  Location: [facility];  Service: Orthopedics;  Laterality: N/A;    reports that she has never smoked. She has never used smokeless tobacco. She reports that she drinks alcohol. She reports that she does not use illicit drugs. family history includes Arthritis in her father; Heart disease in her brother; Hyperlipidemia in her brother; Melanoma in her brother; and Stroke in her mother. Allergies  Allergen Reactions  . Bupropion Hcl Other (See Comments)    Made her feel bad  . Ciprofloxacin Nausea And Vomiting    REACTION: hives  . Penicillins Rash    REACTION: rash     Review of Systems see hpi     Objective:   Physical Exam  Nursing note and vitals reviewed. Constitutional: She appears well-developed and well-nourished. No distress.  HENT:  Head: Normocephalic and atraumatic.  Right Ear: External ear normal.  Left Ear: External ear normal.  Nose: Right sinus exhibits maxillary sinus tenderness. Right sinus exhibits no frontal sinus tenderness. Left sinus exhibits maxillary sinus tenderness. Left sinus exhibits no  frontal sinus tenderness.  Mouth/Throat: Oropharynx is clear and moist. No oropharyngeal exudate.  Eyes: Conjunctivae and EOM are normal. No scleral icterus.  Neck: Neck supple.  Pulmonary/Chest: Effort normal and breath sounds normal. No respiratory distress. She has no wheezes. She has no rales.  Lymphadenopathy:    She has no cervical adenopathy.  Neurological: She is alert.  Skin: Skin is warm and dry. No rash noted. She is not diaphoretic.  Psychiatric: She has a normal mood and affect.          Assessment & Plan:

## 2012-01-14 NOTE — Assessment & Plan Note (Signed)
Begin abx tx with flonase daily. Followup if no improvement or worsening.

## 2012-01-14 NOTE — Assessment & Plan Note (Signed)
Schedule f/u visit with lipid/lft prior to next visit

## 2012-02-05 ENCOUNTER — Ambulatory Visit: Payer: Medicare Other | Admitting: Internal Medicine

## 2012-02-19 ENCOUNTER — Telehealth: Payer: Self-pay | Admitting: *Deleted

## 2012-02-19 DIAGNOSIS — E7800 Pure hypercholesterolemia, unspecified: Secondary | ICD-10-CM

## 2012-02-19 DIAGNOSIS — Z79899 Other long term (current) drug therapy: Secondary | ICD-10-CM

## 2012-02-19 LAB — HEPATIC FUNCTION PANEL
Albumin: 4 g/dL (ref 3.5–5.2)
Alkaline Phosphatase: 74 U/L (ref 39–117)
Indirect Bilirubin: 0.4 mg/dL (ref 0.0–0.9)
Total Bilirubin: 0.5 mg/dL (ref 0.3–1.2)

## 2012-02-19 LAB — BASIC METABOLIC PANEL
BUN: 18 mg/dL (ref 6–23)
CO2: 25 mEq/L (ref 19–32)
Chloride: 106 mEq/L (ref 96–112)
Creat: 0.91 mg/dL (ref 0.50–1.10)
Potassium: 4.2 mEq/L (ref 3.5–5.3)

## 2012-02-19 LAB — LIPID PANEL
LDL Cholesterol: 144 mg/dL — ABNORMAL HIGH (ref 0–99)
VLDL: 18 mg/dL (ref 0–40)

## 2012-02-19 NOTE — Telephone Encounter (Signed)
Pt presented to the lab. Orders entered and given to the lab per 01/14/12 office note.

## 2012-03-06 ENCOUNTER — Telehealth: Payer: Self-pay | Admitting: Internal Medicine

## 2012-03-06 ENCOUNTER — Encounter: Payer: Self-pay | Admitting: Internal Medicine

## 2012-03-06 ENCOUNTER — Ambulatory Visit (INDEPENDENT_AMBULATORY_CARE_PROVIDER_SITE_OTHER): Payer: Medicare Other | Admitting: Internal Medicine

## 2012-03-06 VITALS — BP 110/80 | HR 78 | Temp 97.9°F | Resp 18 | Ht 67.0 in | Wt 184.0 lb

## 2012-03-06 DIAGNOSIS — E7800 Pure hypercholesterolemia, unspecified: Secondary | ICD-10-CM

## 2012-03-06 DIAGNOSIS — L309 Dermatitis, unspecified: Secondary | ICD-10-CM

## 2012-03-06 DIAGNOSIS — I10 Essential (primary) hypertension: Secondary | ICD-10-CM

## 2012-03-06 DIAGNOSIS — D649 Anemia, unspecified: Secondary | ICD-10-CM

## 2012-03-06 DIAGNOSIS — Z79899 Other long term (current) drug therapy: Secondary | ICD-10-CM

## 2012-03-06 DIAGNOSIS — E559 Vitamin D deficiency, unspecified: Secondary | ICD-10-CM

## 2012-03-06 DIAGNOSIS — L259 Unspecified contact dermatitis, unspecified cause: Secondary | ICD-10-CM

## 2012-03-06 NOTE — Patient Instructions (Signed)
Please schedule fasting labs prior to next visit chem7-v58.69, lipid/lft-272.4 and vitamin d-vit d deficiency

## 2012-03-06 NOTE — Progress Notes (Signed)
  Subjective:    Patient ID: Cassandra Miles, female    DOB: [birth year], 71 y.o.   MRN: [id number]  HPI Pt presents to clinic for followup of multiple medical problems. BP reviewed normotensive. Notes patch of slightly irritated skin left foot. Using otc steroid with some improvement. No other alleviating or exacerbating factors. Cholesterol improving. Tolerating statin tx.  Past Medical History  Diagnosis Date  . Restless leg syndrome   . Hypertension   . A-fib 2008    brief episode  . Palpitations   . Sleep apnea   . Depression   . Peptic ulcer disease   . GERD (gastroesophageal reflux disease)    Past Surgical History  Procedure Date  . Hip fracture surgery     Bilateral hip replacements  . Orif wrist fracture 09/14/2011    Procedure: OPEN REDUCTION INTERNAL FIXATION (ORIF) WRIST FRACTURE;  Surgeon: Oletta Cohn III;  Location: [facility];  Service: Orthopedics;  Laterality: N/A;    reports that she has never smoked. She has never used smokeless tobacco. She reports that she drinks alcohol. She reports that she does not use illicit drugs. family history includes Arthritis in her father; Heart disease in her brother; Hyperlipidemia in her brother; Melanoma in her brother; and Stroke in her mother. Allergies  Allergen Reactions  . Bupropion Hcl Other (See Comments)    Made her feel bad  . Ciprofloxacin Nausea And Vomiting    REACTION: hives  . Penicillins Rash    REACTION: rash      Review of Systems see hpi     Objective:   Physical Exam  Physical Exam  Nursing note and vitals reviewed. Constitutional: Appears well-developed and well-nourished. No distress.  HENT:  Head: Normocephalic and atraumatic.  Right Ear: External ear normal.  Left Ear: External ear normal.  Eyes: Conjunctivae are normal. No scleral icterus.  Neck: Neck supple. Carotid bruit is not present.  Cardiovascular: Normal rate, regular rhythm and normal heart sounds.  Exam reveals no gallop and no  friction rub.   No murmur heard. Pulmonary/Chest: Effort normal and breath sounds normal. No respiratory distress. He has no wheezes. no rales.  Lymphadenopathy:    He has no cervical adenopathy.  Neurological:Alert.  Skin: Skin is warm and dry. Not diaphoretic. left foot:patch of slightly raised/sl erythematous skin. No wound/vesicles Psychiatric: Has a normal mood and affect.        Assessment & Plan:

## 2012-03-06 NOTE — Telephone Encounter (Signed)
Please schedule fasting labs prior to next visit  chem7-v58.69, lipid/lft-272.4 and vitamin d-vit d deficiency   Dr. Rodena Medin also added cbc, ferritin, and anemia on check out sheet. Patient made appointment for 08/12/12. She will be going to [facility] lab.

## 2012-03-10 DIAGNOSIS — L309 Dermatitis, unspecified: Secondary | ICD-10-CM | POA: Insufficient documentation

## 2012-03-10 NOTE — Assessment & Plan Note (Signed)
Defers prescription steroid cream. Followup if no improvement or worsening.

## 2012-03-10 NOTE — Assessment & Plan Note (Signed)
Improving. Continue statin. Low fat diet, exercise and wt loss recommended.

## 2012-03-10 NOTE — Assessment & Plan Note (Signed)
Normotensive and stable. Continue current regimen. Monitor bp as outpt and followup in clinic as scheduled.  

## 2012-05-19 ENCOUNTER — Other Ambulatory Visit: Payer: Self-pay | Admitting: Internal Medicine

## 2012-05-19 NOTE — Telephone Encounter (Signed)
Rx done/SLs

## 2012-06-05 ENCOUNTER — Other Ambulatory Visit: Payer: Self-pay | Admitting: Internal Medicine

## 2012-07-01 ENCOUNTER — Encounter: Payer: Self-pay | Admitting: Internal Medicine

## 2012-07-01 ENCOUNTER — Ambulatory Visit (INDEPENDENT_AMBULATORY_CARE_PROVIDER_SITE_OTHER): Payer: Medicare Other | Admitting: Internal Medicine

## 2012-07-01 VITALS — BP 126/88 | HR 62 | Temp 97.7°F | Wt 186.0 lb

## 2012-07-01 DIAGNOSIS — I10 Essential (primary) hypertension: Secondary | ICD-10-CM

## 2012-07-01 DIAGNOSIS — R21 Rash and other nonspecific skin eruption: Secondary | ICD-10-CM

## 2012-07-01 MED ORDER — TRIAMCINOLONE ACETONIDE 0.1 % EX CREA: TOPICAL_CREAM | Freq: Two times a day (BID) | CUTANEOUS | Status: DC

## 2012-07-01 NOTE — Progress Notes (Signed)
  Subjective:    Patient ID: Cassandra Miles, female    DOB: 13-May-1941, 71 y.o.   MRN: [id number]  HPI patient presents to clinic for evaluation of rash. Notes an approximate two-month history of rash located in the upper gluteal cleft. Denies rash on extensor surfaces. Rash itches but has no associated pain. Was initially erythematous and now has turned darker. There's been no spread. Attempted hydrocortisone OTC without improvement. No other alleviating or exacerbating factors. Has stopped the blood pressure medication as well as cholesterol medication. Did not stop because of side effects.  Past Medical History  Diagnosis Date  . Restless leg syndrome   . Hypertension   . A-fib 2008    brief episode  . Palpitations   . Sleep apnea   . Depression   . Peptic ulcer disease   . GERD (gastroesophageal reflux disease)    Past Surgical History  Procedure Date  . Hip fracture surgery     Bilateral hip replacements  . Orif wrist fracture 09/14/2011    Procedure: OPEN REDUCTION INTERNAL FIXATION (ORIF) WRIST FRACTURE;  Surgeon: Oletta Cohn III;  Location: [facility];  Service: Orthopedics;  Laterality: N/A;    reports that she has never smoked. She has never used smokeless tobacco. She reports that she drinks alcohol. She reports that she does not use illicit drugs. family history includes Arthritis in her father; Heart disease in her brother; Hyperlipidemia in her brother; Melanoma in her brother; and Stroke in her mother. Allergies  Allergen Reactions  . Bupropion Hcl Other (See Comments)    Made her feel bad  . Ciprofloxacin Nausea And Vomiting    REACTION: hives  . Penicillins Rash    REACTION: rash     Review of Systems see history of present illness     Objective:   Physical Exam  Nursing note and vitals reviewed. Constitutional: She appears well-developed and well-nourished. No distress.  HENT:  Head: Normocephalic and atraumatic.  Neurological: She is alert.  Skin:  Skin is warm and dry. Rash noted. She is not diaphoretic.       Gluteal cleft examined with patch of dusky? Plaque with intermingle papules. No drainage or discharge. No vesicular component.  Psychiatric: She has a normal mood and affect.          Assessment & Plan:

## 2012-07-01 NOTE — Assessment & Plan Note (Signed)
Given location of gluteal cleft consider possible psoriasis however no other rashes noted especially extensor surfaces. Attempt triamcinolone. Consider dermatology referral if no resolution

## 2012-07-01 NOTE — Assessment & Plan Note (Signed)
Off medications self DC'd. Recommend out patient blood pressure log

## 2012-08-01 ENCOUNTER — Encounter: Payer: Self-pay | Admitting: *Deleted

## 2012-08-11 ENCOUNTER — Telehealth: Payer: Self-pay | Admitting: Internal Medicine

## 2012-08-11 NOTE — Telephone Encounter (Signed)
Opened in error

## 2012-08-12 ENCOUNTER — Encounter: Payer: Self-pay | Admitting: Internal Medicine

## 2012-08-12 ENCOUNTER — Ambulatory Visit: Payer: Medicare Other | Admitting: Internal Medicine

## 2012-08-12 ENCOUNTER — Ambulatory Visit (INDEPENDENT_AMBULATORY_CARE_PROVIDER_SITE_OTHER): Payer: Medicare Other | Admitting: Internal Medicine

## 2012-08-12 VITALS — BP 182/104 | HR 72 | Temp 97.6°F | Resp 16 | Wt 186.2 lb

## 2012-08-12 DIAGNOSIS — Z79899 Other long term (current) drug therapy: Secondary | ICD-10-CM

## 2012-08-12 DIAGNOSIS — I10 Essential (primary) hypertension: Secondary | ICD-10-CM

## 2012-08-12 DIAGNOSIS — D649 Anemia, unspecified: Secondary | ICD-10-CM

## 2012-08-12 DIAGNOSIS — E7800 Pure hypercholesterolemia, unspecified: Secondary | ICD-10-CM

## 2012-08-12 DIAGNOSIS — E559 Vitamin D deficiency, unspecified: Secondary | ICD-10-CM

## 2012-08-12 LAB — BASIC METABOLIC PANEL
CO2: 29 mEq/L (ref 19–32)
Calcium: 9.5 mg/dL (ref 8.4–10.5)
Chloride: 103 mEq/L (ref 96–112)
Creat: 0.98 mg/dL (ref 0.50–1.10)
Glucose, Bld: 98 mg/dL (ref 70–99)

## 2012-08-12 LAB — CBC
HCT: 41.7 % (ref 36.0–46.0)
Hemoglobin: 14.3 g/dL (ref 12.0–15.0)
MCHC: 34.3 g/dL (ref 30.0–36.0)
RBC: 4.79 MIL/uL (ref 3.87–5.11)

## 2012-08-12 MED ORDER — AMLODIPINE BESYLATE 5 MG PO TABS: 5.0000 mg | ORAL_TABLET | Freq: Every day | ORAL | Status: DC

## 2012-08-12 NOTE — Assessment & Plan Note (Signed)
Obtain lipid/lft. 

## 2012-08-12 NOTE — Progress Notes (Signed)
  Subjective:    Patient ID: Cassandra Miles, female    DOB: Jun 09, 1941, 71 y.o.   MRN: [id number]  HPI Pt presents to clinic for evaluation of elevated BP. H/o HTN previously tx'ed with ace inhibitor but pt was able to successfully stop. Maintained normotensive control until recently. Home bp log reviewed with recent significant elevations. Has chronic diffuse intermittent scalp paresthesia but no weakness, change in speech/vision.   Past Medical History  Diagnosis Date  . Restless leg syndrome   . Hypertension   . A-fib 2008    brief episode  . Palpitations   . Sleep apnea   . Depression   . Peptic ulcer disease   . GERD (gastroesophageal reflux disease)   . Barrett esophagus   . Avascular necrosis   . Osteoarthritis   . Hepatic cyst    Past Surgical History  Procedure Date  . Hip fracture surgery     Bilateral hip replacements  . Orif wrist fracture 09/14/2011    Procedure: OPEN REDUCTION INTERNAL FIXATION (ORIF) WRIST FRACTURE;  Surgeon: Oletta Cohn III;  Location: [facility];  Service: Orthopedics;  Laterality: N/A;  . Cholecystectomy   . Tubal ligation   . Decompression core hip     bilateral hips  . Breast reduction surgery     reports that she has never smoked. She has never used smokeless tobacco. She reports that she drinks alcohol. She reports that she does not use illicit drugs. family history includes Arthritis in her father; Colon cancer in her paternal aunt; Diabetes in her brother; Heart disease in her brother and mother; Hyperlipidemia in her brother; Melanoma in her brother; and Stroke in her mother. Allergies  Allergen Reactions  . Bupropion Hcl Other (See Comments)    Made her feel bad  . Ciprofloxacin Nausea And Vomiting    REACTION: hives  . Penicillins Rash    REACTION: rash     Review of Systems  Respiratory: Negative for shortness of breath.   Cardiovascular: Negative for chest pain.  Neurological: Negative for facial asymmetry and weakness.   see hpi     Objective:   Physical Exam  Nursing note and vitals reviewed. Constitutional: She appears well-developed and well-nourished. No distress.  HENT:  Head: Normocephalic and atraumatic.  Right Ear: External ear normal.  Left Ear: External ear normal.  Eyes: Conjunctivae normal and EOM are normal. Pupils are equal, round, and reactive to light. No scleral icterus.  Fundoscopic exam:      The right eye shows no hemorrhage and no papilledema.       The left eye shows no hemorrhage and no papilledema.  Neck: Neck supple. No JVD present. Carotid bruit is not present.  Cardiovascular: Normal rate, regular rhythm and normal heart sounds.  Exam reveals no gallop and no friction rub.   No murmur heard. Pulmonary/Chest: Effort normal and breath sounds normal. No respiratory distress. She has no wheezes. She has no rales.  Neurological: She is alert. No cranial nerve deficit. Coordination normal.  Skin: Skin is warm and dry. She is not diaphoretic.  Psychiatric: She has a normal mood and affect.          Assessment & Plan:

## 2012-08-12 NOTE — Assessment & Plan Note (Signed)
Poor control. Asx. Begin norvasc 5mg  po qd. Maintain outpt bp log. Obtain chem7. Schedule close f/u in 6 days or sooner if needed.

## 2012-08-13 LAB — HEPATIC FUNCTION PANEL
AST: 17 U/L (ref 0–37)
Alkaline Phosphatase: 89 U/L (ref 39–117)
Bilirubin, Direct: 0.1 mg/dL (ref 0.0–0.3)
Total Bilirubin: 0.4 mg/dL (ref 0.3–1.2)

## 2012-08-13 LAB — FERRITIN: Ferritin: 43 ng/mL (ref 10–291)

## 2012-08-13 LAB — VITAMIN D 25 HYDROXY (VIT D DEFICIENCY, FRACTURES): Vit D, 25-Hydroxy: 25 ng/mL — ABNORMAL LOW (ref 30–89)

## 2012-08-13 LAB — LIPID PANEL
LDL Cholesterol: 172 mg/dL — ABNORMAL HIGH (ref 0–99)
Total CHOL/HDL Ratio: 3.8 Ratio

## 2012-08-14 ENCOUNTER — Telehealth: Payer: Self-pay | Admitting: *Deleted

## 2012-08-14 NOTE — Telephone Encounter (Signed)
Received message from pt that her BP seems to come down in to the normal range about 1-2 hours after taking Amlodipine but goes back up and is staying in the 180s/90s. Pt states she knows it may take some time for her BP to normalize but wanted to know if you have any further instructions?  Please advise.

## 2012-08-14 NOTE — Telephone Encounter (Signed)
Notified pt and she voices understanding. 

## 2012-08-14 NOTE — Telephone Encounter (Signed)
Change norvasc from 5mg  qd to 5mg  bid. Monitor bp carefully

## 2012-08-19 ENCOUNTER — Encounter: Payer: Self-pay | Admitting: Internal Medicine

## 2012-08-19 ENCOUNTER — Telehealth: Payer: Self-pay | Admitting: Internal Medicine

## 2012-08-19 ENCOUNTER — Ambulatory Visit (INDEPENDENT_AMBULATORY_CARE_PROVIDER_SITE_OTHER): Payer: Medicare Other | Admitting: Internal Medicine

## 2012-08-19 VITALS — BP 158/98 | HR 71 | Temp 97.9°F | Resp 16 | Wt 187.5 lb

## 2012-08-19 DIAGNOSIS — E7800 Pure hypercholesterolemia, unspecified: Secondary | ICD-10-CM

## 2012-08-19 DIAGNOSIS — I10 Essential (primary) hypertension: Secondary | ICD-10-CM

## 2012-08-19 DIAGNOSIS — E785 Hyperlipidemia, unspecified: Secondary | ICD-10-CM

## 2012-08-19 MED ORDER — AMLODIPINE BESYLATE 5 MG PO TABS: 5.0000 mg | ORAL_TABLET | Freq: Two times a day (BID) | ORAL | Status: DC

## 2012-08-19 MED ORDER — LISINOPRIL 10 MG PO TABS: 10.0000 mg | ORAL_TABLET | Freq: Every day | ORAL | Status: DC

## 2012-08-19 MED ORDER — PRAVASTATIN SODIUM 20 MG PO TABS: 20.0000 mg | ORAL_TABLET | Freq: Every day | ORAL | Status: DC

## 2012-08-19 NOTE — Assessment & Plan Note (Signed)
Begin pravastatin 20 mg a day. Obtain fasting lipid profile and liver function tests in approximately 6 weeks.

## 2012-08-19 NOTE — Assessment & Plan Note (Signed)
Improving but suboptimal control. Asymptomatic. Continue Norvasc dosing. Add lisinopril 10 mg a day. Continue to monitor blood pressure as an outpatient and followup in approximate 6 weeks.

## 2012-08-19 NOTE — Patient Instructions (Addendum)
Please return to lab fasting approximately 4-6 weeks after beginning the cholesterol medication (lipid/lft-272.4 and chem7-v58.69)

## 2012-08-19 NOTE — Progress Notes (Signed)
  Subjective:    Patient ID: Cassandra Miles, female    DOB: 1940-10-08, 71 y.o.   MRN: [id number]  HPI Pt presents to clinic for follow up of HTN. Recently begun on norvasc and tolerates without adverse effect. Reviewed home blood pressure log with slowly improving systolic and diastolic but remains elevated. Has had some readings as low as 130s 140s systolic and upper 80s diastolic. Also reviewed persistently elevated total cholesterol and LDL.  Past Medical History  Diagnosis Date  . Restless leg syndrome   . Hypertension   . A-fib 2008    brief episode  . Palpitations   . Sleep apnea   . Depression   . Peptic ulcer disease   . GERD (gastroesophageal reflux disease)   . Barrett esophagus   . Avascular necrosis   . Osteoarthritis   . Hepatic cyst    Past Surgical History  Procedure Date  . Hip fracture surgery     Bilateral hip replacements  . Orif wrist fracture 09/14/2011    Procedure: OPEN REDUCTION INTERNAL FIXATION (ORIF) WRIST FRACTURE;  Surgeon: Oletta Cohn III;  Location: [facility];  Service: Orthopedics;  Laterality: N/A;  . Cholecystectomy   . Tubal ligation   . Decompression core hip     bilateral hips  . Breast reduction surgery     reports that she has never smoked. She has never used smokeless tobacco. She reports that she drinks alcohol. She reports that she does not use illicit drugs. family history includes Arthritis in her father; Colon cancer in her paternal aunt; Diabetes in her brother; Heart disease in her brother and mother; Hyperlipidemia in her brother; Melanoma in her brother; and Stroke in her mother. Allergies  Allergen Reactions  . Bupropion Hcl Other (See Comments)    Made her feel bad  . Ciprofloxacin Nausea And Vomiting    REACTION: hives  . Penicillins Rash    REACTION: rash     Review of Systems see hpi     Objective:   Physical Exam  Nursing note and vitals reviewed. Constitutional: She appears well-developed and well-nourished.  No distress.  HENT:  Head: Normocephalic and atraumatic.  Eyes: Conjunctivae normal are normal. No scleral icterus.  Neck: Carotid bruit is not present.  Cardiovascular: Normal rate, regular rhythm and normal heart sounds.  Exam reveals no gallop and no friction rub.   No murmur heard. Pulmonary/Chest: Effort normal and breath sounds normal.  Neurological: She is alert.  Skin: She is not diaphoretic.  Psychiatric: She has a normal mood and affect.          Assessment & Plan:

## 2012-08-19 NOTE — Telephone Encounter (Signed)
Lab order week of 09-22-2012 (lipid/lft-272.4)

## 2012-08-27 ENCOUNTER — Ambulatory Visit (INDEPENDENT_AMBULATORY_CARE_PROVIDER_SITE_OTHER): Payer: Medicare Other | Admitting: Internal Medicine

## 2012-08-27 ENCOUNTER — Encounter: Payer: Self-pay | Admitting: Internal Medicine

## 2012-08-27 VITALS — BP 102/64 | HR 69 | Ht 67.0 in | Wt 187.8 lb

## 2012-08-27 DIAGNOSIS — K2270 Barrett's esophagus without dysplasia: Secondary | ICD-10-CM

## 2012-08-27 NOTE — Progress Notes (Signed)
Cassandra Miles Oct 07, 1940 MRN [id number]   History of Present Illness:  This is a 71 year old white female with history of Barrett's esophagus and gastroesophageal reflux. Her last appointment was in September 2011. Her last upper endoscopy was in August 2010 in [place] by Dr Clearence Cheek and it showed focal goblet cells and irregular Z line as well as heterotropic gastric mucosa at 18 cm. She is here today to discuss a repeat upper endoscopy. She is currently on no medications for acid reflux. She takes TUMS and Rolaids on an as necessary basis. She has occasional dysphagia to solids. Her last colonoscopy was in 2009 and prior to that in December 2008. There is a history of a common bile duct stone in 2004. She underwent an ERCP by Dr.Hayes with placement of biliary stent which was later removed. She underwent a cholecystectomy in 2004. She has a positive family history of colon cancer in her paternal aunt.    Past Medical History  Diagnosis Date  . Restless leg syndrome   . Hypertension   . A-fib 2008    brief episode  . Palpitations   . Sleep apnea   . Depression   . Peptic ulcer disease   . GERD (gastroesophageal reflux disease)   . Barrett esophagus   . Avascular necrosis   . Osteoarthritis   . Hepatic cyst    Past Surgical History  Procedure Date  . Hip fracture surgery     Bilateral hip replacements  . Orif wrist fracture 09/14/2011    Procedure: OPEN REDUCTION INTERNAL FIXATION (ORIF) WRIST FRACTURE;  Surgeon: Oletta Cohn III;  Location: [facility];  Service: Orthopedics;  Laterality: N/A;  . Cholecystectomy   . Tubal ligation   . Decompression core hip     bilateral hips  . Breast reduction surgery     reports that she has never smoked. She has never used smokeless tobacco. She reports that she drinks alcohol. She reports that she does not use illicit drugs. family history includes Arthritis in her father; Colon cancer in her paternal aunt; Diabetes in her brother; Heart  disease in her brother and mother; Hyperlipidemia in her brother; Melanoma in her brother; and Stroke in her mother. Allergies  Allergen Reactions  . Bupropion Hcl Other (See Comments)    Made her feel bad  . Ciprofloxacin Nausea And Vomiting    REACTION: hives  . Penicillins Rash    REACTION: rash        Review of Systems:Positive for intermittent dysphagia. Positive for heartburn  The remainder of the 10 point ROS is negative except as outlined in H&P   Physical Exam: General appearance  Well developed, in no distress. Eyes- non icteric. HEENT nontraumatic, normocephalic. Mouth no lesions, tongue papillated, no cheilosis. Neck supple without adenopathy, thyroid not enlarged, no carotid bruits, no JVD. Lungs Clear to auscultation bilaterally. Cor normal S1, normal S2, regular rhythm, no murmur,  quiet precordium. Abdomen: Soft with minimal tenderness in subxiphoid area. Normal active bowel sounds. No distention.  Rectal:Not done. Extremities no pedal edema. Skin no lesions. Neurological alert and oriented x 3. Psychological normal mood and affect.  Assessment and Plan:  Problem #1 Chronic gastroesophageal reflux. Patient is currently on no medications. She has a past diagnosis of Barrett's esophagus which needs to be followed up endoscopically. We will schedule her for an upper endoscopy and then decide if she needs a long-term acid reducing regimen.  Problem #2 colorectal screening. She is up-to-date on her colonoscopy. Her  last exam was in 2009. Her next exam was in 2019.   08/27/2012 Lina Sar

## 2012-08-27 NOTE — Patient Instructions (Addendum)
You have been scheduled for an endoscopy with propofol. Please follow written instructions given to you at your visit today. If you use inhalers (even only as needed) or a CPAP machine, please bring them with you on the day of your procedure.  CC: Dr Charlynn Court

## 2012-09-23 LAB — LIPID PANEL
Cholesterol: 228 mg/dL — ABNORMAL HIGH (ref 0–200)
HDL: 66 mg/dL (ref >39)
Total CHOL/HDL Ratio: 3.5 Ratio
Triglycerides: 88 mg/dL (ref <150)

## 2012-09-23 LAB — HEPATIC FUNCTION PANEL
ALT: 15 U/L (ref 0–35)
AST: 22 U/L (ref 0–37)
Albumin: 3.8 g/dL (ref 3.5–5.2)

## 2012-09-23 NOTE — Telephone Encounter (Signed)
Lab order placed & released/SLS

## 2012-09-30 ENCOUNTER — Ambulatory Visit: Payer: Medicare Other | Admitting: Internal Medicine

## 2012-10-07 ENCOUNTER — Encounter: Payer: Self-pay | Admitting: Internal Medicine

## 2012-10-07 ENCOUNTER — Ambulatory Visit (AMBULATORY_SURGERY_CENTER): Payer: Medicare Other | Admitting: Internal Medicine

## 2012-10-07 VITALS — BP 136/76 | HR 67 | Temp 97.9°F | Resp 12 | Ht 67.0 in | Wt 187.0 lb

## 2012-10-07 DIAGNOSIS — K222 Esophageal obstruction: Secondary | ICD-10-CM

## 2012-10-07 DIAGNOSIS — K2960 Other gastritis without bleeding: Secondary | ICD-10-CM

## 2012-10-07 DIAGNOSIS — G2581 Restless legs syndrome: Secondary | ICD-10-CM

## 2012-10-07 DIAGNOSIS — K2270 Barrett's esophagus without dysplasia: Secondary | ICD-10-CM

## 2012-10-07 MED ORDER — OMEPRAZOLE 40 MG PO CPDR: DELAYED_RELEASE_CAPSULE | ORAL | Status: DC

## 2012-10-07 MED ORDER — SODIUM CHLORIDE 0.9 % IV SOLN: 500.0000 mL | INTRAVENOUS | Status: DC

## 2012-10-07 NOTE — Progress Notes (Signed)
Pt stable to RR 

## 2012-10-07 NOTE — Patient Instructions (Addendum)
YOU HAD AN ENDOSCOPIC PROCEDURE TODAY AT THE [facility]: Refer to the procedure report that was given to you for any specific questions about what was found during the examination.  If the procedure report does not answer your questions, please call your gastroenterologist to clarify.  If you requested that your care partner not be given the details of your procedure findings, then the procedure report has been included in a sealed envelope for you to review at your convenience later.  YOU SHOULD EXPECT: Some feelings of bloating in the abdomen. Passage of more gas than usual.  Walking can help get rid of the air that was put into your GI tract during the procedure and reduce the bloating. If you had a lower endoscopy (such as a colonoscopy or flexible sigmoidoscopy) you may notice spotting of blood in your stool or on the toilet paper. If you underwent a bowel prep for your procedure, then you may not have a normal bowel movement for a few days.  DIET: see below  ACTIVITY: Your care partner should take you home directly after the procedure.  You should plan to take it easy, moving slowly for the rest of the day.  You can resume normal activity the day after the procedure however you should NOT DRIVE or use heavy machinery for 24 hours (because of the sedation medicines used during the test).    SYMPTOMS TO REPORT IMMEDIATELY: A gastroenterologist can be reached at any hour.  During normal business hours, 8:30 AM to 5:00 PM Monday through Friday, call [phone].  After hours and on weekends, please call the GI answering service at [phone] who will take a message and have the physician on call contact you.   Following upper endoscopy (EGD)  Vomiting of blood or coffee ground material  New chest pain or pain under the shoulder blades  Painful or persistently difficult swallowing  New shortness of breath  Fever of 100F or higher  Black, tarry-looking stools  FOLLOW UP: If  any biopsies were taken you will be contacted by phone or by letter within the next 1-3 weeks.  Call your gastroenterologist if you have not heard about the biopsies in 3 weeks.  Our staff will call the home number listed on your records the next business day following your procedure to check on you and address any questions or concerns that you may have at that time regarding the information given to you following your procedure. This is a courtesy call and so if there is no answer at the home number and we have not heard from you through the emergency physician on call, we will assume that you have returned to your regular daily activities without incident.  SIGNATURES/CONFIDENTIALITY: You and/or your care partner have signed paperwork which will be entered into your electronic medical record.  These signatures attest to the fact that that the information above on your After Visit Summary has been reviewed and is understood.  Full responsibility of the confidentiality of this discharge information lies with you and/or your care-partner.   Wait pathology report  Anti-reflux regimen-handout given  Dilation diet-handout given  Stricture-handout given  Hiatal Hernia  Repeat exam in 2 Years

## 2012-10-07 NOTE — Progress Notes (Signed)
Called to room to assist during endoscopic procedure.  Patient ID and intended procedure confirmed with present staff. Received instructions for my participation in the procedure from the performing physician.  

## 2012-10-07 NOTE — Progress Notes (Signed)
Patient did not experience any of the following events: a burn prior to discharge; a fall within the facility; wrong site/side/patient/procedure/implant event; or a hospital transfer or hospital admission upon discharge from the facility. (G8907) Patient did not have preoperative order for IV antibiotic SSI prophylaxis. (G8918)  

## 2012-10-07 NOTE — Progress Notes (Signed)
NO EGG OR SOY ALLERGY. EWM 

## 2012-10-07 NOTE — Op Note (Signed)
[facility] [address] [place], [address]   ENDOSCOPY PROCEDURE REPORT  PATIENT: Cassandra Miles, Cassandra Miles  MR#: [id number] BIRTHDATE: Jul 16, 1941 , 71  yrs. old GENDER: Female ENDOSCOPIST: Hart Carwin, MD REFERRED BY:  Charlynn Court, M.D. PROCEDURE DATE:  10/07/2012 PROCEDURE:  EGD w/ biopsy and Savary dilation of esophagus ASA CLASS:     Class II INDICATIONS:  history of Barrett's esophagus.   History of esophageal reflux.   Barrett's on EGD 2009 and 2010. MEDICATIONS: MAC sedation, administered by CRNA and propofol (Diprivan) 250mg  IV TOPICAL ANESTHETIC: none  DESCRIPTION OF PROCEDURE: After the risks benefits and alternatives of the procedure were thoroughly explained, informed consent was obtained.  The LB GIF-H180 [id number] endoscope was introduced through the mouth and advanced to the second portion of the duodenum. Without limitations.  The instrument was slowly withdrawn as the mucosa was fully examined.        ESOPHAGUS: There was evidence of suspected Barrett's esophagus at the gastroesophageal junction.  A biopsy was performed using cold forceps.  5 mm long linear erosions within fibrotic stricture, which allowed the scope to pass without resistance, 3 cm non treducible hiatal hernia, Stricture dilated with 16 mm Savary dilator, there was no  blood on the dilator.  Retroflexion was normal        The scope was then withdrawn from the patient and the procedure completed.  COMPLICATIONS: There were no complications. ENDOSCOPIC IMPRESSION: There was evidence of suspected Barrett's esophagus; biopsy taken Grade A esophagitia Mild esophageal stricture, s/p dilation to 16 mm 3-4 cm nonreducible hiatal hernia  RECOMMENDATIONS: 1.  Await pathology results 2.  Anti-reflux regimen to be follow 3.  Continue PPI  REPEAT EXAM: In 2 year(s)  for EGD .  eSigned:  Hart Carwin, MD 10/07/2012 5:06 PM   CC:  PATIENT NAME:  Rylynne, Schicker MR#:  [id number]

## 2012-10-08 ENCOUNTER — Telehealth: Payer: Self-pay | Admitting: *Deleted

## 2012-10-08 NOTE — Telephone Encounter (Signed)
  Follow up Call-  Call back number 10/07/2012  Post procedure Call Back phone  # [phone] OR [phone] NOT SURE IF [phone] OR [phone]  Permission to leave phone message Yes   Pt answered phone after message started to be left.   No pain, questions, concerns; able to eat

## 2012-10-09 ENCOUNTER — Encounter: Payer: Self-pay | Admitting: Internal Medicine

## 2012-10-09 ENCOUNTER — Ambulatory Visit (INDEPENDENT_AMBULATORY_CARE_PROVIDER_SITE_OTHER): Payer: Medicare Other | Admitting: Internal Medicine

## 2012-10-09 VITALS — BP 128/92 | HR 74 | Temp 97.7°F | Resp 16 | Wt 190.8 lb

## 2012-10-09 DIAGNOSIS — L259 Unspecified contact dermatitis, unspecified cause: Secondary | ICD-10-CM

## 2012-10-09 DIAGNOSIS — I10 Essential (primary) hypertension: Secondary | ICD-10-CM

## 2012-10-09 DIAGNOSIS — E7800 Pure hypercholesterolemia, unspecified: Secondary | ICD-10-CM

## 2012-10-09 DIAGNOSIS — L309 Dermatitis, unspecified: Secondary | ICD-10-CM

## 2012-10-09 MED ORDER — LISINOPRIL 20 MG PO TABS: 20.0000 mg | ORAL_TABLET | Freq: Every day | ORAL | Status: DC

## 2012-10-09 MED ORDER — FLUOCINONIDE-E 0.05 % EX CREA: TOPICAL_CREAM | Freq: Two times a day (BID) | CUTANEOUS | Status: DC

## 2012-10-09 MED ORDER — PRAVASTATIN SODIUM 40 MG PO TABS: 40.0000 mg | ORAL_TABLET | Freq: Every day | ORAL | Status: DC

## 2012-10-09 NOTE — Assessment & Plan Note (Signed)
Improved but suboptimal control. Increase pravastatin to 40mg  po qd. Obtain lipid/lft prior to next visit

## 2012-10-09 NOTE — Progress Notes (Signed)
  Subjective:    Patient ID: Cassandra Miles, female    DOB: May 09, 1941, 72 y.o.   MRN: [id number]  HPI Pt presents to clinic for followup of multiple medical problems. Home BP's have been elevated generally 140's to 160 with nl diastolics. Tolerating medication without side effect. Reviewed improved tchol and ldl but above goal. Notes several month h/o left dorsal foot rash. No obvious trigger and not spreading. Failing triamcinolone.  Past Medical History  Diagnosis Date  . Restless leg syndrome   . Hypertension   . A-fib 2008    brief episode  . Palpitations   . Sleep apnea   . Depression   . Peptic ulcer disease   . GERD (gastroesophageal reflux disease)   . Barrett esophagus   . Avascular necrosis   . Osteoarthritis   . Hepatic cyst   . Hyperlipidemia   . Blood transfusion without reported diagnosis     WITH  HIP REPLACEMENT 06,07  . Cataract    Past Surgical History  Procedure Date  . Hip fracture surgery     Bilateral hip replacements  . Orif wrist fracture 09/14/2011    Procedure: OPEN REDUCTION INTERNAL FIXATION (ORIF) WRIST FRACTURE;  Surgeon: Oletta Cohn III;  Location: [facility];  Service: Orthopedics;  Laterality: N/A;  . Cholecystectomy   . Tubal ligation   . Decompression core hip     bilateral hips  . Breast reduction surgery   . Colonoscopy     reports that she has never smoked. She has never used smokeless tobacco. She reports that she drinks alcohol. She reports that she does not use illicit drugs. family history includes Arthritis in her father; Colon cancer in her paternal aunt; Diabetes in her brother; Heart disease in her brother and mother; Hyperlipidemia in her brother; Melanoma in her brother; and Stroke in her mother. Allergies  Allergen Reactions  . Bupropion Hcl Other (See Comments)    Made her feel bad  . Ciprofloxacin Nausea And Vomiting    REACTION: hives  . Penicillins Rash    REACTION: rash      Review of Systems see hpi       Objective:   Physical Exam  Nursing note and vitals reviewed. Constitutional: She appears well-developed and well-nourished. No distress.  HENT:  Head: Normocephalic and atraumatic.  Eyes: Conjunctivae normal are normal. No scleral icterus.  Neck: Neck supple.  Cardiovascular: Normal rate, regular rhythm and normal heart sounds.   Pulmonary/Chest: Effort normal and breath sounds normal. No respiratory distress. She has no wheezes. She has no rales.  Skin: Skin is warm and dry. She is not diaphoretic.       Left dorsal foot- ~2cm raised minimally erythematous rash  Psychiatric: She has a normal mood and affect.          Assessment & Plan:

## 2012-10-09 NOTE — Patient Instructions (Signed)
Please schedule fasting labs prior to your next visit Lipid/lft-272.4 and chem7-v58.69

## 2012-10-09 NOTE — Assessment & Plan Note (Signed)
Stop triamcinolone. Attempt lidex. Followup if no improvement or worsening.

## 2012-10-09 NOTE — Assessment & Plan Note (Signed)
suboptimal control. Increase lisinopril 20mg  qd. Monitor bp as outpt and f/u as scheduled. Obtain chem7 prior to next visit

## 2012-10-13 ENCOUNTER — Encounter: Payer: Self-pay | Admitting: Internal Medicine

## 2012-10-31 ENCOUNTER — Telehealth: Payer: Self-pay | Admitting: Internal Medicine

## 2012-10-31 NOTE — Telephone Encounter (Signed)
FAXED RECORDS TO [facility] [employer] 10-31-2012

## 2012-11-09 ENCOUNTER — Other Ambulatory Visit: Payer: Self-pay | Admitting: Internal Medicine

## 2012-11-11 ENCOUNTER — Ambulatory Visit (INDEPENDENT_AMBULATORY_CARE_PROVIDER_SITE_OTHER): Payer: Medicare Other | Admitting: Family

## 2012-11-11 ENCOUNTER — Encounter: Payer: Self-pay | Admitting: Family

## 2012-11-11 VITALS — BP 144/90 | HR 69 | Temp 97.4°F | Resp 16 | Ht 67.01 in | Wt 189.0 lb

## 2012-11-11 DIAGNOSIS — R011 Cardiac murmur, unspecified: Secondary | ICD-10-CM

## 2012-11-11 DIAGNOSIS — R1013 Epigastric pain: Secondary | ICD-10-CM

## 2012-11-11 DIAGNOSIS — R10816 Epigastric abdominal tenderness: Secondary | ICD-10-CM

## 2012-11-11 MED ORDER — LISINOPRIL 20 MG PO TABS: 20.0000 mg | ORAL_TABLET | Freq: Every day | ORAL | Status: DC

## 2012-11-11 MED ORDER — PRAVASTATIN SODIUM 40 MG PO TABS: 40.0000 mg | ORAL_TABLET | Freq: Every day | ORAL | Status: DC

## 2012-11-11 NOTE — Progress Notes (Signed)
Subjective:    Patient ID: Cassandra Miles, female    DOB: [birth year], 72 y.o.   MRN: [id number]  HPI  Pt had endoscopy 4 weeks ago Lina Sar).  Was told gastritis and some lesions in the esophagus. She reports that about 3 weeks ago when she was at [employer] she develpped some upper abdominal discomfort. She reports a "funny feeling" around my naval.  Also feels bloated. She reports that she does not have a great appetite, but she continues to eat.  She has hx of hiatal hernia.  She denies vomitting, but notes some nausea which is not helped by eating. Notes increased belching. She reports soft BM's TID which is about normal for her.  She is s/p cholecystectomy.    Review of Systems See HPI  Past Medical History  Diagnosis Date  . Restless leg syndrome   . Hypertension   . A-fib 2008    brief episode  . Palpitations   . Sleep apnea   . Depression   . Peptic ulcer disease   . GERD (gastroesophageal reflux disease)   . Barrett esophagus   . Avascular necrosis   . Osteoarthritis   . Hepatic cyst   . Hyperlipidemia   . Blood transfusion without reported diagnosis     WITH  HIP REPLACEMENT 06,07  . Cataract     History   Social History  . Marital Status: Divorced    Spouse Name: N/A    Number of Children: N/A  . Years of Education: N/A   Occupational History  . retired     Runner, broadcasting/film/video   Social History Main Topics  . Smoking status: Never Smoker   . Smokeless tobacco: Never Used  . Alcohol Use: Yes     Comment: SOCIAL  . Drug Use: No  . Sexually Active: Not on file   Other Topics Concern  . Not on file   Social History Narrative   Moved from [place], [place]    Past Surgical History  Procedure Laterality Date  . Hip fracture surgery      Bilateral hip replacements  . Orif wrist fracture  09/14/2011    Procedure: OPEN REDUCTION INTERNAL FIXATION (ORIF) WRIST FRACTURE;  Surgeon: Oletta Cohn III;  Location: [facility];  Service: Orthopedics;  Laterality: N/A;  .  Cholecystectomy    . Tubal ligation    . Decompression core hip      bilateral hips  . Breast reduction surgery    . Colonoscopy      Family History  Problem Relation Age of Onset  . Arthritis Father   . Stroke Mother     x2  . Heart disease Brother     Stenting   . Hyperlipidemia Brother   . Melanoma Brother   . Colon cancer Paternal Aunt     x 2  . Diabetes Brother   . Heart disease Mother     Allergies  Allergen Reactions  . Bupropion Hcl Other (See Comments)    Made her feel bad  . Ciprofloxacin Nausea And Vomiting    REACTION: hives  . Penicillins Rash    REACTION: rash    Current Outpatient Prescriptions on File Prior to Visit  Medication Sig Dispense Refill  . amLODipine (NORVASC) 5 MG tablet Take 1 tablet (5 mg total) by mouth 2 (two) times daily.  60 tablet  6  . fluocinonide-emollient (LIDEX-E) 0.05 % cream Apply topically 2 (two) times daily.  30 g  0  .  Multiple Vitamin (MULTIVITAMIN) tablet Take 1 tablet by mouth daily.      Marland Kitchen omeprazole (PRILOSEC) 40 MG capsule 1 caps bid x 4 weeks then reduce to 1 caps po qhs  69 capsule  6  . rOPINIRole (REQUIP) 1 MG tablet Take 1 tablet by mouth at bedtime.  30 tablet  6  . sertraline (ZOLOFT) 100 MG tablet take 1 and 1/2 tablets by mouth once daily  45 tablet  2   No current facility-administered medications on file prior to visit.    BP 144/90  Pulse 69  Temp(Src) 97.4 F (36.3 C) (Oral)  Resp 16  Ht 5' 7.01" (1.702 m)  Wt 189 lb (85.73 kg)  BMI 29.59 kg/m2  SpO2 97%       Objective:   Physical Exam  Constitutional: She is oriented to person, place, and time. She appears well-developed and well-nourished. No distress.  Cardiovascular: Normal rate and regular rhythm.   No murmur heard. Pulmonary/Chest: Effort normal and breath sounds normal. No respiratory distress. She has no wheezes. She has no rales. She exhibits no tenderness.  Abdominal: Soft. Bowel sounds are normal. She exhibits no distension.  There is no rebound and no guarding.  Mild epigastic tenderness. Mid tenderness near umbilicus.   Neurological: She is alert and oriented to person, place, and time.  Psychiatric: She has a normal mood and affect. Her behavior is normal. Judgment and thought content normal.          Assessment & Plan:

## 2012-11-11 NOTE — Patient Instructions (Addendum)
Please complete your lab work prior to leaving. You will be contacted about your CT scan and Echo.

## 2012-11-16 NOTE — Assessment & Plan Note (Addendum)
Pain is most likely related to gastritis, but will obtain CT abd/pelvis along with LFTs lipase. She is instructed to go to the ED if symptoms worsen or if she is unable to keep down food/liquid.  Resume prilosec.

## 2012-11-19 ENCOUNTER — Ambulatory Visit (HOSPITAL_BASED_OUTPATIENT_CLINIC_OR_DEPARTMENT_OTHER)
Admission: RE | Admit: 2012-11-19 | Discharge: 2012-11-19 | Disposition: A | Discharge status: Home / Self Care | Payer: Medicare Other | Source: Ambulatory Visit | Attending: Family | Admitting: Family

## 2012-11-19 DIAGNOSIS — R011 Cardiac murmur, unspecified: Secondary | ICD-10-CM | POA: Insufficient documentation

## 2012-11-19 DIAGNOSIS — I369 Nonrheumatic tricuspid valve disorder, unspecified: Secondary | ICD-10-CM | POA: Insufficient documentation

## 2012-11-19 DIAGNOSIS — I10 Essential (primary) hypertension: Secondary | ICD-10-CM | POA: Insufficient documentation

## 2012-11-19 NOTE — Progress Notes (Signed)
Echocardiogram 2D Echocardiogram has been performed.  Cassandra Miles 11/19/2012, 11:37 AM

## 2012-11-20 ENCOUNTER — Ambulatory Visit (HOSPITAL_BASED_OUTPATIENT_CLINIC_OR_DEPARTMENT_OTHER): Payer: Medicare Other

## 2012-11-20 ENCOUNTER — Other Ambulatory Visit: Payer: Self-pay | Admitting: Family Medicine

## 2012-11-20 ENCOUNTER — Ambulatory Visit (HOSPITAL_BASED_OUTPATIENT_CLINIC_OR_DEPARTMENT_OTHER)
Admission: RE | Admit: 2012-11-20 | Discharge: 2012-11-20 | Disposition: A | Discharge status: Home / Self Care | Payer: Medicare Other | Source: Ambulatory Visit | Attending: Family | Admitting: Family

## 2012-11-20 ENCOUNTER — Telehealth: Payer: Self-pay

## 2012-11-20 DIAGNOSIS — K5730 Diverticulosis of large intestine without perforation or abscess without bleeding: Secondary | ICD-10-CM | POA: Insufficient documentation

## 2012-11-20 DIAGNOSIS — R1013 Epigastric pain: Secondary | ICD-10-CM

## 2012-11-20 DIAGNOSIS — I10 Essential (primary) hypertension: Secondary | ICD-10-CM

## 2012-11-20 LAB — BASIC METABOLIC PANEL
BUN: 17 mg/dL (ref 6–23)
CO2: 26 mEq/L (ref 19–32)
Chloride: 104 mEq/L (ref 96–112)
Creat: 1.02 mg/dL (ref 0.50–1.10)

## 2012-11-20 LAB — LIPASE: Lipase: 26 U/L (ref 0–75)

## 2012-11-20 LAB — HEPATIC FUNCTION PANEL
ALT: 11 U/L (ref 0–35)
AST: 17 U/L (ref 0–37)
Indirect Bilirubin: 0.3 mg/dL (ref 0.0–0.9)
Total Protein: 6.3 g/dL (ref 6.0–8.3)

## 2012-11-20 MED ORDER — IOHEXOL 300 MG/ML  SOLN: 100.0000 mL | Freq: Once | INTRAMUSCULAR | Status: AC | PRN

## 2012-11-20 NOTE — Telephone Encounter (Signed)
BMET done today. Labs ordered

## 2012-11-21 ENCOUNTER — Telehealth: Payer: Self-pay | Admitting: Internal Medicine

## 2012-11-21 MED ORDER — AZITHROMYCIN 250 MG PO TABS: ORAL_TABLET | ORAL | Status: DC

## 2012-11-21 NOTE — Telephone Encounter (Signed)
CT abdomen looks good. There is question of developing pneumonia.  Is she having any cough, fever?  If so, I will send antibiotics,  If not, I would like her to contact me if any of these symptoms develop.

## 2012-11-21 NOTE — Telephone Encounter (Signed)
Patient would like her ct results

## 2012-11-21 NOTE — Telephone Encounter (Signed)
Notified pt. She states that she has had some cough and congestion but thought it might be allergies to her cat. Has not had any fever. Reports previous history of "walking pneumonia". Per verbal from Provider, ok to send zpak 250mg  #60 x no refills. Pt notified.

## 2012-11-21 NOTE — Telephone Encounter (Signed)
Please advise 

## 2012-12-04 ENCOUNTER — Telehealth: Payer: Self-pay

## 2012-12-04 DIAGNOSIS — Z79899 Other long term (current) drug therapy: Secondary | ICD-10-CM

## 2012-12-04 DIAGNOSIS — E7800 Pure hypercholesterolemia, unspecified: Secondary | ICD-10-CM

## 2012-12-04 LAB — HEPATIC FUNCTION PANEL
ALT: 10 U/L (ref 0–35)
AST: 18 U/L (ref 0–37)
Bilirubin, Direct: 0.1 mg/dL (ref 0.0–0.3)
Indirect Bilirubin: 0.3 mg/dL (ref 0.0–0.9)
Total Bilirubin: 0.4 mg/dL (ref 0.3–1.2)

## 2012-12-04 LAB — BASIC METABOLIC PANEL
BUN: 12 mg/dL (ref 6–23)
CO2: 28 mEq/L (ref 19–32)
Glucose, Bld: 95 mg/dL (ref 70–99)
Potassium: 4.1 mEq/L (ref 3.5–5.3)
Sodium: 141 mEq/L (ref 135–145)

## 2012-12-04 LAB — LIPID PANEL
Cholesterol: 226 mg/dL — ABNORMAL HIGH (ref 0–200)
VLDL: 20 mg/dL (ref 0–40)

## 2012-12-04 NOTE — Telephone Encounter (Signed)
Labs ordered per Dr Hodgin's last note

## 2012-12-09 ENCOUNTER — Ambulatory Visit: Payer: Medicare Other | Admitting: Family Medicine

## 2012-12-12 IMAGING — CR DG ELBOW COMPLETE 3+V*R*
4 series · 4 of 4 positions shown · non-contrast
Comparison: None.

CLINICAL DATA: Fall.  Pain.

RIGHT ELBOW - COMPLETE 3+ VIEW

[x elbow ap right]
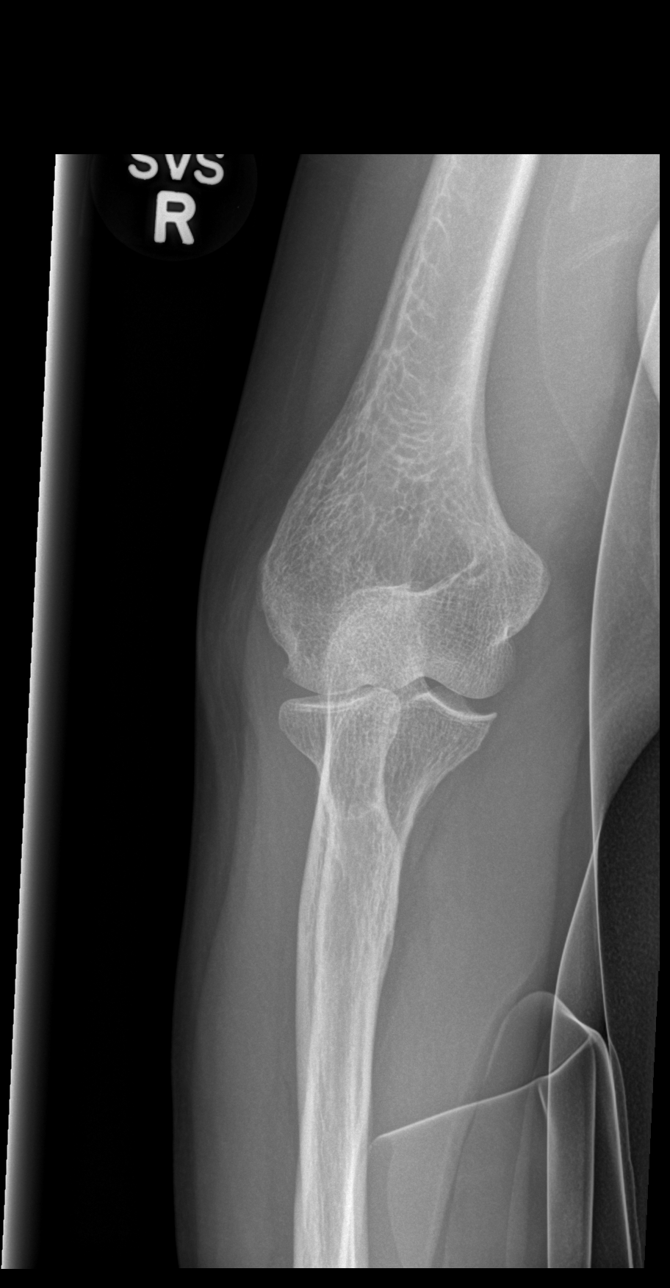

[x elbow obl right]
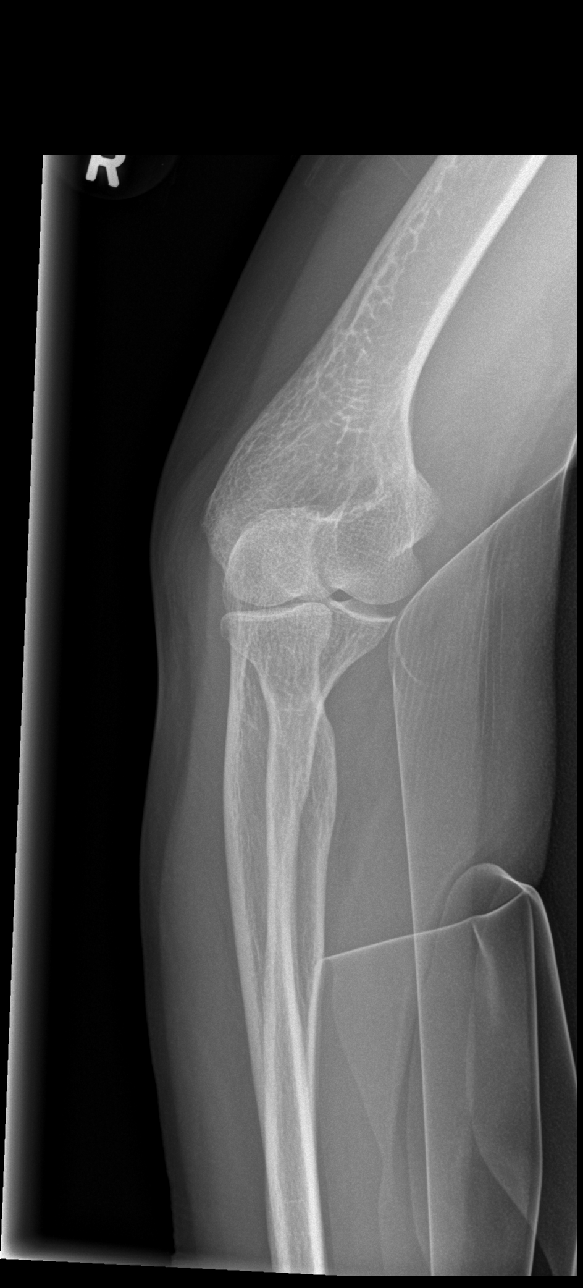

[x elbow lat right (1 of 2)]
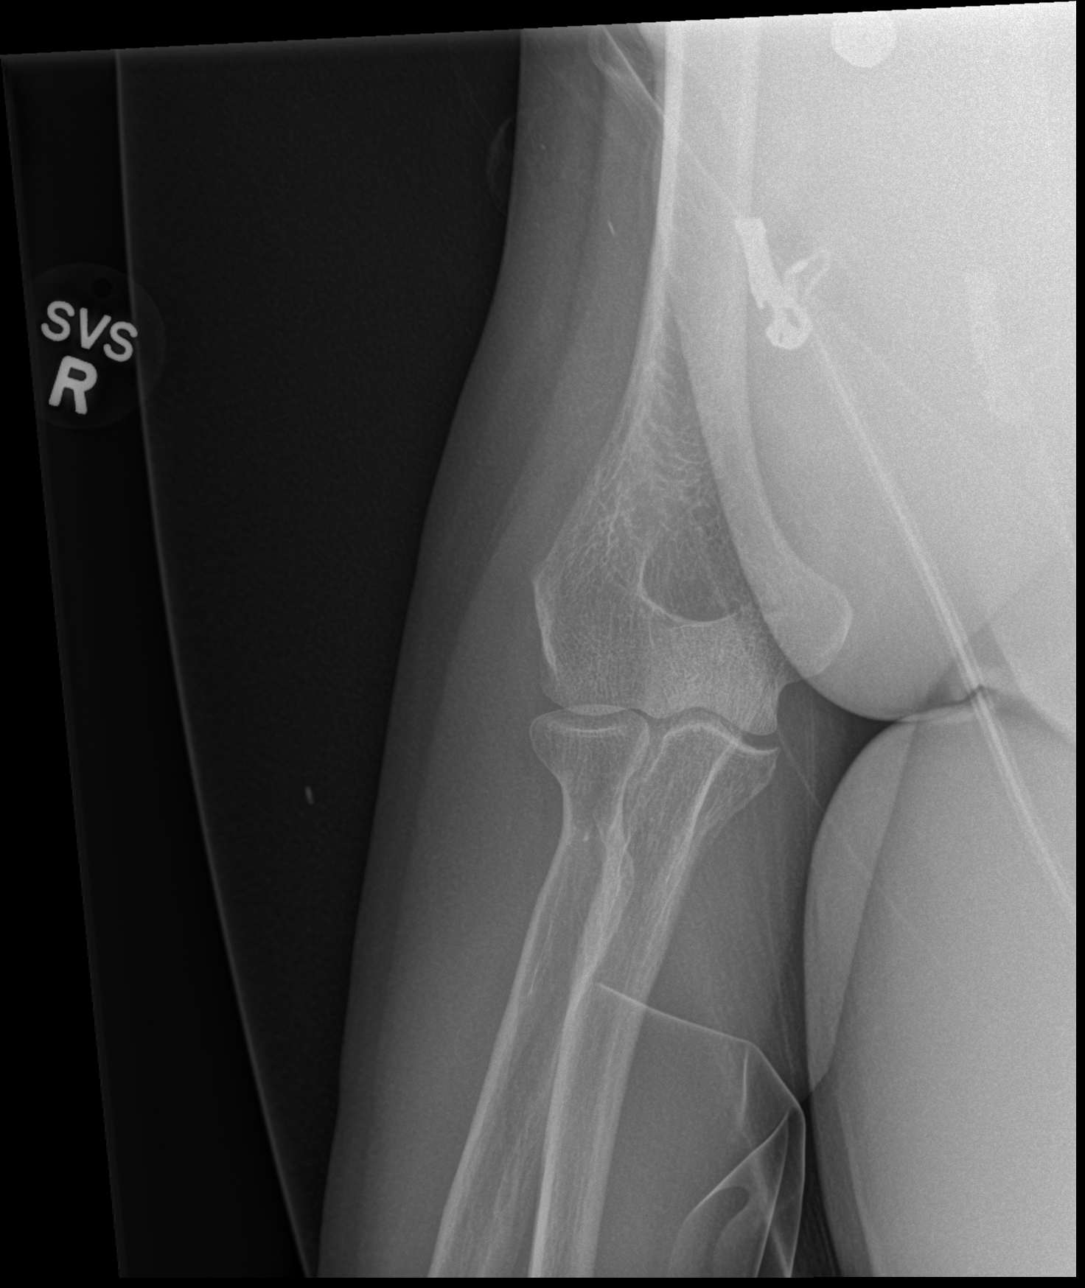

[x elbow lat right (2 of 2)]
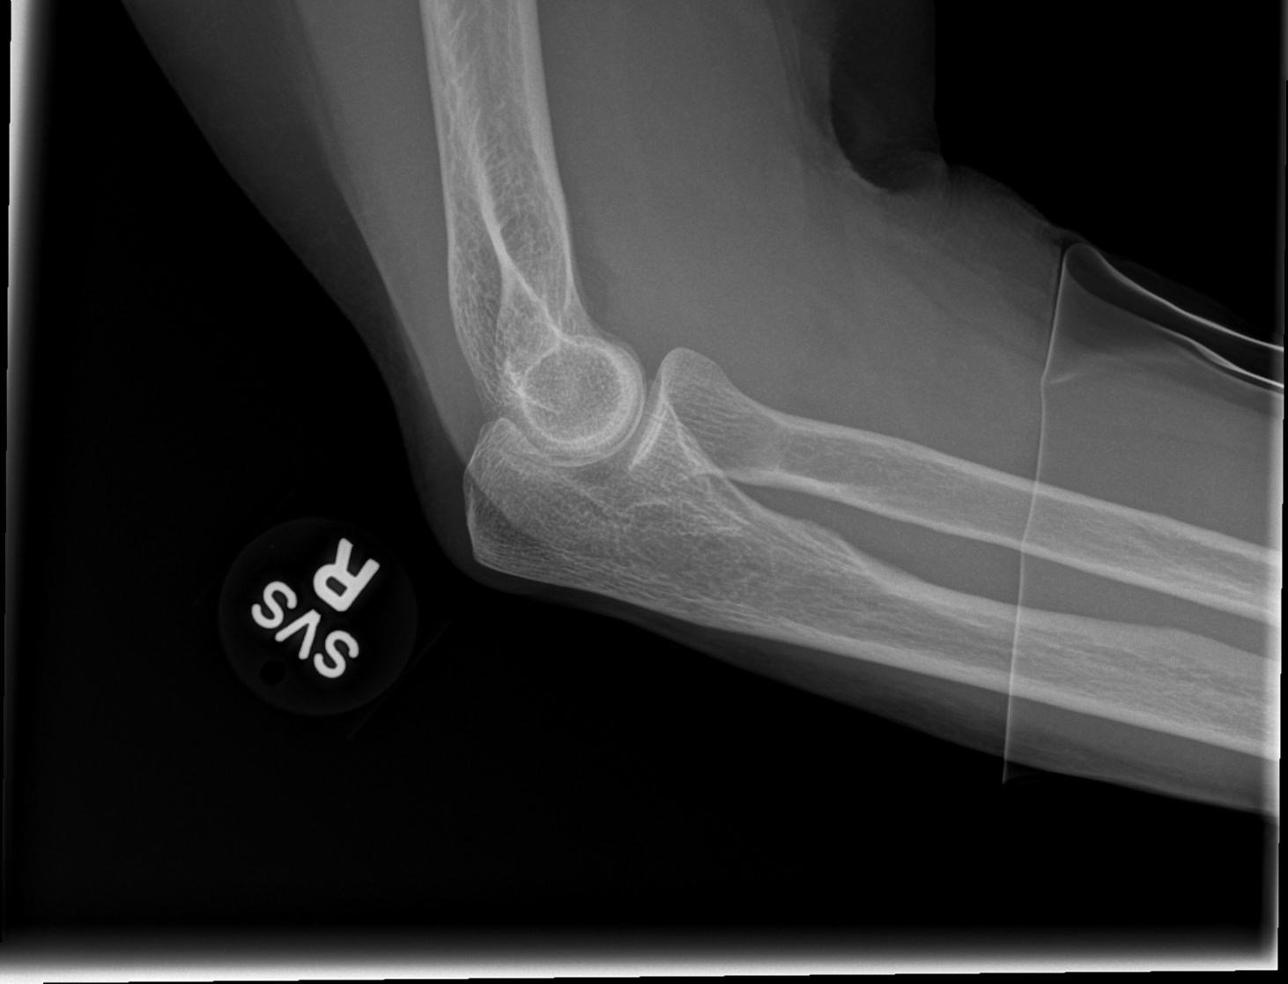

[4 of 4 positions shown; findings below may reference images not displayed]

FINDINGS: Four views study shows no fracture.  No subluxation or
dislocation.  No fat pad elevation to suggest joint effusion. Bones
are diffusely demineralized.
IMPRESSION: No acute findings.

## 2012-12-12 IMAGING — CR DG SHOULDER 2+V*R*
3 series · 3 of 3 positions shown · non-contrast
Comparison: None.

CLINICAL DATA: Fall.  Pain.

RIGHT SHOULDER - 2+ VIEW

[x shoulder ap right (1 of 3)]
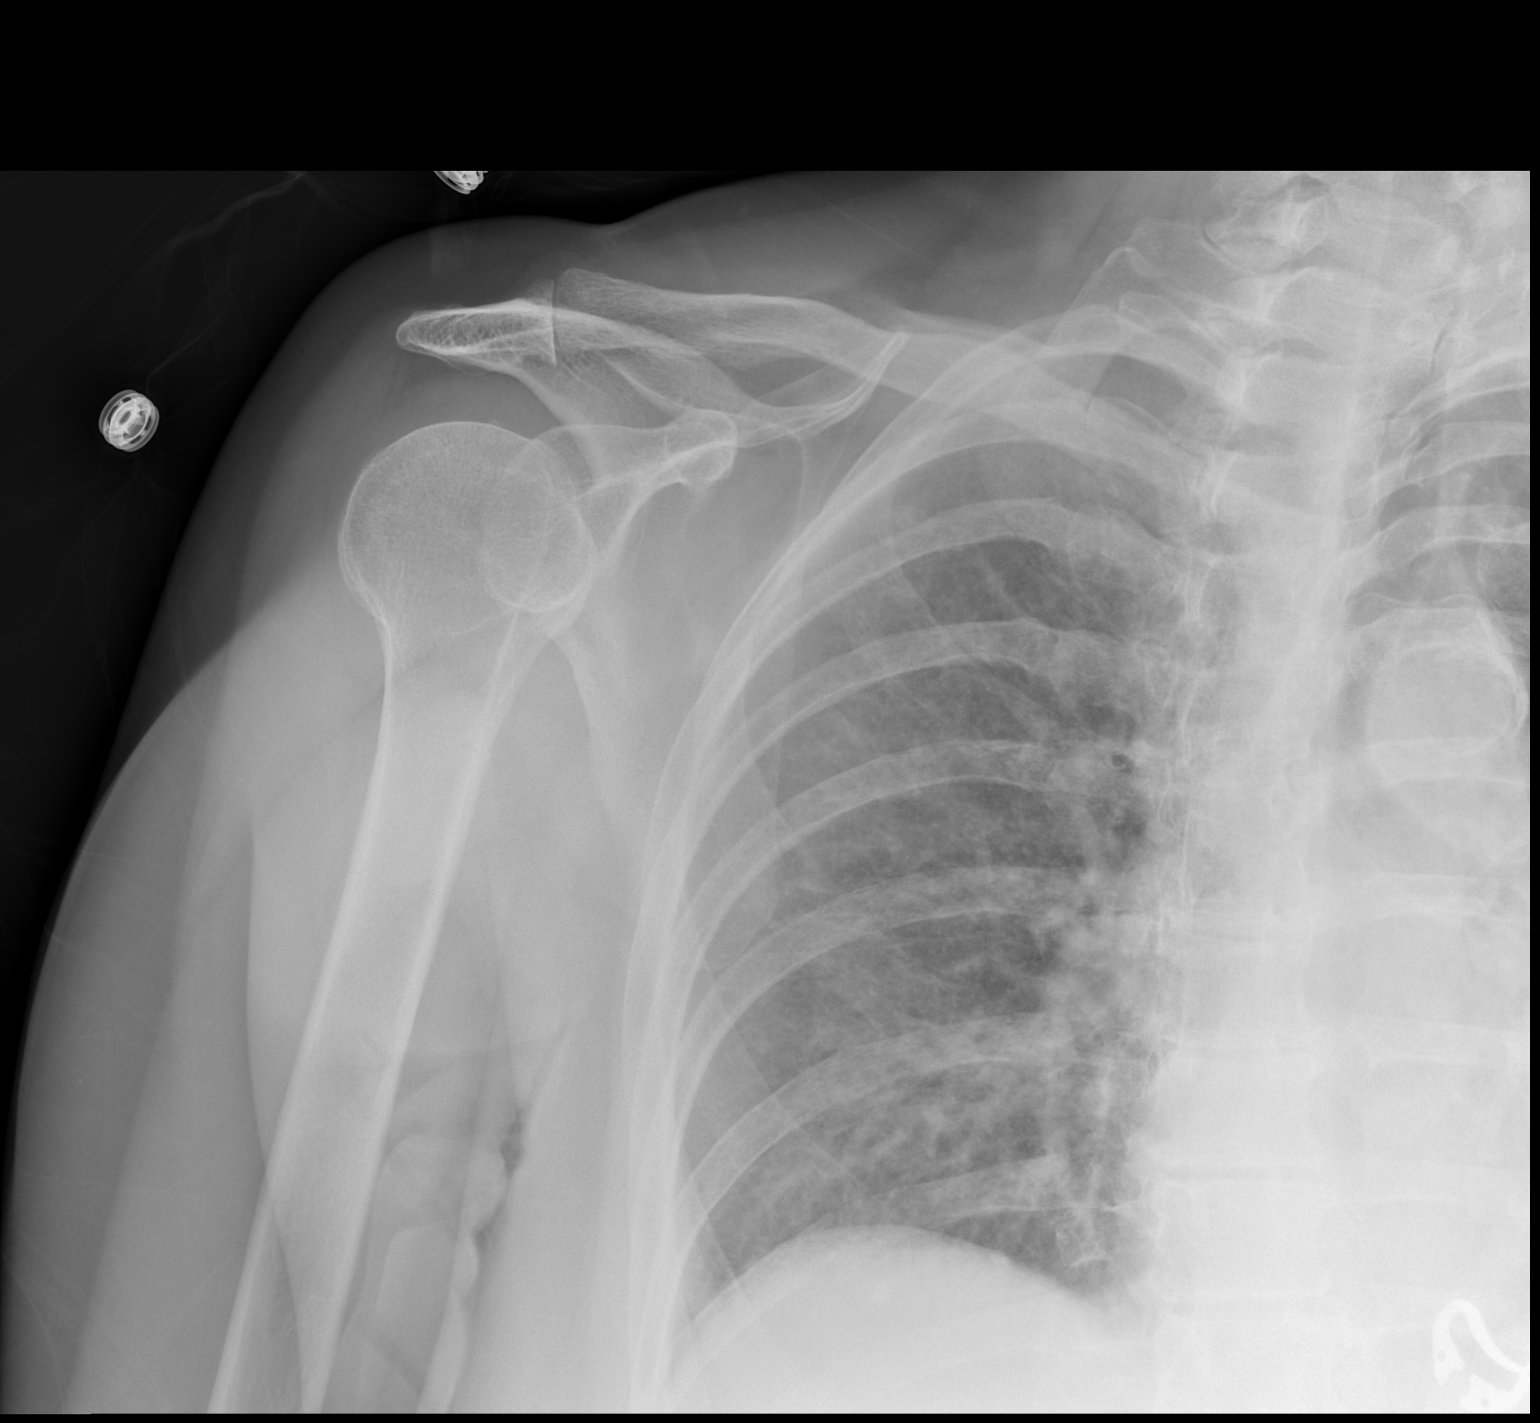

[x shoulder ap right (2 of 3)]
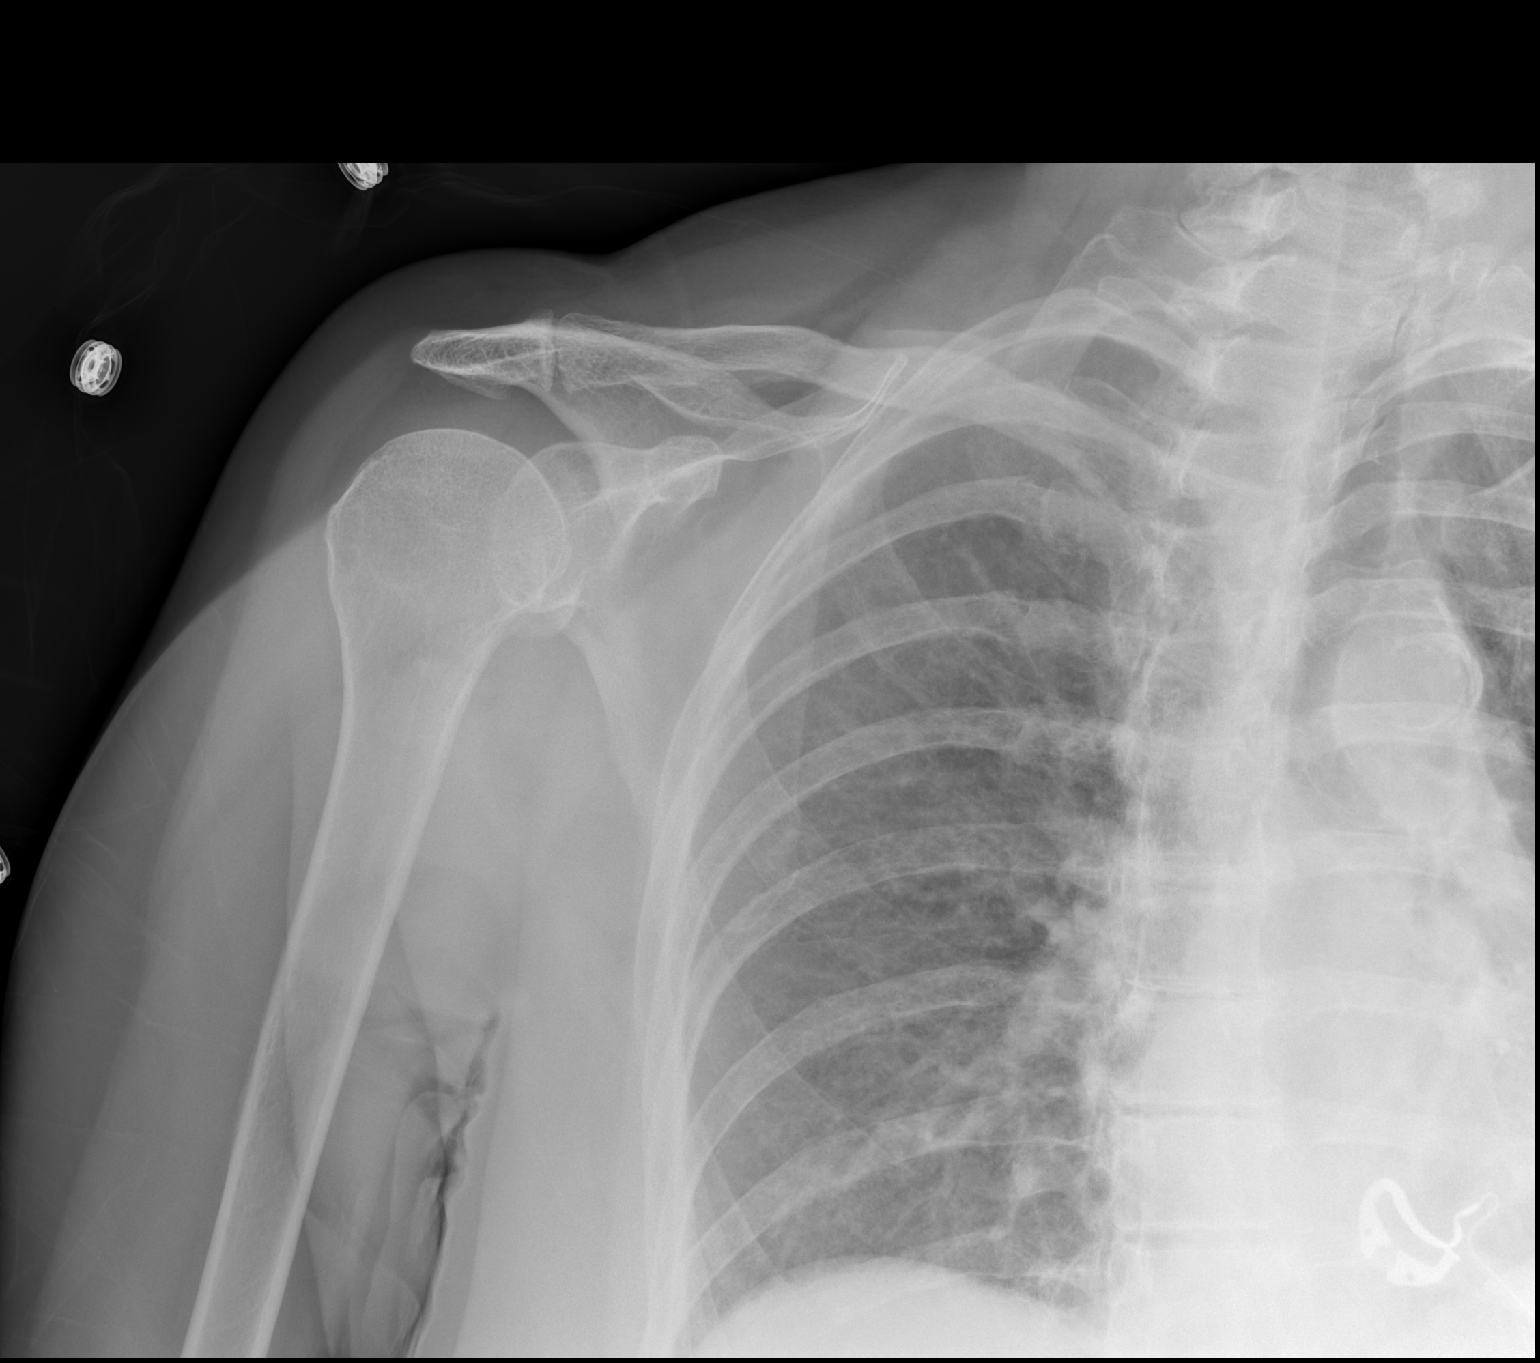

[x shoulder ap right (3 of 3)]
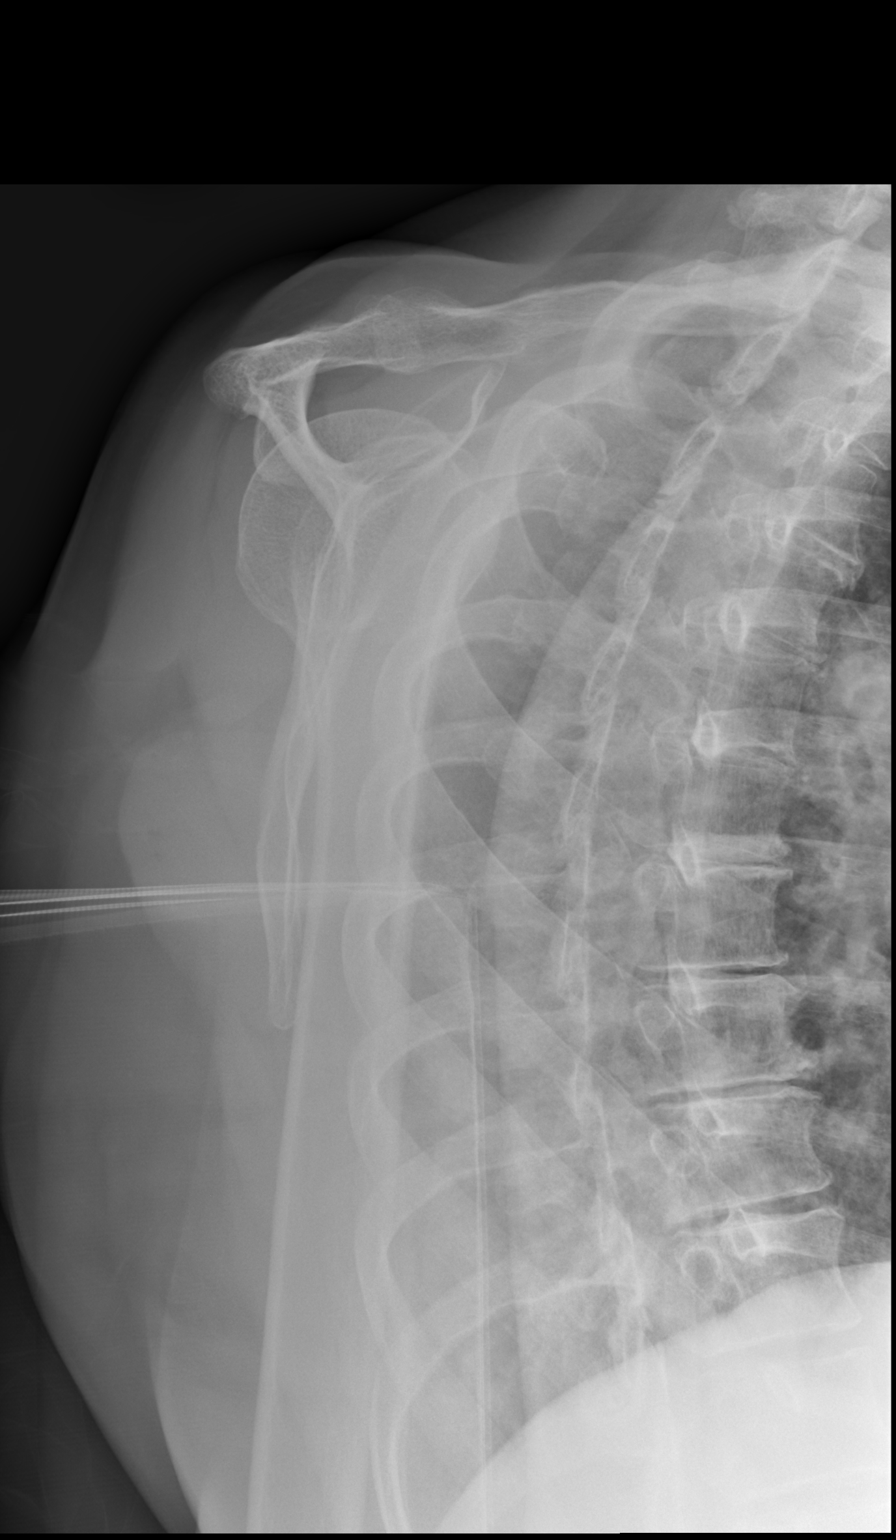

[3 of 3 positions shown; findings below may reference images not displayed]

FINDINGS: Are three-view exam shows no evidence for an acute
fracture.  No evidence for shoulder separation or dislocation.
Deformity of the right clavicle is stable since 08/23/2005 and
likely reflects old trauma.  No worrisome lytic or sclerotic
osseous abnormality.
IMPRESSION: No acute bony findings.

## 2012-12-16 ENCOUNTER — Other Ambulatory Visit: Payer: Self-pay | Admitting: Internal Medicine

## 2012-12-16 NOTE — Telephone Encounter (Signed)
Requip request [Last Rx 08.26.13 #30x6]/SLS Please advise.

## 2012-12-18 ENCOUNTER — Ambulatory Visit (INDEPENDENT_AMBULATORY_CARE_PROVIDER_SITE_OTHER): Payer: Medicare Other | Admitting: Family

## 2012-12-18 ENCOUNTER — Encounter: Payer: Self-pay | Admitting: Family

## 2012-12-18 VITALS — BP 146/80 | HR 68 | Temp 97.8°F | Resp 16 | Ht 67.0 in

## 2012-12-18 DIAGNOSIS — R2689 Other abnormalities of gait and mobility: Secondary | ICD-10-CM | POA: Insufficient documentation

## 2012-12-18 DIAGNOSIS — I10 Essential (primary) hypertension: Secondary | ICD-10-CM

## 2012-12-18 DIAGNOSIS — E7800 Pure hypercholesterolemia, unspecified: Secondary | ICD-10-CM

## 2012-12-18 DIAGNOSIS — F329 Major depressive disorder, single episode, unspecified: Secondary | ICD-10-CM

## 2012-12-18 DIAGNOSIS — R29818 Other symptoms and signs involving the nervous system: Secondary | ICD-10-CM

## 2012-12-18 DIAGNOSIS — R197 Diarrhea, unspecified: Secondary | ICD-10-CM

## 2012-12-18 DIAGNOSIS — M545 Low back pain: Secondary | ICD-10-CM

## 2012-12-18 DIAGNOSIS — G4733 Obstructive sleep apnea (adult) (pediatric): Secondary | ICD-10-CM

## 2012-12-18 MED ORDER — MELOXICAM 7.5 MG PO TABS: 7.5000 mg | ORAL_TABLET | Freq: Every day | ORAL | Status: DC

## 2012-12-18 MED ORDER — ATORVASTATIN CALCIUM 40 MG PO TABS: 40.0000 mg | ORAL_TABLET | Freq: Every day | ORAL | Status: DC

## 2012-12-18 NOTE — Assessment & Plan Note (Signed)
Noncompliant with CPAP 

## 2012-12-18 NOTE — Progress Notes (Signed)
Subjective:    Patient ID: Cassandra Miles, female    DOB: 08/22/1941, 72 y.o.   MRN: [id number]  HPI  Cassandra Miles is a 72 yr old female who presents today for follow up.  1) HTN- currently on amlodipine and lisinopril.  Reports mild cough.    2) Hyperlipidemia-currently on pravastatin.  She denies myalgia.    3) Depression- currently on sertraline. Reports that depression is well controlled.   4) Diarrhea- ?celiac dz in mother. Aunt had celiac disease.   Pt reports "tremendous diarrhea with occasional fecal incontinence as a result.    5) She reports some shooting pains down the left anterior leg.  She also reports some dry scaling skin on the left leg.  Feels like her balance is poor.    6) GERD- Continues PPI.  Complains of GERD symptoms.    7) OSA- does not tolerate CPAP.        Review of Systems See HPI  Past Medical History  Diagnosis Date  . Restless leg syndrome   . Hypertension   . A-fib 2008    brief episode  . Palpitations   . Sleep apnea   . Depression   . Peptic ulcer disease   . GERD (gastroesophageal reflux disease)   . Barrett esophagus   . Avascular necrosis   . Osteoarthritis   . Hepatic cyst   . Hyperlipidemia   . Blood transfusion without reported diagnosis     WITH  HIP REPLACEMENT 06,07  . Cataract     History   Social History  . Marital Status: Divorced    Spouse Name: N/A    Number of Children: N/A  . Years of Education: N/A   Occupational History  . retired     Runner, broadcasting/film/video   Social History Main Topics  . Smoking status: Never Smoker   . Smokeless tobacco: Never Used  . Alcohol Use: Yes     Comment: SOCIAL  . Drug Use: No  . Sexually Active: Not on file   Other Topics Concern  . Not on file   Social History Narrative   Moved from [place], [place]    Past Surgical History  Procedure Laterality Date  . Hip fracture surgery      Bilateral hip replacements  . Orif wrist fracture  09/14/2011    Procedure: OPEN REDUCTION  INTERNAL FIXATION (ORIF) WRIST FRACTURE;  Surgeon: Oletta Cohn III;  Location: [facility];  Service: Orthopedics;  Laterality: N/A;  . Cholecystectomy    . Tubal ligation    . Decompression core hip      bilateral hips  . Breast reduction surgery    . Colonoscopy      Family History  Problem Relation Age of Onset  . Arthritis Father   . Stroke Mother     x2  . Heart disease Brother     Stenting   . Hyperlipidemia Brother   . Melanoma Brother   . Colon cancer Paternal Aunt     x 2  . Diabetes Brother   . Heart disease Mother     Allergies  Allergen Reactions  . Bupropion Hcl Other (See Comments)    Made her feel bad  . Ciprofloxacin Nausea And Vomiting    REACTION: hives  . Penicillins Rash    REACTION: rash    Current Outpatient Prescriptions on File Prior to Visit  Medication Sig Dispense Refill  . amLODipine (NORVASC) 5 MG tablet Take 1 tablet (5 mg total)  by mouth 2 (two) times daily.  60 tablet  6  . fluocinonide-emollient (LIDEX-E) 0.05 % cream Apply topically 2 (two) times daily.  30 g  0  . lisinopril (PRINIVIL,ZESTRIL) 20 MG tablet Take 1 tablet (20 mg total) by mouth daily.  30 tablet  6  . Multiple Vitamin (MULTIVITAMIN) tablet Take 1 tablet by mouth daily.      Marland Kitchen omeprazole (PRILOSEC) 40 MG capsule 1 caps bid x 4 weeks then reduce to 1 caps po qhs  69 capsule  6  . rOPINIRole (REQUIP) 1 MG tablet take 1 tablet by mouth at bedtime  30 tablet  2  . sertraline (ZOLOFT) 100 MG tablet take 1 and 1/2 tablets by mouth once daily  45 tablet  2   No current facility-administered medications on file prior to visit.    BP 146/80  Pulse 68  Temp(Src) 97.8 F (36.6 C) (Oral)  Resp 16  Ht 5\' 7"  (1.702 m)  SpO2 98%       Objective:   Physical Exam  Constitutional: She is oriented to person, place, and time. She appears well-developed and well-nourished. No distress.  HENT:  Head: Normocephalic and atraumatic.  Cardiovascular: Normal rate and regular rhythm.    No murmur heard. Pulmonary/Chest: Effort normal and breath sounds normal. No respiratory distress. She has no wheezes. She has no rales. She exhibits no tenderness.  Musculoskeletal: She exhibits no edema.  Neurological: She is alert and oriented to person, place, and time.  Reflex Scores:      Patellar reflexes are 2+ on the right side and 2+ on the left side. Bilateral LE strength is 5/5  Skin: Skin is warm and dry.  Psychiatric: She has a normal mood and affect. Her behavior is normal. Judgment and thought content normal.          Assessment & Plan:

## 2012-12-18 NOTE — Assessment & Plan Note (Signed)
This is well controlled on sertraline, continue same.

## 2012-12-18 NOTE — Assessment & Plan Note (Signed)
Will refer to PT for therapy and balance training.

## 2012-12-18 NOTE — Assessment & Plan Note (Signed)
No significant improvement despite addition of pravastatin 40mg .  Will change to atorvastatin 40mg .  Plan flp/lft next visit.

## 2012-12-18 NOTE — Assessment & Plan Note (Signed)
BP Readings from Last 3 Encounters:  12/18/12 146/80  11/11/12 144/90  10/09/12 128/92   Blood pressure, mildly elevated.  Continue current meds for now.  If BP remains elevated, consider addition of hctz.

## 2012-12-18 NOTE — Assessment & Plan Note (Signed)
She is requesting celiac panel.

## 2012-12-18 NOTE — Patient Instructions (Addendum)
Please complete blood work prior to leaving. You will be contact about your referral to physical therapy. Please let us know if you have not heard back within 1 week about your referral. Follow up in 2 months- come fasting to this appointment.

## 2012-12-18 NOTE — Assessment & Plan Note (Signed)
Plan short course of NSAIDS.

## 2012-12-19 LAB — RETICULIN ANTIBODIES, IGA W TITER: Reticulin Ab, IgA: NEGATIVE

## 2012-12-19 LAB — GLIADIN ANTIBODIES, SERUM: Gliadin IgG: 40.2 U/mL — ABNORMAL HIGH (ref <20)

## 2012-12-23 ENCOUNTER — Ambulatory Visit: Payer: Medicare Other | Attending: Family | Admitting: Physical Therapy

## 2012-12-23 ENCOUNTER — Encounter: Payer: Self-pay | Admitting: Internal Medicine

## 2012-12-23 DIAGNOSIS — M25559 Pain in unspecified hip: Secondary | ICD-10-CM | POA: Insufficient documentation

## 2012-12-23 DIAGNOSIS — Z96649 Presence of unspecified artificial hip joint: Secondary | ICD-10-CM | POA: Insufficient documentation

## 2012-12-23 DIAGNOSIS — M6281 Muscle weakness (generalized): Secondary | ICD-10-CM | POA: Insufficient documentation

## 2012-12-23 DIAGNOSIS — IMO0001 Reserved for inherently not codable concepts without codable children: Secondary | ICD-10-CM | POA: Insufficient documentation

## 2012-12-23 DIAGNOSIS — Z9181 History of falling: Secondary | ICD-10-CM | POA: Insufficient documentation

## 2012-12-23 DIAGNOSIS — M25659 Stiffness of unspecified hip, not elsewhere classified: Secondary | ICD-10-CM | POA: Insufficient documentation

## 2012-12-24 ENCOUNTER — Telehealth: Payer: Self-pay | Admitting: Family

## 2012-12-24 DIAGNOSIS — R894 Abnormal immunological findings in specimens from other organs, systems and tissues: Secondary | ICD-10-CM

## 2012-12-24 NOTE — Telephone Encounter (Signed)
Notified pt and she is agreeable to proceed with referral. 

## 2012-12-24 NOTE — Telephone Encounter (Signed)
Please advise mychart question? 

## 2012-12-24 NOTE — Telephone Encounter (Signed)
Pls call pt and let her know that I reviewed her celiac panel.  One of the antibodies is positive. This does not confirm celiac, disease, but I would like to refer her to GI for further testing.

## 2012-12-25 ENCOUNTER — Ambulatory Visit: Payer: Medicare Other | Admitting: Physical Therapy

## 2012-12-29 ENCOUNTER — Ambulatory Visit: Payer: Medicare Other | Admitting: Physical Therapy

## 2012-12-30 ENCOUNTER — Ambulatory Visit: Payer: Medicare Other | Admitting: Family Medicine

## 2013-01-01 ENCOUNTER — Ambulatory Visit: Payer: Medicare Other | Admitting: Physical Therapy

## 2013-01-19 ENCOUNTER — Ambulatory Visit: Payer: Medicare Other | Admitting: Physical Therapy

## 2013-01-20 ENCOUNTER — Ambulatory Visit: Payer: Medicare Other | Admitting: Physical Therapy

## 2013-01-29 ENCOUNTER — Ambulatory Visit: Payer: Medicare Other | Attending: Family | Admitting: Physical Therapy

## 2013-01-29 DIAGNOSIS — Z9181 History of falling: Secondary | ICD-10-CM | POA: Insufficient documentation

## 2013-01-29 DIAGNOSIS — M25559 Pain in unspecified hip: Secondary | ICD-10-CM | POA: Insufficient documentation

## 2013-01-29 DIAGNOSIS — M6281 Muscle weakness (generalized): Secondary | ICD-10-CM | POA: Insufficient documentation

## 2013-01-29 DIAGNOSIS — Z96649 Presence of unspecified artificial hip joint: Secondary | ICD-10-CM | POA: Insufficient documentation

## 2013-01-29 DIAGNOSIS — IMO0001 Reserved for inherently not codable concepts without codable children: Secondary | ICD-10-CM | POA: Insufficient documentation

## 2013-01-29 DIAGNOSIS — M25659 Stiffness of unspecified hip, not elsewhere classified: Secondary | ICD-10-CM | POA: Insufficient documentation

## 2013-02-02 ENCOUNTER — Ambulatory Visit: Payer: Medicare Other | Admitting: Physical Therapy

## 2013-02-11 ENCOUNTER — Ambulatory Visit: Payer: Medicare Other | Admitting: Physical Therapy

## 2013-02-12 ENCOUNTER — Ambulatory Visit: Payer: Medicare Other | Admitting: Physical Therapy

## 2013-02-17 ENCOUNTER — Encounter: Payer: Self-pay | Admitting: Internal Medicine

## 2013-02-17 ENCOUNTER — Ambulatory Visit (INDEPENDENT_AMBULATORY_CARE_PROVIDER_SITE_OTHER): Payer: Medicare Other | Admitting: Internal Medicine

## 2013-02-17 VITALS — BP 128/76 | HR 68 | Ht 67.0 in | Wt 195.0 lb

## 2013-02-17 DIAGNOSIS — K219 Gastro-esophageal reflux disease without esophagitis: Secondary | ICD-10-CM

## 2013-02-17 DIAGNOSIS — R894 Abnormal immunological findings in specimens from other organs, systems and tissues: Secondary | ICD-10-CM

## 2013-02-17 NOTE — Progress Notes (Signed)
Cassandra Miles 10-Jul-1941 MRN [id number]        History of Present Illness:  This is a 72 year old white female who comes for evaluation of celiac disease. Her antigliadin IgG was elevated to 40.2, normal being less than 20. Her gliadin   IgA was normal at 8.8. Her tissue transglutaminase IgA was normal at 5.9. There is a positive family history of celiac disease in maternal aunt.. We have been following her for Barrett's esophagus and esophageal stricture which was just recently dilated in January 2014 to 16 mm Savary dilators. She had grade 1 esophagitis and 3 cm hiatal hernia. There is a history of Barrett's esophagus on endoscopy in 2009 and 2010. She has occasional diarrhea and symptoms of urgency. Last colonoscopy was in 2009. She has not tried gluten-free diet.   Past Medical History  Diagnosis Date  . Restless leg syndrome   . Hypertension   . A-fib 2008    brief episode  . Palpitations   . Sleep apnea   . Depression   . Peptic ulcer disease   . GERD (gastroesophageal reflux disease)   . Barrett esophagus   . Avascular necrosis   . Osteoarthritis     knee  . Hepatic cyst   . Hyperlipidemia   . Blood transfusion without reported diagnosis     WITH  HIP REPLACEMENT 06,07  . Cataract   . Spinal stenosis    Past Surgical History  Procedure Laterality Date  . Hip fracture surgery      Bilateral hip replacements  . Orif wrist fracture  09/14/2011    Procedure: OPEN REDUCTION INTERNAL FIXATION (ORIF) WRIST FRACTURE;  Surgeon: Oletta Cohn III;  Location: [facility];  Service: Orthopedics;  Laterality: N/A;  . Cholecystectomy    . Tubal ligation    . Decompression core hip      bilateral hips  . Breast reduction surgery    . Colonoscopy      reports that she has never smoked. She has never used smokeless tobacco. She reports that  drinks alcohol. She reports that she does not use illicit drugs. family history includes Arthritis in her father; Colon cancer in her  paternal aunt; Diabetes in her brother; Heart disease in her brother and mother; Hyperlipidemia in her brother; Melanoma in her brother; and Stroke in her mother. Allergies  Allergen Reactions  . Bupropion Hcl Other (See Comments)    Made her feel bad  . Ciprofloxacin Nausea And Vomiting    REACTION: hives  . Penicillins Rash    REACTION: rash        Review of Systems: No dysphagia since last dilatation  The remainder of the 10 point ROS is negative except as outlined in H&P   Physical Exam: General appearance  Well developed, in no distress. Psychological normal mood and affect.  Assessment and Plan:  72 year old white female with the abnormal IgG antigliadin which is less specific than IgA for diagnosis of sprue..We will  obtain small bowel biopsies.to r/o villous atrophy. There is a family history of celiac disease in an indirect relatives which makes her higher risk for gluten enteropathy. She is interested in pursuing this, so, we will schedule her for upper endoscopy with small bowel biopsies. She will continue  anti reflux regimen   02/17/2013 Lina Sar

## 2013-02-17 NOTE — Patient Instructions (Addendum)
You have been scheduled for an endoscopy.  Please follow the written instructions given to you at your visit today. Please pick up your prep at the pharmacy within the next 1-3 days. If you use inhalers (even only as needed), please bring them with you on the day of your procedure. Your physician has requested that you go to www.[email or web address] and enter the access code given to you at your visit today. This web site gives a general overview about your procedure. However, you should still follow specific instructions given to you by our office regarding your preparation for the procedure  Dr Daryel Gerald

## 2013-02-18 ENCOUNTER — Encounter: Payer: Self-pay | Admitting: Internal Medicine

## 2013-02-18 ENCOUNTER — Ambulatory Visit (AMBULATORY_SURGERY_CENTER): Payer: Medicare Other | Admitting: Internal Medicine

## 2013-02-18 VITALS — BP 127/66 | HR 66 | Temp 97.7°F | Resp 33 | Ht 67.0 in | Wt 195.0 lb

## 2013-02-18 DIAGNOSIS — K219 Gastro-esophageal reflux disease without esophagitis: Secondary | ICD-10-CM

## 2013-02-18 DIAGNOSIS — D1330 Benign neoplasm of unspecified part of small intestine: Secondary | ICD-10-CM

## 2013-02-18 DIAGNOSIS — R894 Abnormal immunological findings in specimens from other organs, systems and tissues: Secondary | ICD-10-CM

## 2013-02-18 MED ORDER — SODIUM CHLORIDE 0.9 % IV SOLN: 500.0000 mL | INTRAVENOUS | Status: DC

## 2013-02-18 NOTE — Progress Notes (Signed)
Patient did not experience any of the following events: a burn prior to discharge; a fall within the facility; wrong site/side/patient/procedure/implant event; or a hospital transfer or hospital admission upon discharge from the facility. (G8907) Patient did not have preoperative order for IV antibiotic SSI prophylaxis. (G8918)  

## 2013-02-18 NOTE — Op Note (Signed)
 [facility] [address] [place], [address]   ENDOSCOPY PROCEDURE REPORT  PATIENT: Cassandra Miles, Cassandra Miles  MR#: [id number] BIRTHDATE: 1941/05/03 , 72  yrs. old GENDER: Female ENDOSCOPIST: Hart Carwin, MD REFERRED BY:  Charlynn Court, M.D. PROCEDURE DATE:  02/18/2013 PROCEDURE:  EGD w/ biopsy ASA CLASS:     Class II INDICATIONS:  elevated antigliadin antibody IgG to 40.2 ( nl < 20), symptomps of bloating, rest of  the celiac panel is normal MEDICATIONS: MAC sedation, administered by CRNA and propofol (Diprivan) 150mg  IV TOPICAL ANESTHETIC: none  DESCRIPTION OF PROCEDURE: After the risks benefits and alternatives of the procedure were thoroughly explained, informed consent was obtained.  The LB [id number] endoscope was introduced through the mouth and advanced to the second portion of the duodenum. Without limitations.  The instrument was slowly withdrawn as the mucosa was fully examined.        ESOPHAGUS: The mucosa of the esophagus appeared normal.  STOMACH: A 4 cm hiatal hernia was noted.  DUODENUM: The duodenal mucosa showed no abnormalities in the bulb and second portion of the duodenum.  Cold forcep biopsies were taken in the second portion.  Retroflexed views revealed no abnormalities.     The scope was then withdrawn from the patient and the procedure completed.  COMPLICATIONS: There were no complications. ENDOSCOPIC IMPRESSION: 1.   The mucosa of the esophagus appeared normal 2.   4 cm hiatal hernia 3.   The duodenal mucosa showed no abnormalities in the bulb and second portion of the duodenum , biopsies taken to r/o villous atrophy  RECOMMENDATIONS: Await pathology results  REPEAT EXAM: for EGD pending biopsy results.  eSigned:  Hart Carwin, MD 02/18/2013 3:46 PM   CC:

## 2013-02-18 NOTE — Progress Notes (Signed)
Called to room to assist during endoscopic procedure.  Patient ID and intended procedure confirmed with present staff. Received instructions for my participation in the procedure from the performing physician.  

## 2013-02-18 NOTE — Patient Instructions (Addendum)
Discharge instructions given with verbal understanding. Biopsies taken,and a handout on hiatal hernia given. Resume previous medications. YOU HAD AN ENDOSCOPIC PROCEDURE TODAY AT THE [facility]: Refer to the procedure report that was given to you for any specific questions about what was found during the examination.  If the procedure report does not answer your questions, please call your gastroenterologist to clarify.  If you requested that your care partner not be given the details of your procedure findings, then the procedure report has been included in a sealed envelope for you to review at your convenience later.  YOU SHOULD EXPECT: Some feelings of bloating in the abdomen. Passage of more gas than usual.  Walking can help get rid of the air that was put into your GI tract during the procedure and reduce the bloating. If you had a lower endoscopy (such as a colonoscopy or flexible sigmoidoscopy) you may notice spotting of blood in your stool or on the toilet paper. If you underwent a bowel prep for your procedure, then you may not have a normal bowel movement for a few days.  DIET: Your first meal following the procedure should be a light meal and then it is ok to progress to your normal diet.  A half-sandwich or bowl of soup is an example of a good first meal.  Heavy or fried foods are harder to digest and may make you feel nauseous or bloated.  Likewise meals heavy in dairy and vegetables can cause extra gas to form and this can also increase the bloating.  Drink plenty of fluids but you should avoid alcoholic beverages for 24 hours.  ACTIVITY: Your care partner should take you home directly after the procedure.  You should plan to take it easy, moving slowly for the rest of the day.  You can resume normal activity the day after the procedure however you should NOT DRIVE or use heavy machinery for 24 hours (because of the sedation medicines used during the test).    SYMPTOMS TO  REPORT IMMEDIATELY: A gastroenterologist can be reached at any hour.  During normal business hours, 8:30 AM to 5:00 PM Monday through Friday, call [phone].  After hours and on weekends, please call the GI answering service at [phone] who will take a message and have the physician on call contact you.   Following upper endoscopy (EGD)  Vomiting of blood or coffee ground material  New chest pain or pain under the shoulder blades  Painful or persistently difficult swallowing  New shortness of breath  Fever of 100F or higher  Black, tarry-looking stools  FOLLOW UP: If any biopsies were taken you will be contacted by phone or by letter within the next 1-3 weeks.  Call your gastroenterologist if you have not heard about the biopsies in 3 weeks.  Our staff will call the home number listed on your records the next business day following your procedure to check on you and address any questions or concerns that you may have at that time regarding the information given to you following your procedure. This is a courtesy call and so if there is no answer at the home number and we have not heard from you through the emergency physician on call, we will assume that you have returned to your regular daily activities without incident.  SIGNATURES/CONFIDENTIALITY: You and/or your care partner have signed paperwork which will be entered into your electronic medical record.  These signatures attest to the fact that that  the information above on your After Visit Summary has been reviewed and is understood.  Full responsibility of the confidentiality of this discharge information lies with you and/or your care-partner.

## 2013-02-19 ENCOUNTER — Telehealth: Payer: Self-pay | Admitting: *Deleted

## 2013-02-19 NOTE — Telephone Encounter (Signed)
  Follow up Call-  Call back number 02/18/2013 10/07/2012  Post procedure Call Back phone  # [phone] OR [phone] NOT SURE IF 282 OR 285  Permission to leave phone message Yes Yes     Patient questions:  Do you have a fever, pain , or abdominal swelling? no Pain Score  0 *  Have you tolerated food without any problems? yes  Have you been able to return to your normal activities? yes  Do you have any questions about your discharge instructions: Diet   no Medications  no Follow up visit  no  Do you have questions or concerns about your Care? no  Actions: * If pain score is 4 or above: No action needed, pain <4.

## 2013-02-23 ENCOUNTER — Encounter: Payer: Self-pay | Admitting: Internal Medicine

## 2013-02-23 ENCOUNTER — Ambulatory Visit: Payer: Medicare Other | Admitting: Family

## 2013-02-25 ENCOUNTER — Encounter: Payer: Self-pay | Admitting: Family

## 2013-02-25 ENCOUNTER — Telehealth: Payer: Self-pay | Admitting: Family

## 2013-02-25 ENCOUNTER — Ambulatory Visit (INDEPENDENT_AMBULATORY_CARE_PROVIDER_SITE_OTHER): Payer: Medicare Other | Admitting: Family

## 2013-02-25 ENCOUNTER — Other Ambulatory Visit (HOSPITAL_COMMUNITY)
Admission: RE | Admit: 2013-02-25 | Discharge: 2013-02-25 | Disposition: A | Discharge status: Home / Self Care | Payer: Medicare Other | Source: Ambulatory Visit | Attending: Family | Admitting: Family

## 2013-02-25 VITALS — BP 120/88 | HR 66 | Temp 97.8°F | Resp 16 | Ht 67.0 in | Wt 190.1 lb

## 2013-02-25 DIAGNOSIS — M545 Low back pain: Secondary | ICD-10-CM

## 2013-02-25 DIAGNOSIS — N898 Other specified noninflammatory disorders of vagina: Secondary | ICD-10-CM | POA: Insufficient documentation

## 2013-02-25 DIAGNOSIS — E7800 Pure hypercholesterolemia, unspecified: Secondary | ICD-10-CM

## 2013-02-25 DIAGNOSIS — Z124 Encounter for screening for malignant neoplasm of cervix: Secondary | ICD-10-CM | POA: Insufficient documentation

## 2013-02-25 DIAGNOSIS — E785 Hyperlipidemia, unspecified: Secondary | ICD-10-CM

## 2013-02-25 DIAGNOSIS — R197 Diarrhea, unspecified: Secondary | ICD-10-CM

## 2013-02-25 DIAGNOSIS — N390 Urinary tract infection, site not specified: Secondary | ICD-10-CM

## 2013-02-25 DIAGNOSIS — R319 Hematuria, unspecified: Secondary | ICD-10-CM

## 2013-02-25 DIAGNOSIS — Z01419 Encounter for gynecological examination (general) (routine) without abnormal findings: Secondary | ICD-10-CM

## 2013-02-25 LAB — POCT URINALYSIS DIPSTICK
Ketones, UA: NEGATIVE
Nitrite, UA: NEGATIVE
Urobilinogen, UA: 0.2
pH, UA: 8

## 2013-02-25 LAB — HEPATIC FUNCTION PANEL
ALT: 21 U/L (ref 0–35)
Albumin: 4.1 g/dL (ref 3.5–5.2)
Bilirubin, Direct: 0.1 mg/dL (ref 0.0–0.3)

## 2013-02-25 LAB — LIPID PANEL
Cholesterol: 203 mg/dL — ABNORMAL HIGH (ref 0–200)
HDL: 75 mg/dL (ref >39)
Total CHOL/HDL Ratio: 2.7 Ratio
VLDL: 13 mg/dL (ref 0–40)

## 2013-02-25 MED ORDER — SULFAMETHOXAZOLE-TRIMETHOPRIM 800-160 MG PO TABS: 1.0000 | ORAL_TABLET | Freq: Two times a day (BID) | ORAL | Status: DC

## 2013-02-25 NOTE — Assessment & Plan Note (Addendum)
UA notes large leuks and mod blood. Will rx with bactrim, send urine for culture. I have asked the pt to return to our lab in 1 month to complete follow up ua due to microscopic hematuria.

## 2013-02-25 NOTE — Progress Notes (Signed)
Subjective:    Patient ID: Cassandra Miles, female    DOB: Jul 02, 1941, 72 y.o.   MRN: [id number]  HPI  [name] is a 72 yr old female who presents today for follow up of multiple medical problems.  1) Hyperlipidemia- she was placed on statin last visit.  Denies myalgia.  2) Back pain- last visit a short course of NSAIDS was recommended. Sh reports that shdid some PT which helped.  Was told by ortho that she has spinal stenosis per MRI.  She is continuing back exercises.    3) Diarrhea- had endoscopy last week with Dr. Juanda Chance.  Path neg for sprue.   4) balance problem- reports this is unchanged.  5) vaginal discharge- reports she has had this for "lord knows how long."  Discharge is green/pastey.  Not malodorous. She reports last pap was 3 years ago.    Review of Systems See HPI  Past Medical History  Diagnosis Date  . Restless leg syndrome   . Hypertension   . A-fib 2008    brief episode  . Palpitations   . Sleep apnea   . Depression   . Peptic ulcer disease   . GERD (gastroesophageal reflux disease)   . Barrett esophagus   . Avascular necrosis   . Osteoarthritis     knee  . Hepatic cyst   . Hyperlipidemia   . Blood transfusion without reported diagnosis     WITH  HIP REPLACEMENT 06,07  . Cataract   . Spinal stenosis     History   Social History  . Marital Status: Divorced    Spouse Name: N/A    Number of Children: N/A  . Years of Education: N/A   Occupational History  . retired     Runner, broadcasting/film/video   Social History Main Topics  . Smoking status: Never Smoker   . Smokeless tobacco: Never Used  . Alcohol Use: Yes     Comment: SOCIAL  . Drug Use: No  . Sexually Active: Not on file   Other Topics Concern  . Not on file   Social History Narrative   Moved from [place], [place]    Past Surgical History  Procedure Laterality Date  . Hip fracture surgery      Bilateral hip replacements  . Orif wrist fracture  09/14/2011    Procedure: OPEN REDUCTION INTERNAL  FIXATION (ORIF) WRIST FRACTURE;  Surgeon: Oletta Cohn III;  Location: [facility];  Service: Orthopedics;  Laterality: N/A;  . Cholecystectomy    . Tubal ligation    . Decompression core hip      bilateral hips  . Breast reduction surgery    . Colonoscopy      Family History  Problem Relation Age of Onset  . Arthritis Father   . Stroke Mother     x2  . Heart disease Brother     Stenting   . Hyperlipidemia Brother   . Melanoma Brother   . Colon cancer Paternal Aunt     x 2  . Diabetes Brother   . Heart disease Mother     Allergies  Allergen Reactions  . Bupropion Hcl Other (See Comments)    Made her feel bad  . Ciprofloxacin Nausea And Vomiting    REACTION: hives  . Penicillins Rash    REACTION: rash    Current Outpatient Prescriptions on File Prior to Visit  Medication Sig Dispense Refill  . amLODipine (NORVASC) 5 MG tablet Take 1 tablet (5 mg total)  by mouth 2 (two) times daily.  60 tablet  6  . atorvastatin (LIPITOR) 40 MG tablet Take 1 tablet (40 mg total) by mouth daily.  30 tablet  2  . fluocinonide-emollient (LIDEX-E) 0.05 % cream Apply topically 2 (two) times daily.  30 g  0  . lisinopril (PRINIVIL,ZESTRIL) 20 MG tablet Take 1 tablet (20 mg total) by mouth daily.  30 tablet  6  . Multiple Vitamin (MULTIVITAMIN) tablet Take 1 tablet by mouth daily.      . naproxen sodium (ANAPROX) 220 MG tablet Take 220 mg by mouth 2 (two) times daily with a meal.      . omeprazole (PRILOSEC) 40 MG capsule 1 caps bid x 4 weeks then reduce to 1 caps po qhs  69 capsule  6  . rOPINIRole (REQUIP) 1 MG tablet take 1 tablet by mouth at bedtime  30 tablet  2  . sertraline (ZOLOFT) 100 MG tablet take 1 and 1/2 tablets by mouth once daily  45 tablet  2   No current facility-administered medications on file prior to visit.    BP 120/88  Pulse 66  Temp(Src) 97.8 F (36.6 C) (Oral)  Resp 16  Ht 5\' 7"  (1.702 m)  Wt 190 lb 1.9 oz (86.238 kg)  BMI 29.77 kg/m2  SpO2 98%        Objective:   Physical Exam  Constitutional: She is oriented to person, place, and time. She appears well-developed and well-nourished. No distress.  HENT:  Head: Normocephalic and atraumatic.  Cardiovascular: Normal rate and regular rhythm.   No murmur heard. Pulmonary/Chest: Effort normal and breath sounds normal. No respiratory distress. She has no wheezes. She has no rales. She exhibits no tenderness.  Genitourinary:  Vaginal mucosa appears irritated, normal cervix, normal uterus, normal adnexa  Neurological: She is alert and oriented to person, place, and time.  Psychiatric: She has a normal mood and affect. Her behavior is normal. Judgment and thought content normal.          Assessment & Plan:

## 2013-02-25 NOTE — Assessment & Plan Note (Signed)
Wet prep performed today along with routine pap smear.

## 2013-02-25 NOTE — Assessment & Plan Note (Signed)
Improved with PT, continue same.

## 2013-02-25 NOTE — Patient Instructions (Addendum)
Please complete your lab work prior to leaving. Please schedule a follow up appointment in 6 months.   

## 2013-02-25 NOTE — Assessment & Plan Note (Signed)
Tolerating statin.  Obtain flp/lft.

## 2013-02-25 NOTE — Telephone Encounter (Signed)
I have asked pt to return to lab in 1 month for repeat UA- dx hematuria. Please send order to lab. thanks

## 2013-02-25 NOTE — Assessment & Plan Note (Signed)
Sprue testing is negative.  Management per GI.

## 2013-02-26 LAB — URINE CULTURE: Organism ID, Bacteria: NO GROWTH

## 2013-02-26 LAB — WET PREP BY MOLECULAR PROBE
Candida species: NEGATIVE
Gardnerella vaginalis: NEGATIVE

## 2013-02-27 ENCOUNTER — Encounter: Payer: Self-pay | Admitting: Family

## 2013-03-04 ENCOUNTER — Encounter: Payer: Self-pay | Admitting: Family

## 2013-03-18 ENCOUNTER — Other Ambulatory Visit: Payer: Self-pay | Admitting: Internal Medicine

## 2013-04-29 ENCOUNTER — Other Ambulatory Visit: Payer: Self-pay

## 2013-05-16 ENCOUNTER — Other Ambulatory Visit: Payer: Self-pay | Admitting: Internal Medicine

## 2013-06-15 ENCOUNTER — Other Ambulatory Visit: Payer: Self-pay | Admitting: Family

## 2013-07-24 ENCOUNTER — Encounter: Payer: Self-pay | Admitting: Physician Assistant

## 2013-07-24 ENCOUNTER — Ambulatory Visit (INDEPENDENT_AMBULATORY_CARE_PROVIDER_SITE_OTHER): Payer: Medicare Other | Admitting: Physician Assistant

## 2013-07-24 VITALS — BP 124/72 | HR 66 | Temp 97.8°F | Resp 14 | Ht 67.0 in | Wt 186.8 lb

## 2013-07-24 DIAGNOSIS — IMO0002 Reserved for concepts with insufficient information to code with codable children: Secondary | ICD-10-CM

## 2013-07-24 DIAGNOSIS — M5416 Radiculopathy, lumbar region: Secondary | ICD-10-CM

## 2013-07-24 DIAGNOSIS — R011 Cardiac murmur, unspecified: Secondary | ICD-10-CM

## 2013-07-24 DIAGNOSIS — M545 Low back pain: Secondary | ICD-10-CM

## 2013-07-24 NOTE — Patient Instructions (Signed)
Please obtain imaging.  I will call you with your results.  We will schedule an MRI depending on X-ray results.  You will be contacted by Neurosurgery for an appointment.  You will also be contacted by Cardiology for an appointment.  Please call or return to clinic if symptoms worsen.  For now continue with water aerobics and Requip for symptom relief.

## 2013-07-26 NOTE — Progress Notes (Signed)
Patient ID: Cassandra Miles, female   DOB: [birth year], 72 y.o.   MRN: [id number]  Patient presents to clinic today c/o of low back pain that radiates into her legs bilaterally.  Pain has been present since hip replacement surgery in 2006-2007.  Patient was previously evaluated by Ortho and was told she had spinal stenosis on MRI scan of lumbar spine.  Patient states that pain used to occur in episodes and was generally eventually relieved with NSAIDs and physical therapy exercises.  Patient states now the pain has become more frequent, and she has noticed some weakness in her lower extremities.  Denies change in bowel or bladder habits.  Denies saddle anesthesia.  Denies tingling in her legs but notices some numbness of feet associated with painful episodes.    Patient also requesting referral to Cardiology due to history of Atrial Fibrillation diagnosed in 2012.  Patient states she occasionally notes her heart fluttering.  Denies chest pain, shortness of breath, N/V.  Has history of HTN and HLD, but denies history of heart attack or stroke.  Had echocardiogram earlier this year that was within normal limits.  Last EKG showed fascicular block but no other abnormal rhythms.  Vital signs within normal limits.  Patient asymptomatic at time of interview and examination.  Past Medical History  Diagnosis Date  . Restless leg syndrome   . Hypertension   . A-fib 2008    brief episode  . Palpitations   . Sleep apnea   . Depression   . Peptic ulcer disease   . GERD (gastroesophageal reflux disease)   . Barrett esophagus   . Avascular necrosis   . Osteoarthritis     knee  . Hepatic cyst   . Hyperlipidemia   . Blood transfusion without reported diagnosis     WITH  HIP REPLACEMENT 06,07  . Cataract   . Spinal stenosis     Current Outpatient Prescriptions on File Prior to Visit  Medication Sig Dispense Refill  . amLODipine (NORVASC) 5 MG tablet Take 1 tablet (5 mg total) by mouth 2 (two) times daily.   60 tablet  6  . Multiple Vitamin (MULTIVITAMIN) tablet Take 1 tablet by mouth daily.      . naproxen sodium (ANAPROX) 220 MG tablet Take 220 mg by mouth 2 (two) times daily with a meal.      . omeprazole (PRILOSEC) 40 MG capsule 1 caps bid x 4 weeks then reduce to 1 caps po qhs  69 capsule  6  . rOPINIRole (REQUIP) 1 MG tablet take 1 tablet by mouth at bedtime  30 tablet  2  . sertraline (ZOLOFT) 100 MG tablet take 1 and 1/2 tablets by mouth once daily  45 tablet  4   No current facility-administered medications on file prior to visit.    Allergies  Allergen Reactions  . Bupropion Hcl Other (See Comments)    Made her feel bad  . Ciprofloxacin Nausea And Vomiting    REACTION: hives  . Penicillins Rash    REACTION: rash    Family History  Problem Relation Age of Onset  . Arthritis Father   . Stroke Mother     x2  . Heart disease Brother     Stenting   . Hyperlipidemia Brother   . Melanoma Brother   . Colon cancer Paternal Aunt     x 2  . Diabetes Brother   . Heart disease Mother     History   Social History  .  Marital Status: Divorced    Spouse Name: N/A    Number of Children: N/A  . Years of Education: N/A   Occupational History  . retired     Runner, broadcasting/film/video   Social History Main Topics  . Smoking status: Never Smoker   . Smokeless tobacco: Never Used  . Alcohol Use: Yes     Comment: SOCIAL  . Drug Use: No  . Sexual Activity: None   Other Topics Concern  . None   Social History Narrative   Moved from [place], [place]   ROS See HPI.  All other ROS are negative.   Filed Vitals:   07/24/13 1143  BP: 124/72  Pulse: 66  Temp: 97.8 F (36.6 C)  Resp: 14   Physical Exam  Vitals reviewed. Constitutional: She is oriented to person, place, and time and well-developed, well-nourished, and in no distress.  HENT:  Head: Normocephalic and atraumatic.  Eyes: Conjunctivae are normal. Pupils are equal, round, and reactive to light.  Neck: Neck supple. No  thyromegaly present.  Cardiovascular: Normal rate, regular rhythm and intact distal pulses.   Murmur heard.  Systolic murmur is present with a grade of 2/6  Pulmonary/Chest: Effort normal and breath sounds normal. No respiratory distress. She has no wheezes. She has no rales. She exhibits no tenderness.  Musculoskeletal:  Pain reproduced with internal and external rotation of bilateral hips.   Straight leg test positive.  No bony abnormality or tenderness noted on examination.  Lymphadenopathy:    She has no cervical adenopathy.  Neurological: She is alert and oriented to person, place, and time.  Skin: Skin is warm and dry. No rash noted.  Psychiatric: Affect normal.   No results found for this or any previous visit (from the past 2160 hour(s)).  Assessment/Plan: Undiagnosed cardiac murmurs EKG with sinus rhythm but AF block; relatively unchanged from prior EKG.  Echocardiogram with no significant abnormalities.  Will refer to cardiology for further assessment and management.  Low back pain radiating to left leg Repeat DG lumbar spine.  Will follow-up as needed with MRI lumbar spine.  Referral to Neurosurgery for evaluation.  Continue conservative measures until appointment to include -- NSAIDs, rest, Requip, Physical therapy exercises and water aerobics.

## 2013-07-26 NOTE — Assessment & Plan Note (Signed)
EKG with sinus rhythm but AF block; relatively unchanged from prior EKG.  Echocardiogram with no significant abnormalities.  Will refer to cardiology for further assessment and management.

## 2013-07-26 NOTE — Assessment & Plan Note (Signed)
Repeat DG lumbar spine.  Will follow-up as needed with MRI lumbar spine.  Referral to Neurosurgery for evaluation.  Continue conservative measures until appointment to include -- NSAIDs, rest, Requip, Physical therapy exercises and water aerobics.

## 2013-07-27 ENCOUNTER — Telehealth: Payer: Self-pay | Admitting: *Deleted

## 2013-07-27 NOTE — Telephone Encounter (Signed)
LMOM with contact name and number for return call RE: Unable to schedule appointment for Neurology referral until Lumbar Xray ordered is completed and we have the results and also, to give information on Cardiology referral initial appt scheduled for 11.13.14 at 11:00am for heart murmur/SLS

## 2013-07-27 NOTE — Telephone Encounter (Signed)
Patient called back and spoke with Marg, will complete Xray & given Cardiololgy information/SLS

## 2013-07-28 ENCOUNTER — Ambulatory Visit (HOSPITAL_BASED_OUTPATIENT_CLINIC_OR_DEPARTMENT_OTHER)
Admission: RE | Admit: 2013-07-28 | Discharge: 2013-07-28 | Disposition: A | Discharge status: Home / Self Care | Payer: Medicare Other | Source: Ambulatory Visit | Attending: Physician Assistant | Admitting: Physician Assistant

## 2013-07-28 DIAGNOSIS — M51379 Other intervertebral disc degeneration, lumbosacral region without mention of lumbar back pain or lower extremity pain: Secondary | ICD-10-CM | POA: Insufficient documentation

## 2013-07-28 DIAGNOSIS — M79609 Pain in unspecified limb: Secondary | ICD-10-CM | POA: Insufficient documentation

## 2013-07-28 DIAGNOSIS — M5416 Radiculopathy, lumbar region: Secondary | ICD-10-CM

## 2013-07-28 DIAGNOSIS — M47817 Spondylosis without myelopathy or radiculopathy, lumbosacral region: Secondary | ICD-10-CM | POA: Insufficient documentation

## 2013-07-28 DIAGNOSIS — M5137 Other intervertebral disc degeneration, lumbosacral region: Secondary | ICD-10-CM | POA: Insufficient documentation

## 2013-07-29 ENCOUNTER — Telehealth: Payer: Self-pay | Admitting: Family

## 2013-07-29 NOTE — Telephone Encounter (Signed)
Patient informed, understood & agreed/SLS  

## 2013-07-29 NOTE — Telephone Encounter (Signed)
Patient is requesting results.

## 2013-07-29 NOTE — Telephone Encounter (Signed)
Message copied by Regis Bill on Wed Jul 29, 2013  2:40 PM ------      Message from: Marcelline Mates      Created: Wed Jul 29, 2013 12:29 PM       Please inform patient that her Xray shows degenerative (arthritic) changes of her lower spine.  Referral to Neurosurgery has been placed and they will be scheduling an MRI for her. ------

## 2013-07-30 ENCOUNTER — Other Ambulatory Visit: Payer: Self-pay

## 2013-08-06 ENCOUNTER — Ambulatory Visit: Payer: Medicare Other | Admitting: Cardiology

## 2013-08-24 ENCOUNTER — Ambulatory Visit (HOSPITAL_COMMUNITY): Payer: Medicare Other | Attending: Cardiology

## 2013-08-24 ENCOUNTER — Ambulatory Visit (INDEPENDENT_AMBULATORY_CARE_PROVIDER_SITE_OTHER): Payer: Medicare Other | Admitting: Cardiology

## 2013-08-24 ENCOUNTER — Encounter: Payer: Self-pay | Admitting: Cardiology

## 2013-08-24 VITALS — BP 122/88 | HR 64 | Ht 67.0 in | Wt 183.0 lb

## 2013-08-24 DIAGNOSIS — E785 Hyperlipidemia, unspecified: Secondary | ICD-10-CM | POA: Insufficient documentation

## 2013-08-24 DIAGNOSIS — R55 Syncope and collapse: Secondary | ICD-10-CM

## 2013-08-24 DIAGNOSIS — I4891 Unspecified atrial fibrillation: Secondary | ICD-10-CM | POA: Insufficient documentation

## 2013-08-24 DIAGNOSIS — R011 Cardiac murmur, unspecified: Secondary | ICD-10-CM

## 2013-08-24 DIAGNOSIS — R42 Dizziness and giddiness: Secondary | ICD-10-CM

## 2013-08-24 DIAGNOSIS — R002 Palpitations: Secondary | ICD-10-CM

## 2013-08-24 DIAGNOSIS — I739 Peripheral vascular disease, unspecified: Secondary | ICD-10-CM | POA: Insufficient documentation

## 2013-08-24 DIAGNOSIS — R404 Transient alteration of awareness: Secondary | ICD-10-CM | POA: Insufficient documentation

## 2013-08-24 DIAGNOSIS — I2510 Atherosclerotic heart disease of native coronary artery without angina pectoris: Secondary | ICD-10-CM | POA: Insufficient documentation

## 2013-08-24 MED ORDER — ASPIRIN EC 81 MG PO TBEC: 81.0000 mg | DELAYED_RELEASE_TABLET | Freq: Every day | ORAL | Status: DC

## 2013-08-24 NOTE — Progress Notes (Signed)
Patient ID: [name], female   DOB: [birth year], 72 y.o.   MRN: [id number]     Patient Name: Cassandra Miles Date of Encounter: 08/24/2013  Primary Care Provider:  Lemont Fillers., NP Primary Cardiologist:  Tobias Alexander, H   Problem List   Past Medical History  Diagnosis Date  . Restless leg syndrome   . Hypertension   . A-fib 2008    brief episode  . Palpitations   . Sleep apnea   . Depression   . Peptic ulcer disease   . GERD (gastroesophageal reflux disease)   . Barrett esophagus   . Avascular necrosis   . Osteoarthritis     knee  . Hepatic cyst   . Hyperlipidemia   . Blood transfusion without reported diagnosis     WITH  HIP REPLACEMENT 06,07  . Cataract   . Spinal stenosis    Past Surgical History  Procedure Laterality Date  . Hip fracture surgery      Bilateral hip replacements  . Orif wrist fracture  09/14/2011    Procedure: OPEN REDUCTION INTERNAL FIXATION (ORIF) WRIST FRACTURE;  Surgeon: Oletta Cohn III;  Location: [facility];  Service: Orthopedics;  Laterality: N/A;  . Cholecystectomy    . Tubal ligation    . Decompression core hip      bilateral hips  . Breast reduction surgery    . Colonoscopy      Allergies  Allergies  Allergen Reactions  . Bupropion Hcl Other (See Comments)    Made her feel bad  . Ciprofloxacin Nausea And Vomiting    REACTION: hives  . Penicillins Rash    REACTION: rash    HPI  72 year old female with h/o paroxysmal atrial fibrillation, hypertension who is coming to reestablish cardiology care. The patient was diagnosed with paroxysmal atrial fibrillation about 6 years ago, at that time, she was not started on any medication. She states that her palpitations have been almost every day and lasts few minutes, every once a while she feels short of breath with them but doesn't have significant dizziness and denies any prior medical history of syncope. The patient states that about is very concerning for her that  recently she developed episodes of forgetfulness or absent mindedness when she is involved in conversation with someone and then forgets what she was saying.  Home Medications  Prior to Admission medications   Medication Sig Start Date End Date Taking? Authorizing Provider  amLODipine (NORVASC) 5 MG tablet Take 1 tablet (5 mg total) by mouth 2 (two) times daily. 08/19/12   Edwyna Perfect, MD  Multiple Vitamin (MULTIVITAMIN) tablet Take 1 tablet by mouth daily.    Historical Provider, MD  naproxen sodium (ANAPROX) 220 MG tablet Take 220 mg by mouth 2 (two) times daily with a meal.    Historical Provider, MD  omeprazole (PRILOSEC) 40 MG capsule 1 caps bid x 4 weeks then reduce to 1 caps po qhs 10/07/12   Hart Carwin, MD  rOPINIRole (REQUIP) 1 MG tablet take 1 tablet by mouth at bedtime 06/15/13   Sandford Craze, NP  sertraline (ZOLOFT) 100 MG tablet take 1 and 1/2 tablets by mouth once daily 05/16/13   Sandford Craze, NP    Family History  Family History  Problem Relation Age of Onset  . Arthritis Father   . Stroke Mother     x2  . Heart disease Brother     Stenting   . Hyperlipidemia Brother   .  Melanoma Brother   . Colon cancer Paternal Aunt     x 2  . Diabetes Brother   . Heart disease Mother     Social History  History   Social History  . Marital Status: Divorced    Spouse Name: N/A    Number of Children: N/A  . Years of Education: N/A   Occupational History  . retired     Runner, broadcasting/film/video   Social History Main Topics  . Smoking status: Never Smoker   . Smokeless tobacco: Never Used  . Alcohol Use: Yes     Comment: SOCIAL  . Drug Use: No  . Sexual Activity: Not on file   Other Topics Concern  . Not on file   Social History Narrative   Moved from [place], [place]     Review of Systems, as per HPI, otherwise negative General:  No chills, fever, night sweats or weight changes.  Cardiovascular:  No chest pain, dyspnea on exertion, edema, orthopnea,  palpitations, paroxysmal nocturnal dyspnea. Dermatological: No rash, lesions/masses Respiratory: No cough, dyspnea Urologic: No hematuria, dysuria Abdominal:   No nausea, vomiting, diarrhea, bright red blood per rectum, melena, or hematemesis Neurologic:  No visual changes, wkns, changes in mental status. All other systems reviewed and are otherwise negative except as noted above.  Physical Exam   General: Pleasant, NAD Psych: Normal affect. Neuro: Alert and oriented X 3. Moves all extremities spontaneously. HEENT: Normal  Neck: Supple without bruits or JVD. No bruit B/L Lungs:  Resp regular and unlabored, CTA. Heart: RRR no s3, s4, systolic 2/6 murmur at the LLSB Abdomen: Soft, non-tender, non-distended, BS + x 4.  Extremities: No clubbing, cyanosis or edema. DP/PT/Radials 2+ and equal bilaterally.  Labs:  No results found for this basename: CKTOTAL, CKMB, TROPONINI,  in the last 72 hours Lab Results  Component Value Date   WBC 8.0 08/12/2012   HGB 14.3 08/12/2012   HCT 41.7 08/12/2012   MCV 87.1 08/12/2012   PLT 327 08/12/2012   No results found for this basename: NA, K, CL, CO2, BUN, CREATININE, CALCIUM, LABALBU, PROT, BILITOT, ALKPHOS, ALT, AST, GLUCOSE,  in the last 168 hours Lab Results  Component Value Date   CHOL 203* 02/25/2013   HDL 75 02/25/2013   LDLCALC 098* 02/25/2013   TRIG 67 02/25/2013   No results found for this basename: DDIMER   No components found with this basename: POCBNP,   Accessory Clinical Findings  Echocardiogram - none   ECG -sinus rhythm, 64 beats per minute, incomplete right bundle branch block, left anterior fascicular block, poor R wave progression in the precordial leads.   Assessment & Plan  A very pleasant 72 year old female  1. Paroxysmal atrial fibrillation - diagnosed 6 years ago on no treatment or anticoagulation. The patient feels palpitations daily, we will start life watch monitor for 2 weeks to evaluate for atrial fibrillation  and its burden. We will also order 2-D echocardiogram to evaluate for left ventricular function and left atrial size. We will order basic metabolic profile, magnesium and TSH. Most part the patient on aspirin 81 mg daily for now  2. New forgetfulness, absentmindedness - this might be related to passible embolic events associated with atrial fibrillation. We will also order bilateral carotid ultrasound. We will start the patient on aspirin 81 mg daily for now  3. Heart murmur - most probably mitral regurgitation - we will oredr TTE  4. Hyperlipidemia - elevated LDL , 10 year [place] CHD risk is 5%,  only lifestyle modifications for now  5. Muscle cramps we'll check electrolytes including magnesium.  Follow up in 1 month   Lars Masson, MD, [facility] 08/24/2013, 11:24 AM

## 2013-08-24 NOTE — Patient Instructions (Signed)
**Note De-Identified Cassandra Miles Obfuscation** Your physician has requested that you have an echocardiogram. Echocardiography is a painless test that uses sound waves to create images of your heart. It provides your doctor with information about the size and shape of your heart and how well your heart's chambers and valves are working. This procedure takes approximately one hour. There are no restrictions for this procedure.  Your physician has recommended that you wear an Lif[employer] event monitor for 2 weeks. Event monitors are medical devices that record the heart's electrical activity. Doctors most often [place] these monitors to diagnose arrhythmias. Arrhythmias are problems with the speed or rhythm of the heartbeat. The monitor is a small, portable device. You can wear one while you do your normal daily activities. This is usually used to diagnose what is causing palpitations/syncope (passing out).  Your physician has requested that you have a carotid duplex. This test is an ultrasound of the carotid arteries in your neck. It looks at blood flow through these arteries that supply the brain with blood. Allow one hour for this exam. There are no restrictions or special instructions.  Your physician has recommended you make the following change in your medication: start taking Aspirin 81 mg daily  Your physician recommends that you schedule a follow-up appointment in: after tests

## 2013-08-25 ENCOUNTER — Encounter (INDEPENDENT_AMBULATORY_CARE_PROVIDER_SITE_OTHER): Payer: Medicare Other

## 2013-08-25 ENCOUNTER — Encounter: Payer: Self-pay | Admitting: *Deleted

## 2013-08-25 DIAGNOSIS — I4891 Unspecified atrial fibrillation: Secondary | ICD-10-CM

## 2013-08-25 DIAGNOSIS — R002 Palpitations: Secondary | ICD-10-CM

## 2013-08-25 NOTE — Progress Notes (Signed)
Patient ID: Cassandra Miles, female   DOB: 1941/05/28, 72 y.o.   MRN: [id number] E-Cardio verite 14 day cardiac event monitor applied to patient.

## 2013-08-31 ENCOUNTER — Ambulatory Visit: Payer: Medicare Other | Admitting: Family

## 2013-09-09 ENCOUNTER — Other Ambulatory Visit (HOSPITAL_COMMUNITY): Payer: Medicare Other

## 2013-09-10 ENCOUNTER — Ambulatory Visit: Payer: Medicare Other | Admitting: Cardiology

## 2013-09-10 ENCOUNTER — Other Ambulatory Visit: Payer: Self-pay | Admitting: Family

## 2013-09-11 NOTE — Telephone Encounter (Signed)
Rx request to pharmacy/SLS  

## 2013-09-23 ENCOUNTER — Telehealth: Payer: Self-pay | Admitting: *Deleted

## 2013-09-23 NOTE — Telephone Encounter (Signed)
LMTCB  Normal monitor results. Needs labs drawn BMP, Mg, TSH per Dr. Delton See. They were not drawn at last office visit Mylo Red RN

## 2013-09-30 ENCOUNTER — Ambulatory Visit (INDEPENDENT_AMBULATORY_CARE_PROVIDER_SITE_OTHER): Payer: Medicare Other | Admitting: Cardiology

## 2013-09-30 ENCOUNTER — Encounter: Payer: Self-pay | Admitting: Cardiology

## 2013-09-30 VITALS — BP 150/92 | HR 74 | Ht 67.0 in | Wt 178.0 lb

## 2013-09-30 DIAGNOSIS — R079 Chest pain, unspecified: Secondary | ICD-10-CM | POA: Insufficient documentation

## 2013-09-30 DIAGNOSIS — I4891 Unspecified atrial fibrillation: Secondary | ICD-10-CM

## 2013-09-30 MED ORDER — METOPROLOL TARTRATE 25 MG PO TABS: 25.0000 mg | ORAL_TABLET | Freq: Two times a day (BID) | ORAL | Status: DC

## 2013-09-30 NOTE — Patient Instructions (Signed)
**Note De-Identified Monzerrat Wellen Obfuscation** Your physician has recommended you make the following change in your medication: start taking Metoprolol 25 mg twice daily  Your physician has requested that you have an exercise tolerance test. For further information please visit [email or web address]. Please also follow instruction sheet, as given.  Your physician has recommended that you wear a 48 hour holter monitor. Holter monitors are medical devices that record the heart's electrical activity. Doctors most often use these monitors to diagnose arrhythmias. Arrhythmias are problems with the speed or rhythm of the heartbeat. The monitor is a small, portable device. You can wear one while you do your normal daily activities. This is usually used to diagnose what is causing palpitations/syncope (passing out).  Your physician recommends that you schedule a follow-up appointment in: after all tests are complete

## 2013-09-30 NOTE — Progress Notes (Signed)
Patient ID: Cassandra Miles, female   DOB: 01-24-1941, 73 y.o.   MRN: [id number]     Patient Name: Cassandra Miles Date of Encounter: 09/30/2013  Primary Care Provider:  Nance Pear., NP Primary Cardiologist:  Ena Dawley, H   Problem List   Past Medical History  Diagnosis Date  . Restless leg syndrome   . Hypertension   . A-fib 2008    brief episode  . Palpitations   . Sleep apnea   . Depression   . Peptic ulcer disease   . GERD (gastroesophageal reflux disease)   . Barrett esophagus   . Avascular necrosis   . Osteoarthritis     knee  . Hepatic cyst   . Hyperlipidemia   . Blood transfusion without reported diagnosis     WITH  HIP REPLACEMENT 06,07  . Cataract   . Spinal stenosis    Past Surgical History  Procedure Laterality Date  . Hip fracture surgery      Bilateral hip replacements  . Orif wrist fracture  09/14/2011    Procedure: OPEN REDUCTION INTERNAL FIXATION (ORIF) WRIST FRACTURE;  Surgeon: Willa Frater III;  Location: [facility];  Service: Orthopedics;  Laterality: N/A;  . Cholecystectomy    . Tubal ligation    . Decompression core hip      bilateral hips  . Breast reduction surgery    . Colonoscopy      Allergies  Allergies  Allergen Reactions  . Bupropion Hcl Other (See Comments)    Made her feel bad  . Ciprofloxacin Nausea And Vomiting    REACTION: hives  . Penicillins Rash    REACTION: rash    HPI  73 year old female with h/o paroxysmal atrial fibrillation, hypertension who is coming to reestablish cardiology care. The patient was diagnosed with paroxysmal atrial fibrillation about 6 years ago, at that time, she was not started on any medication. She states that her palpitations have been almost every day and lasts few minutes, every once a while she feels short of breath with them but doesn't have significant dizziness and denies any prior medical history of syncope. The patient states that about is very concerning for her that  recently she developed episodes of forgetfulness or absent mindedness when she is involved in conversation with someone and then forgets what she was saying.  After 1 month follow up she admits to wearing her ecardio monitor for only 1 day. She notived that her BP is elevated to up to 854 systolic. She exercises for 30 minutes in the gym 3x/week with no symptoms, but complains of retrosternal pressure when being stressed.   Home Medications  Prior to Admission medications   Medication Sig Start Date End Date Taking? Authorizing Provider  amLODipine (NORVASC) 5 MG tablet Take 1 tablet (5 mg total) by mouth 2 (two) times daily. 08/19/12   Burnice Logan, MD  Multiple Vitamin (MULTIVITAMIN) tablet Take 1 tablet by mouth daily.    Historical Provider, MD  naproxen sodium (ANAPROX) 220 MG tablet Take 220 mg by mouth 2 (two) times daily with a meal.    Historical Provider, MD  omeprazole (PRILOSEC) 40 MG capsule 1 caps bid x 4 weeks then reduce to 1 caps po qhs 10/07/12   Lafayette Dragon, MD  rOPINIRole (REQUIP) 1 MG tablet take 1 tablet by mouth at bedtime 06/15/13   Debbrah Alar, NP  sertraline (ZOLOFT) 100 MG tablet take 1 and 1/2 tablets by mouth once daily  05/16/13   Debbrah Alar, NP    Family History  Family History  Problem Relation Age of Onset  . Arthritis Father   . Stroke Mother     x2  . Heart disease Brother     Stenting   . Hyperlipidemia Brother   . Melanoma Brother   . Colon cancer Paternal Aunt     x 2  . Diabetes Brother   . Heart disease Mother     Social History  History   Social History  . Marital Status: Divorced    Spouse Name: N/A    Number of Children: N/A  . Years of Education: N/A   Occupational History  . retired     Pharmacist, hospital   Social History Main Topics  . Smoking status: Never Smoker   . Smokeless tobacco: Never Used  . Alcohol Use: Yes     Comment: SOCIAL  . Drug Use: No  . Sexual Activity: Not on file   Other Topics Concern  .  Not on file   Social History Narrative   Moved from [place], [place], as per HPI, otherwise negative General:  No chills, fever, night sweats or weight changes.  Cardiovascular:  No chest pain, dyspnea on exertion, edema, orthopnea, palpitations, paroxysmal nocturnal dyspnea. Dermatological: No rash, lesions/masses Respiratory: No cough, dyspnea Urologic: No hematuria, dysuria Abdominal:   No nausea, vomiting, diarrhea, bright red blood per rectum, melena, or hematemesis Neurologic:  No visual changes, wkns, changes in mental status. All other systems reviewed and are otherwise negative except as noted above.  Physical Exam  Blood pressure 150/92 heart rate 74 General: Pleasant, NAD Psych: Normal affect. Neuro: Alert and oriented X 3. Moves all extremities spontaneously. HEENT: Normal  Neck: Supple without bruits or JVD. No bruit B/L Lungs:  Resp regular and unlabored, CTA. Heart: RRR no s3, s4, systolic 2/6 murmur at the LLSB Abdomen: Soft, non-tender, non-distended, BS + x 4.  Extremities: No clubbing, cyanosis or edema. DP/PT/Radials 2+ and equal bilaterally.  Labs:  No results found for this basename: CKTOTAL, CKMB, TROPONINI,  in the last 72 hours Lab Results  Component Value Date   WBC 8.0 08/12/2012   HGB 14.3 08/12/2012   HCT 41.7 08/12/2012   MCV 87.1 08/12/2012   PLT 327 08/12/2012   No results found for this basename: NA, K, CL, CO2, BUN, CREATININE, CALCIUM, LABALBU, PROT, BILITOT, ALKPHOS, ALT, AST, GLUCOSE,  in the last 168 hours Lab Results  Component Value Date   CHOL 203* 02/25/2013   HDL 75 02/25/2013   LDLCALC 115* 02/25/2013   TRIG 67 02/25/2013   No results found for this basename: DDIMER   No components found with this basename: POCBNP,   Accessory Clinical Findings  Echocardiogram - 11/19/2012 ------------------------------------------------------------ Study Conclusions  - Left ventricle: The cavity size was normal. Wall  thickness was normal. Systolic function was normal. The estimated ejection fraction was in the range of 60% to 65%. Wall motion was normal; there were no regional wall motion abnormalities. There was an increased relative contribution of atrial contraction to ventricular filling. - Tricuspid valve: Moderate regurgitation. - Pulmonary arteries: PA peak pressure: 8mm Hg (S).   ECG -sinus rhythm, 64 beats per minute, incomplete right bundle branch block, left anterior fascicular block, poor R wave progression in the precordial leads.   Assessment & Plan  A very pleasant 73 year old female  1. Paroxysmal atrial fibrillation - diagnosed 6 years ago on no  treatment or anticoagulation. The patient feels palpitations daily, we will start life watch monitor for 2 weeks to evaluate for atrial fibrillation and its burden. Echo showed normal LV systolic fx, normal LA size and moderate TR, since she didn't wear her e-cardio, we will order a 48-hour Holter monitor.  2. Chest pain - atypical, however risk factors, we will order an exercise treadmill stress test and also evaluate for BP response at stress  3. New forgetfulness, absentmindedness - this might be related to passible embolic events associated with atrial fibrillation. Carotid ultrasound was normal B/L. We will start the patient on aspirin 81 mg daily for now  4. Hypertension - add metoprolol 25 mg po bid, might also work for parox a-fib, we will order an exercise treadmill stress test to evaluate for BP response at stress  5. Hyperlipidemia - elevated LDL , 10 year [place] CHD risk is 5%, only lifestyle modifications for now  6. Muscle cramps advised to use OTC electrolys including Mg  Follow up in 1 month   Dorothy Spark, MD, [facility] 09/30/2013, 10:47 AM

## 2013-10-05 ENCOUNTER — Emergency Department (HOSPITAL_COMMUNITY): Payer: Medicare Other

## 2013-10-05 ENCOUNTER — Encounter (HOSPITAL_COMMUNITY): Payer: Self-pay | Admitting: Emergency Medicine

## 2013-10-05 ENCOUNTER — Observation Stay (HOSPITAL_COMMUNITY)
Admission: EM | Admit: 2013-10-05 | Discharge: 2013-10-06 | Disposition: A | Discharge status: Home / Self Care | Payer: Medicare Other | Attending: Internal Medicine | Admitting: Internal Medicine

## 2013-10-05 DIAGNOSIS — M545 Low back pain, unspecified: Secondary | ICD-10-CM

## 2013-10-05 DIAGNOSIS — M1710 Unilateral primary osteoarthritis, unspecified knee: Secondary | ICD-10-CM | POA: Insufficient documentation

## 2013-10-05 DIAGNOSIS — F3289 Other specified depressive episodes: Secondary | ICD-10-CM

## 2013-10-05 DIAGNOSIS — Z888 Allergy status to other drugs, medicaments and biological substances status: Secondary | ICD-10-CM | POA: Insufficient documentation

## 2013-10-05 DIAGNOSIS — R011 Cardiac murmur, unspecified: Secondary | ICD-10-CM

## 2013-10-05 DIAGNOSIS — E785 Hyperlipidemia, unspecified: Secondary | ICD-10-CM

## 2013-10-05 DIAGNOSIS — Z8719 Personal history of other diseases of the digestive system: Secondary | ICD-10-CM

## 2013-10-05 DIAGNOSIS — E7800 Pure hypercholesterolemia, unspecified: Secondary | ICD-10-CM

## 2013-10-05 DIAGNOSIS — I4891 Unspecified atrial fibrillation: Secondary | ICD-10-CM | POA: Insufficient documentation

## 2013-10-05 DIAGNOSIS — R197 Diarrhea, unspecified: Secondary | ICD-10-CM

## 2013-10-05 DIAGNOSIS — G4733 Obstructive sleep apnea (adult) (pediatric): Secondary | ICD-10-CM

## 2013-10-05 DIAGNOSIS — M1990 Unspecified osteoarthritis, unspecified site: Secondary | ICD-10-CM

## 2013-10-05 DIAGNOSIS — R0789 Other chest pain: Principal | ICD-10-CM | POA: Insufficient documentation

## 2013-10-05 DIAGNOSIS — N898 Other specified noninflammatory disorders of vagina: Secondary | ICD-10-CM

## 2013-10-05 DIAGNOSIS — R2689 Other abnormalities of gait and mobility: Secondary | ICD-10-CM

## 2013-10-05 DIAGNOSIS — R079 Chest pain, unspecified: Secondary | ICD-10-CM

## 2013-10-05 DIAGNOSIS — Z79899 Other long term (current) drug therapy: Secondary | ICD-10-CM | POA: Insufficient documentation

## 2013-10-05 DIAGNOSIS — F32A Depression, unspecified: Secondary | ICD-10-CM | POA: Diagnosis present

## 2013-10-05 DIAGNOSIS — R0602 Shortness of breath: Secondary | ICD-10-CM | POA: Insufficient documentation

## 2013-10-05 DIAGNOSIS — M4800 Spinal stenosis, site unspecified: Secondary | ICD-10-CM | POA: Insufficient documentation

## 2013-10-05 DIAGNOSIS — Z791 Long term (current) use of non-steroidal anti-inflammatories (NSAID): Secondary | ICD-10-CM | POA: Insufficient documentation

## 2013-10-05 DIAGNOSIS — M26629 Arthralgia of temporomandibular joint, unspecified side: Secondary | ICD-10-CM

## 2013-10-05 DIAGNOSIS — K5730 Diverticulosis of large intestine without perforation or abscess without bleeding: Secondary | ICD-10-CM

## 2013-10-05 DIAGNOSIS — M8700 Idiopathic aseptic necrosis of unspecified bone: Secondary | ICD-10-CM

## 2013-10-05 DIAGNOSIS — E669 Obesity, unspecified: Secondary | ICD-10-CM

## 2013-10-05 DIAGNOSIS — G2581 Restless legs syndrome: Secondary | ICD-10-CM

## 2013-10-05 DIAGNOSIS — Z880 Allergy status to penicillin: Secondary | ICD-10-CM | POA: Insufficient documentation

## 2013-10-05 DIAGNOSIS — K7689 Other specified diseases of liver: Secondary | ICD-10-CM

## 2013-10-05 DIAGNOSIS — H269 Unspecified cataract: Secondary | ICD-10-CM | POA: Insufficient documentation

## 2013-10-05 DIAGNOSIS — F329 Major depressive disorder, single episode, unspecified: Secondary | ICD-10-CM | POA: Diagnosis present

## 2013-10-05 DIAGNOSIS — IMO0002 Reserved for concepts with insufficient information to code with codable children: Secondary | ICD-10-CM | POA: Insufficient documentation

## 2013-10-05 DIAGNOSIS — K219 Gastro-esophageal reflux disease without esophagitis: Secondary | ICD-10-CM

## 2013-10-05 DIAGNOSIS — K449 Diaphragmatic hernia without obstruction or gangrene: Secondary | ICD-10-CM

## 2013-10-05 DIAGNOSIS — I10 Essential (primary) hypertension: Secondary | ICD-10-CM

## 2013-10-05 DIAGNOSIS — N390 Urinary tract infection, site not specified: Secondary | ICD-10-CM

## 2013-10-05 DIAGNOSIS — Z7982 Long term (current) use of aspirin: Secondary | ICD-10-CM | POA: Insufficient documentation

## 2013-10-05 DIAGNOSIS — M79605 Pain in left leg: Secondary | ICD-10-CM

## 2013-10-05 DIAGNOSIS — R42 Dizziness and giddiness: Secondary | ICD-10-CM | POA: Insufficient documentation

## 2013-10-05 DIAGNOSIS — K2270 Barrett's esophagus without dysplasia: Secondary | ICD-10-CM | POA: Insufficient documentation

## 2013-10-05 DIAGNOSIS — G4730 Sleep apnea, unspecified: Secondary | ICD-10-CM | POA: Insufficient documentation

## 2013-10-05 DIAGNOSIS — I480 Paroxysmal atrial fibrillation: Secondary | ICD-10-CM

## 2013-10-05 DIAGNOSIS — K279 Peptic ulcer, site unspecified, unspecified as acute or chronic, without hemorrhage or perforation: Secondary | ICD-10-CM | POA: Insufficient documentation

## 2013-10-05 LAB — POCT I-STAT TROPONIN I: Troponin i, poc: 0 ng/mL (ref 0.00–0.08)

## 2013-10-05 LAB — CBC
HEMATOCRIT: 39.6 % (ref 36.0–46.0)
HEMOGLOBIN: 12.9 g/dL (ref 12.0–15.0)
MCH: 30.1 pg (ref 26.0–34.0)
MCHC: 32.6 g/dL (ref 30.0–36.0)
MCV: 92.5 fL (ref 78.0–100.0)
Platelets: 266 10*3/uL (ref 150–400)
RBC: 4.28 MIL/uL (ref 3.87–5.11)
RDW: 14.7 % (ref 11.5–15.5)
WBC: 7.6 10*3/uL (ref 4.0–10.5)

## 2013-10-05 LAB — TROPONIN I
Troponin I: <0.3 ng/mL (ref <0.30)
Troponin I: <0.3 ng/mL (ref <0.30)

## 2013-10-05 LAB — BASIC METABOLIC PANEL
BUN: 20 mg/dL (ref 6–23)
CALCIUM: 8.7 mg/dL (ref 8.4–10.5)
CO2: 28 mEq/L (ref 19–32)
CREATININE: 0.93 mg/dL (ref 0.50–1.10)
Chloride: 106 mEq/L (ref 96–112)
GFR calc Af Amer: 71 mL/min — ABNORMAL LOW (ref >90)
GFR, EST NON AFRICAN AMERICAN: 61 mL/min — AB (ref >90)
GLUCOSE: 96 mg/dL (ref 70–99)
Potassium: 3.8 mEq/L (ref 3.7–5.3)
Sodium: 144 mEq/L (ref 137–147)

## 2013-10-05 LAB — PRO B NATRIURETIC PEPTIDE: Pro B Natriuretic peptide (BNP): 181.6 pg/mL — ABNORMAL HIGH (ref 0–125)

## 2013-10-05 LAB — VITAMIN B12: Vitamin B-12: 281 pg/mL (ref 211–911)

## 2013-10-05 MED ORDER — SERTRALINE HCL 100 MG PO TABS
100.0000 mg | ORAL_TABLET | Freq: Every day | ORAL | Status: DC
  Administered 2013-10-05 – 2013-10-06 (×2): 100 mg via ORAL
  Filled 2013-10-05 (×2): qty 1

## 2013-10-05 MED ORDER — MORPHINE SULFATE 2 MG/ML IJ SOLN: 1.0000 mg | INTRAMUSCULAR | Status: DC | PRN

## 2013-10-05 MED ORDER — HYDRALAZINE HCL 20 MG/ML IJ SOLN
10.0000 mg | Freq: Four times a day (QID) | INTRAMUSCULAR | Status: DC | PRN
Start: 2013-10-05 — End: 2013-10-06

## 2013-10-05 MED ORDER — ASPIRIN 81 MG PO CHEW
324.0000 mg | CHEWABLE_TABLET | Freq: Once | ORAL | Status: AC
  Administered 2013-10-05: 162 mg via ORAL
  Filled 2013-10-05: qty 4

## 2013-10-05 MED ORDER — ACETAMINOPHEN 650 MG RE SUPP: 650.0000 mg | Freq: Four times a day (QID) | RECTAL | Status: DC | PRN

## 2013-10-05 MED ORDER — ASPIRIN EC 81 MG PO TBEC
81.0000 mg | DELAYED_RELEASE_TABLET | Freq: Every day | ORAL | Status: DC
  Administered 2013-10-06: 81 mg via ORAL
  Filled 2013-10-05: qty 1

## 2013-10-05 MED ORDER — NITROGLYCERIN 0.4 MG SL SUBL
0.4000 mg | SUBLINGUAL_TABLET | SUBLINGUAL | Status: AC | PRN
  Administered 2013-10-05 – 2013-10-06 (×3): 0.4 mg via SUBLINGUAL
  Filled 2013-10-05: qty 25

## 2013-10-05 MED ORDER — NAPROXEN 250 MG PO TABS
250.0000 mg | ORAL_TABLET | Freq: Two times a day (BID) | ORAL | Status: DC | PRN
  Filled 2013-10-05: qty 1

## 2013-10-05 MED ORDER — PANTOPRAZOLE SODIUM 40 MG PO TBEC
40.0000 mg | DELAYED_RELEASE_TABLET | Freq: Every day | ORAL | Status: DC
  Administered 2013-10-06: 40 mg via ORAL
  Filled 2013-10-05: qty 1

## 2013-10-05 MED ORDER — ROPINIROLE HCL 1 MG PO TABS
1.0000 mg | ORAL_TABLET | Freq: Every day | ORAL | Status: DC
  Administered 2013-10-05: 1 mg via ORAL
  Filled 2013-10-05 (×3): qty 1

## 2013-10-05 MED ORDER — ACETAMINOPHEN 325 MG PO TABS: 650.0000 mg | ORAL_TABLET | Freq: Four times a day (QID) | ORAL | Status: DC | PRN

## 2013-10-05 MED ORDER — NAPROXEN SODIUM 220 MG PO TABS: 220.0000 mg | ORAL_TABLET | Freq: Two times a day (BID) | ORAL | Status: DC | PRN

## 2013-10-05 MED ORDER — ENOXAPARIN SODIUM 40 MG/0.4ML ~~LOC~~ SOLN: 40.0000 mg | SUBCUTANEOUS | Status: DC

## 2013-10-05 NOTE — H&P (Signed)
[facility] History and Physical  Cassandra Miles ZYS:[id number] DOB: 1941-09-21 DOA: 10/05/2013  Referring physician: WARD PCP: Nance Pear., NP   Chief Complaint: Chest pressure  HPI: Cassandra Miles is a 73 y.o. female  Presents with substernal chest pressure. Saw Dr. Liane Comber 1/7 who planned stress test for tomorrow. H/o PAF. No dyspnea. Felt light headed. No cough/F/C. EKG, troponin in ED ok.  Review of Systems:  sysems reviewed. As above. Otherwise negative.  Past Medical History  Diagnosis Date  . Restless leg syndrome   . Hypertension   . A-fib 2008    brief episode  . Palpitations   . Sleep apnea   . Depression   . Peptic ulcer disease   . GERD (gastroesophageal reflux disease)   . Barrett esophagus   . Avascular necrosis   . Osteoarthritis     knee  . Hepatic cyst   . Hyperlipidemia   . Blood transfusion without reported diagnosis     WITH  HIP REPLACEMENT 06,07  . Cataract   . Spinal stenosis    Past Surgical History  Procedure Laterality Date  . Hip fracture surgery      Bilateral hip replacements  . Orif wrist fracture  09/14/2011    Procedure: OPEN REDUCTION INTERNAL FIXATION (ORIF) WRIST FRACTURE;  Surgeon: Willa Frater III;  Location: [facility];  Service: Orthopedics;  Laterality: N/A;  . Cholecystectomy    . Tubal ligation    . Decompression core hip      bilateral hips  . Breast reduction surgery    . Colonoscopy     Social History:  reports that she has never smoked. She has never used smokeless tobacco. She reports that she drinks alcohol. She reports that she does not use illicit drugs.  Allergies  Allergen Reactions  . Bupropion Hcl Other (See Comments)    Made her feel bad  . Ciprofloxacin Nausea And Vomiting    REACTION: hives  . Penicillins Rash    REACTION: rash    Family History  Problem Relation Age of Onset  . Arthritis Father   . Stroke Mother     x2  . Heart disease Brother     Stenting   . Hyperlipidemia  Brother   . Melanoma Brother   . Colon cancer Paternal Aunt     x 2  . Diabetes Brother   . Heart disease Mother      Prior to Admission medications   Medication Sig Start Date End Date Taking? Authorizing Provider  aspirin EC 81 MG tablet Take 1 tablet (81 mg total) by mouth daily. 08/24/13  Yes Dorothy Spark, MD  metoprolol tartrate (LOPRESSOR) 25 MG tablet Take 1 tablet (25 mg total) by mouth 2 (two) times daily. 09/30/13  Yes Dorothy Spark, MD  Multiple Vitamin (MULTIVITAMIN) tablet Take 1 tablet by mouth daily.   Yes Historical Provider, MD  naproxen sodium (ANAPROX) 220 MG tablet Take 220 mg by mouth 2 (two) times daily as needed (arthritis pain).    Yes Historical Provider, MD  omeprazole (PRILOSEC) 40 MG capsule Take 40 mg by mouth daily as needed (gerd).   Yes Historical Provider, MD  rOPINIRole (REQUIP) 1 MG tablet take 1 tablet by mouth at bedtime 09/10/13  Yes Debbrah Alar, NP  sertraline (ZOLOFT) 100 MG tablet Take 100 mg by mouth daily.   Yes Historical Provider, MD   Physical Exam: Filed Vitals:   10/05/13 1600  BP: 149/66  Pulse:  57  Temp:   Resp: 14    BP 149/66  Pulse 57  Temp(Src) 98.1 F (36.7 C) (Oral)  Resp 14  SpO2 98%  BP 149/66  Pulse 57  Temp(Src) 98.1 F (36.7 C) (Oral)  Resp 14  Ht 5\' 7"  (1.702 m)  Wt 80.74 kg (178 lb)  BMI 27.87 kg/m2  SpO2 98%  General Appearance:    Alert, cooperative, no distress, appears stated age  Head:    Normocephalic, without obvious abnormality, atraumatic  Eyes:    PERRL, conjunctiva/corneas clear, EOM's intact, fundi    benign, both eyes     Nose:   Nares normal, septum midline, mucosa normal, no drainage    or sinus tenderness  Throat:   Lips, mucosa, and tongue normal; teeth and gums normal  Neck:   Supple, symmetrical, trachea midline, no adenopathy;    thyroid:  no enlargement/tenderness/nodules; no carotid   bruit or JVD  Back:     Symmetric, no curvature, ROM normal, no CVA tenderness   Lungs:     Clear to auscultation bilaterally, respirations unlabored  Chest Wall:    No tenderness or deformity   Heart:    Regular rate and rhythm, S1 and S2 normal, no murmur, rub   or gallop     Abdomen:     Soft, non-tender, bowel sounds active all four quadrants,    no masses, no organomegaly  Genitalia:   deferred  Rectal:   deferred  Extremities:   Extremities normal, atraumatic, no cyanosis or edema  Pulses:   2+ and symmetric all extremities  Skin:   Skin color, texture, turgor normal, no rashes or lesions  Lymph nodes:   Cervical, supraclavicular, and axillary nodes normal  Neurologic:   CNII-XII intact, normal strength, sensation and reflexes    throughout             Psych: normal affect.  Labs on Admission:  Basic Metabolic Panel:  Recent Labs Lab 10/05/13 1331  NA 144  K 3.8  CL 106  CO2 28  GLUCOSE 96  BUN 20  CREATININE 0.93  CALCIUM 8.7   Liver Function Tests: No results found for this basename: AST, ALT, ALKPHOS, BILITOT, PROT, ALBUMIN,  in the last 168 hours No results found for this basename: LIPASE, AMYLASE,  in the last 168 hours No results found for this basename: AMMONIA,  in the last 168 hours CBC:  Recent Labs Lab 10/05/13 1331  WBC 7.6  HGB 12.9  HCT 39.6  MCV 92.5  PLT 266   Cardiac Enzymes: No results found for this basename: CKTOTAL, CKMB, CKMBINDEX, TROPONINI,  in the last 168 hours  BNP (last 3 results)  Recent Labs  10/05/13 1332  PROBNP 181.6*   CBG: No results found for this basename: GLUCAP,  in the last 168 hours  Radiological Exams on Admission: Dg Chest 2 View  10/05/2013   CLINICAL DATA:  Chest pain  EXAM: CHEST  2 VIEW  COMPARISON:  None.  FINDINGS: The heart size and mediastinal contours are within normal limits. Both lungs are clear. The visualized skeletal structures are unremarkable.  IMPRESSION: No active cardiopulmonary disease.   Electronically Signed   By: Kathreen Devoid   On: 10/05/2013 13:20    EKG:  Normal sinus rhythm Left anterior fascicular block Moderate voltage criteria for LVH, may be normal variant  Assessment/Plan Principal Problem:   Chest pain Active Problems:   DEPRESSION   RESTLESS LEGS SYNDROME   Paroxysmal  atrial fibrillation   HTN (hypertension)   Hyperlipidemia  Observation, tele, continue asa. R/o MI Stress echo per Dr. Meda Coffee. NPO after midnight. Hold metoprolol for stress.  Time spent: [facility] Pager [phone]

## 2013-10-05 NOTE — ED Notes (Signed)
Pt c/o central cp described as pressure that started 2 days ago, pt states pain radiates to rt arm. Pt called pcp and was told to come to ED. Pt states she has hx of chest pressure and was supposed to get a stress test tomorrow. Pt rates pressure 5/10. Pt denies any n/v, sob, but does have some lightheadedness. Pt states she was watching a basketball game when sx started.

## 2013-10-05 NOTE — ED Notes (Signed)
Pt states for last 24 hour has been having chest pain and reports burning sensation in right arm.  Pt has had some shortness of breath, pain to upper shoulder.  Pt reports funny feelings in head over the last month.  Pt has history of afib

## 2013-10-05 NOTE — ED Provider Notes (Signed)
TIME SEEN: 1:04 PM  CHIEF COMPLAINT: Chest pain  HPI: Patient is a 73 year old female with a history of hypertension, hyperlipidemia, sleep apnea, A. fib not currently on Coumadin who presents the emergency department with intermittent chest pain for the past year. She reports over the past month her symptoms have been eating more frequent in over the past 48 hours she has noticed chest pain at rest. She describes it as a tightness that radiates into her neck. She's had some burning sensation in her right arm yesterday. It is associated with shortness of breath, lightheadedness. No diaphoresis, nausea or vomiting. She states it is worse with exertion. She denies any fever or cough. No lower extremity swelling or pain. No prior history of PE or DVT, recent prolonged immobilization such as hospitalization or long flight, surgery, fracture, exogenous hormone use or tobacco use. She does have a family history of a mother with stroke and brother with prior CABG.  Patient is scheduled for an outpatient stress test tomorrow. Her cardiologist is Dr. Meda Coffee. Her primary care physician is Debbrah Alar with [facility].  ROS: See HPI Constitutional: no fever  Eyes: no drainage  ENT: no runny nose   Cardiovascular:   chest pain  Resp: SOB  GI: no vomiting GU: no dysuria Integumentary: no rash  Allergy: no hives  Musculoskeletal: no leg swelling  Neurological: no slurred speech ROS otherwise negative  PAST MEDICAL HISTORY/PAST SURGICAL HISTORY:  Past Medical History  Diagnosis Date  . Restless leg syndrome   . Hypertension   . A-fib 2008    brief episode  . Palpitations   . Sleep apnea   . Depression   . Peptic ulcer disease   . GERD (gastroesophageal reflux disease)   . Barrett esophagus   . Avascular necrosis   . Osteoarthritis     knee  . Hepatic cyst   . Hyperlipidemia   . Blood transfusion without reported diagnosis     WITH  HIP REPLACEMENT 06,07  . Cataract   . Spinal stenosis      MEDICATIONS:  Prior to Admission medications   Medication Sig Start Date End Date Taking? Authorizing Provider  amLODipine (NORVASC) 5 MG tablet Take 1 tablet (5 mg total) by mouth 2 (two) times daily. 08/19/12   Burnice Logan, MD  aspirin EC 81 MG tablet Take 1 tablet (81 mg total) by mouth daily. 08/24/13   Dorothy Spark, MD  metoprolol tartrate (LOPRESSOR) 25 MG tablet Take 1 tablet (25 mg total) by mouth 2 (two) times daily. 09/30/13   Dorothy Spark, MD  Multiple Vitamin (MULTIVITAMIN) tablet Take 1 tablet by mouth daily.    Historical Provider, MD  naproxen sodium (ANAPROX) 220 MG tablet Take 220 mg by mouth 2 (two) times daily with a meal.    Historical Provider, MD  omeprazole (PRILOSEC) 40 MG capsule 1 caps bid x 4 weeks then reduce to 1 caps po qhs 10/07/12   Lafayette Dragon, MD  rOPINIRole (REQUIP) 1 MG tablet take 1 tablet by mouth at bedtime 09/10/13   Debbrah Alar, NP  sertraline (ZOLOFT) 100 MG tablet take 1 and 1/2 tablets by mouth once daily 05/16/13   Debbrah Alar, NP    ALLERGIES:  Allergies  Allergen Reactions  . Bupropion Hcl Other (See Comments)    Made her feel bad  . Ciprofloxacin Nausea And Vomiting    REACTION: hives  . Penicillins Rash    REACTION: rash    SOCIAL HISTORY:  History  Substance Use Topics  . Smoking status: Never Smoker   . Smokeless tobacco: Never Used  . Alcohol Use: Yes     Comment: SOCIAL    FAMILY HISTORY: Family History  Problem Relation Age of Onset  . Arthritis Father   . Stroke Mother     x2  . Heart disease Brother     Stenting   . Hyperlipidemia Brother   . Melanoma Brother   . Colon cancer Paternal Aunt     x 2  . Diabetes Brother   . Heart disease Mother     EXAM: BP 174/105  Pulse 64  Temp(Src) 97.3 F (36.3 C) (Oral)  Resp 18  SpO2 96% CONSTITUTIONAL: Alert and oriented and responds appropriately to questions. Well-appearing; well-nourished HEAD: Normocephalic EYES: Conjunctivae  clear, PERRL ENT: normal nose; no rhinorrhea; moist mucous membranes; pharynx without lesions noted NECK: Supple, no meningismus, no LAD  CARD: RRR; S1 and S2 appreciated; no murmurs, no clicks, no rubs, no gallops RESP: Normal chest excursion without splinting or tachypnea; breath sounds clear and equal bilaterally; no wheezes, no rhonchi, no rales,  ABD/GI: Normal bowel sounds; non-distended; soft, non-tender, no rebound, no guarding BACK:  The back appears normal and is non-tender to palpation, there is no CVA tenderness EXT: Normal ROM in all joints; non-tender to palpation; no edema; normal capillary refill; no cyanosis    SKIN: Normal color for age and race; warm NEURO: Moves all extremities equally PSYCH: The patient's mood and manner are appropriate. Grooming and personal hygiene are appropriate.  MEDICAL DECISION MAKING: Patient here with chest pain and shortness of breath. She is hypertensive but otherwise hemodynamically stable. Given her risk factors for ACS, will admit for rule out. We'll obtain cardiac labs, chest x-ray. EKG shows left anterior fascicular block that is unchanged, no new ischemic changes. Will give aspirin and nitroglycerin.  ED PROGRESS: Patient's labs are unremarkable. Troponin negative. Chest x-ray clear. Patient's chest pain has improved with nitroglycerin and she is now chest pain-free. Will discuss with hospitalist for admission.   EKG Interpretation    Date/Time:  Monday October 05 2013 12:42:35 EST Ventricular Rate:  65 PR Interval:  180 QRS Duration: 88 QT Interval:  410 QTC Calculation: 426 R Axis:   -50 Text Interpretation:  Normal sinus rhythm Left anterior fascicular block Moderate voltage criteria for LVH, may be normal variant Abnormal ECG No significant change since last tracing Confirmed by Aubry Tucholski  DO, Kortlyn Koltz ([id number]) on 10/05/2013 1:04:43 PM              Scott, DO 10/05/13 1440

## 2013-10-05 NOTE — Telephone Encounter (Signed)
Patient Information:  Caller Name: Andreah  Phone: [phone]  Patient: Cassandra Miles, Cassandra Miles  Gender: Female  DOB: 15-Dec-1940  Age: 73 Years  PCP: Debbrah Alar (Adults only)  Office Follow Up:  Does the office need to follow up with this patient?: No  Instructions For The Office: N/A   Symptoms  Reason For Call & Symptoms: Pt is having more chest tightness since 10/04/13.  Also last night she had a burning feeling from her right inner arm pit down to her hand. She is having a stress test tomorrow 10/06/13.  Reviewed Health History In EMR: Yes  Reviewed Medications In EMR: Yes  Reviewed Allergies In EMR: Yes  Reviewed Surgeries / Procedures: Yes  Date of Onset of Symptoms: 10/04/2013  Guideline(s) Used:  Chest Pain  Disposition Per Guideline:   Call EMS 911 Now  Reason For Disposition Reached:   Chest pain lasting longer than 5 minutes and ANY of the following:  Over 60 years old Over 18 years old and at least one cardiac risk factor (i.e., high blood pressure, diabetes, high cholesterol, obesity, smoker or strong family history of heart disease) Pain is crushing, pressure-like, or heavy  Took nitroglycerin and chest pain was not relieved History of heart disease (i.e., angina, heart attack, bypass surgery, angioplasty, CHF)  Advice Given:  N/A  Patient Will Follow Care Advice:  YES,  But she may get her friend to take her to the ED.

## 2013-10-06 ENCOUNTER — Encounter (HOSPITAL_COMMUNITY): Payer: Medicare Other

## 2013-10-06 DIAGNOSIS — R072 Precordial pain: Secondary | ICD-10-CM

## 2013-10-06 DIAGNOSIS — K219 Gastro-esophageal reflux disease without esophagitis: Secondary | ICD-10-CM | POA: Diagnosis present

## 2013-10-06 LAB — TROPONIN I: Troponin I: <0.3 ng/mL (ref <0.30)

## 2013-10-06 LAB — FOLATE RBC: RBC Folate: 643 ng/mL — ABNORMAL HIGH (ref >280)

## 2013-10-06 MED ORDER — ACETAMINOPHEN 325 MG PO TABS: 650.0000 mg | ORAL_TABLET | Freq: Four times a day (QID) | ORAL | Status: DC | PRN

## 2013-10-06 MED ORDER — OMEPRAZOLE 40 MG PO CPDR: 40.0000 mg | DELAYED_RELEASE_CAPSULE | Freq: Every day | ORAL | Status: DC

## 2013-10-06 MED ORDER — CYANOCOBALAMIN 1000 MCG/ML IJ SOLN
1000.0000 ug | Freq: Once | INTRAMUSCULAR | Status: AC
  Administered 2013-10-06: 1000 ug via INTRAMUSCULAR
  Filled 2013-10-06: qty 1

## 2013-10-06 NOTE — Progress Notes (Signed)
UR completed 

## 2013-10-06 NOTE — Progress Notes (Signed)
Dr. Conley Canal made aware of chest pressure this am. No new orders, will continue to monitor and plan as ordered

## 2013-10-06 NOTE — Progress Notes (Signed)
GXT Stress Echo performed without immediate complication.

## 2013-10-06 NOTE — Discharge Summary (Signed)
Physician Discharge Summary  Cassandra Miles DGU:[id number] DOB: 05-10-1941 DOA: 10/05/2013  PCP: Nance Pear., NP  Admit date: 10/05/2013 Discharge date: 10/06/2013  Discharge Diagnoses:  Principal Problem:   Chest pain Active Problems:   DEPRESSION   RESTLESS LEGS SYNDROME   Paroxysmal atrial fibrillation   HTN (hypertension)   Hyperlipidemia   Discharge Condition: stable  Filed Weights   10/05/13 1600  Weight: 80.74 kg (178 lb)    History of present illness:  73 y.o. female  Presents with substernal chest pressure. Saw Dr. Liane Comber 1/7 who planned stress test for tomorrow. H/o PAF. No dyspnea. Felt light headed. No cough/F/C. EKG, troponin in ED ok.  Hospital Course:  Observed on telemetry.  MI ruled out.  Stress echo done as inpatient normal.  CP likely GI.  Consultations:  none  Discharge Exam: Filed Vitals:   10/06/13 1302  BP: 134/68  Pulse: 67  Temp: 97.6 F (36.4 C)  Resp: 18    General: comfortable Cardiovascular: RRR Respiratory: CTA  Discharge Instructions  Discharge Orders   Future Orders Complete By Expires   Activity as tolerated - No restrictions  As directed    Diet - low sodium heart healthy  As directed        Medication List         acetaminophen 325 MG tablet  Commonly known as:  TYLENOL  Take 2 tablets (650 mg total) by mouth every 6 (six) hours as needed for mild pain (or Fever >/= 101).     aspirin EC 81 MG tablet  Take 1 tablet (81 mg total) by mouth daily.     metoprolol tartrate 25 MG tablet  Commonly known as:  LOPRESSOR  Take 1 tablet (25 mg total) by mouth 2 (two) times daily.     multivitamin tablet  Take 1 tablet by mouth daily.     naproxen sodium 220 MG tablet  Commonly known as:  ANAPROX  Take 220 mg by mouth 2 (two) times daily as needed (arthritis pain).     omeprazole 40 MG capsule  Commonly known as:  PRILOSEC  Take 1 capsule (40 mg total) by mouth daily.     rOPINIRole 1 MG tablet   Commonly known as:  REQUIP  take 1 tablet by mouth at bedtime     sertraline 100 MG tablet  Commonly known as:  ZOLOFT  Take 100 mg by mouth daily.       Allergies  Allergen Reactions  . Bupropion Hcl Other (See Comments)    Made her feel bad  . Ciprofloxacin Nausea And Vomiting    REACTION: hives  . Penicillins Rash    REACTION: rash       Follow-up Information   Follow up with Nance Pear., NP. (If symptoms worsen)    Specialty:  Internal Medicine   Contact information:   [place] [address] [phone]        The results of significant diagnostics from this hospitalization (including imaging, microbiology, ancillary and laboratory) are listed below for reference.    Significant Diagnostic Studies: Dg Chest 2 View  10/05/2013   CLINICAL DATA:  Chest pain  EXAM: CHEST  2 VIEW  COMPARISON:  None.  FINDINGS: The heart size and mediastinal contours are within normal limits. Both lungs are clear. The visualized skeletal structures are unremarkable.  IMPRESSION: No active cardiopulmonary disease.   Electronically Signed   By: Kathreen Devoid   On: 10/05/2013 13:20  EKG Normal sinus rhythm Left axis deviation Left anterior fasicular block  Stress echo - Stress ECG conclusions: There were no stress arrhythmias or conduction abnormalities. The stress ECG was normal. - Staged echo: There was no echocardiographic evidence for stress-induced ischemia. Impressions:  - Stress echo with no chest pain, no ST changes and no stress induced wall motion abnormalities.  Microbiology: No results found for this or any previous visit (from the past 240 hour(s)).   Labs: Basic Metabolic Panel:  Recent Labs Lab 10/05/13 1331  NA 144  K 3.8  CL 106  CO2 28  GLUCOSE 96  BUN 20  CREATININE 0.93  CALCIUM 8.7   Liver Function Tests: No results found for this basename: AST, ALT, ALKPHOS, BILITOT, PROT, ALBUMIN,  in the last 168 hours No  results found for this basename: LIPASE, AMYLASE,  in the last 168 hours No results found for this basename: AMMONIA,  in the last 168 hours CBC:  Recent Labs Lab 10/05/13 1331  WBC 7.6  HGB 12.9  HCT 39.6  MCV 92.5  PLT 266   Cardiac Enzymes:  Recent Labs Lab 10/05/13 1624 10/05/13 2200 10/06/13 0314  TROPONINI <0.30 <0.30 <0.30   BNP: BNP (last 3 results)  Recent Labs  10/05/13 1332  PROBNP 181.6*   CBG: No results found for this basename: GLUCAP,  in the last 168 hours     Signed:  Edgar L  [facility] 10/06/2013, 3:39 PM

## 2013-10-06 NOTE — Progress Notes (Signed)
Echocardiogram Echocardiogram Stress Test has been performed.  Joelene Millin 10/06/2013, 10:56 AM

## 2013-10-06 NOTE — Progress Notes (Signed)
Upon assessment this am, pt complaining of chest pressure this am. She says it comes and goes but wasn't going away this morning ans continues to get worse. Bp 152/80, Sinus in the 60s. EKG being done. After first sl nitro given, bp down to 130/70 and still sinus in the 70s with relief from pressure. Will continue to monitor

## 2013-10-16 ENCOUNTER — Other Ambulatory Visit: Payer: Self-pay | Admitting: Family

## 2013-10-28 ENCOUNTER — Other Ambulatory Visit: Payer: Self-pay | Admitting: *Deleted

## 2013-10-28 MED ORDER — OMEPRAZOLE 40 MG PO CPDR: 40.0000 mg | DELAYED_RELEASE_CAPSULE | Freq: Every day | ORAL | Status: DC

## 2014-01-11 ENCOUNTER — Other Ambulatory Visit: Payer: Self-pay | Admitting: Family

## 2014-01-11 NOTE — Telephone Encounter (Signed)
Pt last seen by Cassandra Miles in October for acute concerns and has no future appts on record.  Please advise re: refill and follow up.  Medication name:  Name from pharmacy:  rOPINIRole (REQUIP) 1 MG tablet  ROPINIROLE HCL 1 MG TABLET Sig: TAKE 1 TABLET BY MOUTH AT BEDTIME Dispense: 30 tablet (Pharmacy requested 30) Refills: 2 Start: 01/11/2014 Class: Normal Requested on: 10/16/2013 Originally ordered on: 08/13/2011 Last refill: 12/14/2013

## 2014-01-11 NOTE — Telephone Encounter (Signed)
Ok to send 30 tabs no refills. Needs OV please.

## 2014-01-12 NOTE — Telephone Encounter (Signed)
Rx called to pharmacy voicemail as below.  Please call pt to arrange follow up.

## 2014-01-13 NOTE — Telephone Encounter (Signed)
Left message for patient to return my call.

## 2014-01-18 NOTE — Telephone Encounter (Signed)
Left message for patient to return my call.

## 2014-01-21 NOTE — Telephone Encounter (Signed)
Left message for patient to return my call.

## 2014-01-21 NOTE — Telephone Encounter (Signed)
Appointment scheduled for 01/22/14

## 2014-01-22 ENCOUNTER — Ambulatory Visit (INDEPENDENT_AMBULATORY_CARE_PROVIDER_SITE_OTHER): Payer: Medicare Other | Admitting: Family

## 2014-01-22 ENCOUNTER — Encounter: Payer: Self-pay | Admitting: Family

## 2014-01-22 VITALS — BP 136/94 | HR 60 | Temp 97.4°F | Resp 16 | Ht 67.0 in | Wt 183.0 lb

## 2014-01-22 DIAGNOSIS — E785 Hyperlipidemia, unspecified: Secondary | ICD-10-CM

## 2014-01-22 DIAGNOSIS — M545 Low back pain, unspecified: Secondary | ICD-10-CM

## 2014-01-22 DIAGNOSIS — I10 Essential (primary) hypertension: Secondary | ICD-10-CM

## 2014-01-22 DIAGNOSIS — G2581 Restless legs syndrome: Secondary | ICD-10-CM

## 2014-01-22 NOTE — Progress Notes (Signed)
Subjective:    Patient ID: Cassandra Miles, female    DOB: 1941/05/15, 73 y.o.   MRN: [id number]  HPI  Cassandra Miles is a 73 yr old female who presents today for follow up of multiple medical problems:  1) HTN- currently maintained on metoprolol.  BP Readings from Last 3 Encounters:  01/22/14 136/94  10/06/13 134/68  09/30/13 150/92    2) Hyperlipidemia- Not currently maintained on statin.  Cholesterol was only mildly elevated.   Lab Results  Component Value Date   CHOL 203* 02/25/2013   HDL 75 02/25/2013   LDLCALC 115* 02/25/2013   LDLDIRECT 167.6 10/18/2010   TRIG 67 02/25/2013   CHOLHDL 2.7 02/25/2013   3) RLS- She is currently maintained on Requip. Notes that legs "give out" at random times.  Reports that she feels "sick" x 1 hour. Has been taking requip  "for a long time though." Reports that she can have legs give out unexpectedly. Does not fall.  Reports low back pain.  Denies bowel/bladder incontinence.     Review of Systems See HPI  Past Medical History  Diagnosis Date  . Restless leg syndrome   . Hypertension   . A-fib 2008    brief episode  . Palpitations   . Sleep apnea   . Depression   . Peptic ulcer disease   . GERD (gastroesophageal reflux disease)   . Barrett esophagus   . Avascular necrosis   . Osteoarthritis     knee  . Hepatic cyst   . Hyperlipidemia   . Blood transfusion without reported diagnosis     WITH  HIP REPLACEMENT 06,07  . Cataract   . Spinal stenosis     History   Social History  . Marital Status: Divorced    Spouse Name: N/A    Number of Children: N/A  . Years of Education: N/A   Occupational History  . retired     Pharmacist, hospital   Social History Main Topics  . Smoking status: Never Smoker   . Smokeless tobacco: Never Used  . Alcohol Use: Yes     Comment: SOCIAL  . Drug Use: No  . Sexual Activity: Not on file   Other Topics Concern  . Not on file   Social History Narrative   Moved from [place], [place]    Past Surgical  History  Procedure Laterality Date  . Hip fracture surgery      Bilateral hip replacements  . Orif wrist fracture  09/14/2011    Procedure: OPEN REDUCTION INTERNAL FIXATION (ORIF) WRIST FRACTURE;  Surgeon: Willa Frater III;  Location: [facility];  Service: Orthopedics;  Laterality: N/A;  . Cholecystectomy    . Tubal ligation    . Decompression core hip      bilateral hips  . Breast reduction surgery    . Colonoscopy      Family History  Problem Relation Age of Onset  . Arthritis Father   . Stroke Mother     x2  . Heart disease Brother     Stenting   . Hyperlipidemia Brother   . Melanoma Brother   . Colon cancer Paternal Aunt     x 2  . Diabetes Brother   . Heart disease Mother     Allergies  Allergen Reactions  . Bupropion Hcl Other (See Comments)    Made her feel bad  . Ciprofloxacin Nausea And Vomiting    REACTION: hives  . Penicillins Rash    REACTION:  rash    Current Outpatient Prescriptions on File Prior to Visit  Medication Sig Dispense Refill  . acetaminophen (TYLENOL) 325 MG tablet Take 2 tablets (650 mg total) by mouth every 6 (six) hours as needed for mild pain (or Fever >/= 101).      Marland Kitchen aspirin EC 81 MG tablet Take 1 tablet (81 mg total) by mouth daily.  30 tablet  3  . metoprolol tartrate (LOPRESSOR) 25 MG tablet Take 1 tablet (25 mg total) by mouth 2 (two) times daily.  30 tablet  3  . Multiple Vitamin (MULTIVITAMIN) tablet Take 1 tablet by mouth daily.      . naproxen sodium (ANAPROX) 220 MG tablet Take 220 mg by mouth 2 (two) times daily as needed (arthritis pain).       Marland Kitchen rOPINIRole (REQUIP) 1 MG tablet TAKE 1 TABLET BY MOUTH AT BEDTIME  30 tablet  0  . sertraline (ZOLOFT) 100 MG tablet Take 100 mg by mouth daily.       No current facility-administered medications on file prior to visit.    BP 136/94  Pulse 60  Temp(Src) 97.4 F (36.3 C) (Oral)  Resp 16  Ht 5\' 7"  (1.702 m)  Wt 183 lb 0.6 oz (83.026 kg)  BMI 28.66 kg/m2  SpO2 98%         Objective:   Physical Exam  Constitutional: She is oriented to person, place, and time. She appears well-developed and well-nourished. No distress.  HENT:  Head: Normocephalic and atraumatic.  Cardiovascular: Normal rate and regular rhythm.   No murmur heard. Pulmonary/Chest: Effort normal and breath sounds normal. No respiratory distress. She has no wheezes. She has no rales. She exhibits no tenderness.  Musculoskeletal: She exhibits no edema.  Neurological: She is alert and oriented to person, place, and time.  Reflex Scores:      Patellar reflexes are 2+ on the right side and 2+ on the left side. Bilateral LE strength is 5/5.          Assessment & Plan:

## 2014-01-22 NOTE — Assessment & Plan Note (Signed)
BP is stable on metoprolol. Continue same.

## 2014-01-22 NOTE — Assessment & Plan Note (Signed)
Pt with low back pain and intermittent LE weakness. Will obtain MRI of the LE to further evaluate.

## 2014-01-22 NOTE — Patient Instructions (Signed)
Try cutting requip in half and see if you can better tolerate this dose. You will be contacted about your MRI. Follow up in 3 months, sooner if problem/concerns.

## 2014-01-22 NOTE — Assessment & Plan Note (Signed)
Mild elevated.  Monitor with diet.

## 2014-01-22 NOTE — Progress Notes (Signed)
Pre visit review using our clinic review tool, if applicable. No additional management support is needed unless otherwise documented below in the visit note. 

## 2014-01-22 NOTE — Assessment & Plan Note (Signed)
We discussed other options for RLS such as sinemet or Benzo. She wishes to continue requip. Instructed her to try to cut requip tab in half and see if she tolerates better.

## 2014-01-25 ENCOUNTER — Telehealth: Payer: Self-pay | Admitting: Family

## 2014-01-25 NOTE — Telephone Encounter (Signed)
Relevant patient education assigned to patient using Emmi. ° °

## 2014-01-27 ENCOUNTER — Telehealth: Payer: Self-pay | Admitting: Family

## 2014-01-27 DIAGNOSIS — M519 Unspecified thoracic, thoracolumbar and lumbosacral intervertebral disc disorder: Secondary | ICD-10-CM

## 2014-01-27 NOTE — Telephone Encounter (Signed)
Left message for pt to return my call.

## 2014-01-27 NOTE — Telephone Encounter (Signed)
Message copied by Debbrah Alar on Wed Jan 27, 2014 11:31 AM ------      Message from: Synthia Innocent      Created: Wed Jan 27, 2014  8:52 AM       Pt will need current BUN and Creat prior to MRI, MRI is sch for Saturday. Can you please place order ------

## 2014-01-27 NOTE — Telephone Encounter (Signed)
Could you please contact pt for bmet?

## 2014-01-30 ENCOUNTER — Ambulatory Visit (HOSPITAL_BASED_OUTPATIENT_CLINIC_OR_DEPARTMENT_OTHER)
Admission: RE | Admit: 2014-01-30 | Discharge: 2014-01-30 | Disposition: A | Discharge status: Home / Self Care | Payer: Medicare Other | Source: Ambulatory Visit | Attending: Family | Admitting: Family

## 2014-01-30 DIAGNOSIS — M545 Low back pain, unspecified: Secondary | ICD-10-CM

## 2014-01-30 DIAGNOSIS — M6281 Muscle weakness (generalized): Secondary | ICD-10-CM | POA: Insufficient documentation

## 2014-01-30 DIAGNOSIS — M5126 Other intervertebral disc displacement, lumbar region: Secondary | ICD-10-CM | POA: Insufficient documentation

## 2014-01-30 DIAGNOSIS — M47817 Spondylosis without myelopathy or radiculopathy, lumbosacral region: Secondary | ICD-10-CM | POA: Insufficient documentation

## 2014-01-30 NOTE — Telephone Encounter (Signed)
Also, when you speak with pt, please let her know that I reviewed her MRI and it notes some bulging discs in her lumbar spine as well as some narrowing of the spinal cord in this area.  I would like for her to meet with a neurosurgeon for consultation.  Pended.

## 2014-02-01 NOTE — Telephone Encounter (Signed)
Attempted to reach pt No answer, no voicemail 

## 2014-02-03 NOTE — Telephone Encounter (Signed)
Left message to return my call.  

## 2014-02-03 NOTE — Telephone Encounter (Signed)
Notified pt and she is agreeable to proceed with referral. 

## 2014-02-12 ENCOUNTER — Telehealth: Payer: Self-pay | Admitting: *Deleted

## 2014-02-12 ENCOUNTER — Other Ambulatory Visit: Payer: Self-pay | Admitting: Family

## 2014-02-12 NOTE — Telephone Encounter (Signed)
Pt left message on voicemail that she saw her MRI result on mychart and it mentions liver lesions. Pt wanted to know if you think this need further workup? Should she be concerned?

## 2014-02-12 NOTE — Telephone Encounter (Signed)
Radiologist thinks that these are cysts.  They were stable compared to CT 2014.  I do not think she should be concerned and I do not think that this warrants further work up.

## 2014-02-12 NOTE — Telephone Encounter (Signed)
Left detailed message on home # at pt's request re: below recommendation and to call if any further concerns.

## 2014-03-08 ENCOUNTER — Other Ambulatory Visit: Payer: Self-pay | Admitting: Internal Medicine

## 2014-05-12 ENCOUNTER — Other Ambulatory Visit: Payer: Self-pay | Admitting: Family

## 2014-05-18 ENCOUNTER — Other Ambulatory Visit: Payer: Self-pay | Admitting: Cardiology

## 2014-06-24 ENCOUNTER — Ambulatory Visit (INDEPENDENT_AMBULATORY_CARE_PROVIDER_SITE_OTHER): Payer: Medicare Other | Admitting: Nurse Practitioner

## 2014-06-24 ENCOUNTER — Encounter: Payer: Self-pay | Admitting: Nurse Practitioner

## 2014-06-24 VITALS — BP 162/102 | HR 68 | Ht 67.0 in | Wt 192.0 lb

## 2014-06-24 DIAGNOSIS — Z9119 Patient's noncompliance with other medical treatment and regimen: Secondary | ICD-10-CM

## 2014-06-24 DIAGNOSIS — Z91199 Patient's noncompliance with other medical treatment and regimen due to unspecified reason: Secondary | ICD-10-CM

## 2014-06-24 DIAGNOSIS — I10 Essential (primary) hypertension: Secondary | ICD-10-CM

## 2014-06-24 MED ORDER — METOPROLOL SUCCINATE ER 50 MG PO TB24: 50.0000 mg | ORAL_TABLET | Freq: Every day | ORAL | Status: DC

## 2014-06-24 NOTE — Patient Instructions (Signed)
Your physician has recommended you make the following change in your medication: Stop metoprolol tartrate 1 tablet twice a day  2. Start Toprol XL 50 MG 1 tablet daily  Your physician wants you to follow-up in: 6 months with Dr Johann Capers will receive a reminder letter in the mail two months in advance. If you don't receive a letter, please call our office to schedule the follow-up appointment.

## 2014-06-24 NOTE — Progress Notes (Signed)
Patient Name: Cassandra Miles Date of Encounter: 06/24/2014  Primary Care Provider:  Nance Pear., NP Primary Cardiologist:  Liane Comber, MD   Patient Profile  73 y/o female with a h/o PAF and atypical chest pain who presents for f/u.  Problem List   Past Medical History  Diagnosis Date  . Restless leg syndrome   . Hypertension   . PAF (paroxysmal atrial fibrillation)     a. 1 documented episode in 2008.  Marland Kitchen Palpitations     a. 08/2013 event monitor: no events but was noncompliant;  b. metoprolol added 09/2013.  Marland Kitchen Sleep apnea   . Depression   . Peptic ulcer disease   . GERD (gastroesophageal reflux disease)   . Barrett esophagus   . Avascular necrosis   . Osteoarthritis     a. knee  . Hepatic cyst   . Hyperlipidemia   . Blood transfusion without reported diagnosis     WITH  HIP REPLACEMENT 06,07  . Cataract   . Spinal stenosis   . Atypical chest pain     a. 09/2013 Admitted -> Negative stress echo, EF 60%, no rwma.  . Noncompliance    Past Surgical History  Procedure Laterality Date  . Hip fracture surgery      Bilateral hip replacements  . Orif wrist fracture  09/14/2011    Procedure: OPEN REDUCTION INTERNAL FIXATION (ORIF) WRIST FRACTURE;  Surgeon: Willa Frater III;  Location: [facility];  Service: Orthopedics;  Laterality: N/A;  . Cholecystectomy    . Tubal ligation    . Decompression core hip      bilateral hips  . Breast reduction surgery    . Colonoscopy      Allergies  Allergies  Allergen Reactions  . Bupropion Hcl Other (See Comments)    Made her feel bad  . Ciprofloxacin Nausea And Vomiting    REACTION: hives  . Penicillins Rash    REACTION: rash    HPI  73 y/o female with the above problem list.  She was last seen in clinic in January at which time she was having a fair amt of palpitations.  She was previously ordered an event monitor but decided not to wear it.  As a result, a 48 holter was ordered to document her rhythm during  palpitations, as she does have a h/o afib.  Unfortunately, she had c/p and was admitted to the hospital within a few days of her office visit.  She r/o and had a nl stress echo.  There is not report of arrhythmias from her hospitalization.  Since then, she says that her burden of palpitations is much less and in fact, they don't really bother her.  She may have an episode of palpitations about 3x/month, lasting ~ 91mins w/o assoc Ss, and resolving spontaneously.   She admits to relative noncompliance today.  She only takes her metoprolol on days that she notices that her BP is high and even on those days, she'll often miss the evening dose.  When she's taking her metoprolol regularly, she says that her BP often runs 125/80 but when she doesn't take it, she's seen pressures of 190/100.  She says that she snores quite a bit and when questioned, says that she does have a h/o OSA and has a CPAP in her closet but prefers not to use it.  She has chronic DOE with inclines but otherwise is very active @ home w/o significant limitations.  She denies chest pain, pnd,  orthopnea, n, v, dizziness, syncope, edema, weight gain, or early satiety.   Home Medications  Prior to Admission medications   Medication Sig Start Date End Date Taking? Authorizing Provider  acetaminophen (TYLENOL) 325 MG tablet Take 2 tablets (650 mg total) by mouth every 6 (six) hours as needed for mild pain (or Fever >/= 101). 10/06/13  Yes Delfina Redwood, MD  aspirin EC 81 MG tablet Take 1 tablet (81 mg total) by mouth daily. 08/24/13  Yes Dorothy Spark, MD  naproxen sodium (ANAPROX) 220 MG tablet Take 220 mg by mouth 2 (two) times daily as needed (arthritis pain).    Yes Historical Provider, MD  omeprazole (PRILOSEC) 40 MG capsule Take 40 mg by mouth daily as needed. 10/28/13  Yes Lafayette Dragon, MD  rOPINIRole (REQUIP) 1 MG tablet take 1 tablet by mouth at bedtime 05/12/14  Yes Debbrah Alar, NP  sertraline (ZOLOFT) 100 MG tablet take 1  and 1/2 tablet by mouth once daily 03/08/14  Yes Brunetta Jeans, PA-C  Cholecalciferol (VITAMIN D PO) Take 1 tablet by mouth daily.    Historical Provider, MD  MAGNESIUM PO Take 1 tablet by mouth daily.    Historical Provider, MD  metoprolol succinate (TOPROL-XL) 50 MG 24 hr tablet Take 1 tablet (50 mg total) by mouth daily. Take with or immediately following a meal. 06/24/14   Rogelia Mire, NP  Multiple Vitamin (MULTIVITAMIN) tablet Take 1 tablet by mouth daily.    Historical Provider, MD    Review of Systems  As above, she reports noncompliance along with less frequent palpitations and chronic DOE with inclines.  All other systems reviewed and are otherwise negative except as noted above.  Physical Exam  Blood pressure 162/102, pulse 68, height 5\' 7"  (1.702 m), weight 192 lb (87.091 kg).  General: Pleasant, NAD Psych: Normal affect. Neuro: Alert and oriented X 3. Moves all extremities spontaneously. HEENT: Normal  Neck: Supple without bruits or JVD. Lungs:  Resp regular and unlabored, CTA. Heart: RRR no s3, s4, or murmurs. Abdomen: Soft, non-tender, non-distended, BS + x 4.  Extremities: No clubbing, cyanosis or edema. DP/PT/Radials 2+ and equal bilaterally.  Accessory Clinical Findings  ECG - RSR, 68, left axis, lafb.  Assessment & Plan  1.  Palpitations w/ h/o PAF:  Palpitations much improved, now occurring only 3x/ month.  She is not interested in wearing a monitor at this time.  She remains on bb therapy and we discussed the importance of compliance.  As she may be better served/more compliant with once daily dosing, I've switched her to toprol xl 50mg  daily.  2.  HTN:  BP elevated today but she hasn't taken her metoprolol this morning.  She says that when she is taking her meds as Rx, that her BP runs 125/80.  In an effort to improve her compliance, I have switched her from metoprolol tartrate to metoprolol succinate 50mg  bid.  I counseled her on the importance of  compliance and the potential long term complications of untreated HTN.  I've asked her to monitor her BP once daily over the next week to ensure that she is adequately treated.  She is to call if her systolic pressures are consistently >165mmHg.  If this is the case, I would add chlorthalidone 12.5mg  daily.  3.  OSA:  She is reluctant to wear CPAP b/c of the noise.  I advised that she may want to look into the nasal pillows.  She will return to her  Rx provider.  4.  PAF:  Documented in 2008.  As above, her palpitations are occurring much less frequently and she is not interested in wearing a monitor at this point.  5.  Atypical Chest Pain:  No further Ss.  Neg stress echo in January.  6.  Dispo:  Med changes as above.  She will call if BP's continue to trend high.  OTW, f/uwith Dr. Meda Coffee in 6 mos.   Murray Hodgkins, NP 06/24/2014, 3:36 PM

## 2014-08-07 ENCOUNTER — Other Ambulatory Visit: Payer: Self-pay | Admitting: Family

## 2014-08-09 NOTE — Telephone Encounter (Signed)
Pt was due for follow up in August and is past due.  Please call pt to arrange follow up with in 30 days.

## 2014-08-17 NOTE — Telephone Encounter (Signed)
Called pt left vm to call the office and schedule follow up

## 2014-08-18 NOTE — Telephone Encounter (Signed)
Letter mailed to pt.  

## 2014-09-05 ENCOUNTER — Telehealth: Payer: Self-pay | Admitting: Family

## 2014-09-06 NOTE — Telephone Encounter (Signed)
Appointment scheduled for 09/08/14

## 2014-09-06 NOTE — Telephone Encounter (Signed)
Refill sent but needs ov prior to additional refills.

## 2014-09-06 NOTE — Telephone Encounter (Signed)
30 day supply of Requip sent to pharmacy. Cassandra Miles wants to see her for follow up before further refills are due.  Please call pt to arrange appt.

## 2014-09-06 NOTE — Telephone Encounter (Addendum)
Medication Detail      Disp Refills Start End     rOPINIRole (REQUIP) 1 MG tablet 30 tablet 0 08/09/2014     Sig: TAKE 1 TABLET AT BEDTIME    Notes to Pharmacy: PATIENT NEEDS OFFICE VISIT FOR FURTHER REFILLS    E-Prescribing Status: Receipt confirmed by pharmacy (08/09/2014 8:51 AM EST)      LAST OV: 05.01.15 No future appointments scheduled; Please Advise on refills/SLS

## 2014-09-08 ENCOUNTER — Ambulatory Visit: Payer: Medicare Other | Admitting: Family

## 2014-09-08 MED ORDER — ROPINIROLE HCL 1 MG PO TABS: 1.0000 mg | ORAL_TABLET | Freq: Every day | ORAL | Status: DC

## 2014-09-08 NOTE — Telephone Encounter (Signed)
[facility] pharmacy, states they did not receive the rx, requesting that it is sent again for the 30 day supply.

## 2014-09-08 NOTE — Telephone Encounter (Signed)
Verified with pharmacist that they did not received eRx from 09/06/14 and they are unable to take verbal at this time. Asks that I call back and leave it on voicemail. Refill left on voicemail, #30 x no refills. Attempted to notify pt and left message on home # for her to return my call.

## 2014-09-28 ENCOUNTER — Encounter: Payer: Self-pay | Admitting: Medical

## 2014-09-28 ENCOUNTER — Ambulatory Visit (INDEPENDENT_AMBULATORY_CARE_PROVIDER_SITE_OTHER): Payer: Medicare Other | Admitting: Medical

## 2014-09-28 VITALS — BP 160/85 | HR 74 | Temp 97.8°F | Ht 67.0 in | Wt 193.6 lb

## 2014-09-28 DIAGNOSIS — G2581 Restless legs syndrome: Secondary | ICD-10-CM

## 2014-09-28 DIAGNOSIS — I10 Essential (primary) hypertension: Secondary | ICD-10-CM

## 2014-09-28 LAB — COMPREHENSIVE METABOLIC PANEL
ALT: 16 U/L (ref 0–35)
AST: 19 U/L (ref 0–37)
Albumin: 3.6 g/dL (ref 3.5–5.2)
Alkaline Phosphatase: 84 U/L (ref 39–117)
BUN: 19 mg/dL (ref 6–23)
CO2: 28 mEq/L (ref 19–32)
Calcium: 8.6 mg/dL (ref 8.4–10.5)
Chloride: 107 mEq/L (ref 96–112)
Creatinine, Ser: 0.9 mg/dL (ref 0.4–1.2)
GFR: 63.79 mL/min (ref >60.00)
Glucose, Bld: 102 mg/dL — ABNORMAL HIGH (ref 70–99)
Potassium: 3.8 mEq/L (ref 3.5–5.1)
Sodium: 140 mEq/L (ref 135–145)
Total Bilirubin: 0.6 mg/dL (ref 0.2–1.2)
Total Protein: 6.8 g/dL (ref 6.0–8.3)

## 2014-09-28 MED ORDER — ROPINIROLE HCL 1 MG PO TABS: 1.0000 mg | ORAL_TABLET | Freq: Every day | ORAL | Status: DC

## 2014-09-28 MED ORDER — LISINOPRIL 5 MG PO TABS: 5.0000 mg | ORAL_TABLET | Freq: Every day | ORAL | Status: DC

## 2014-09-28 NOTE — Progress Notes (Signed)
Pre visit review using our clinic review tool, if applicable. No additional management support is needed unless otherwise documented below in the visit note. 

## 2014-09-28 NOTE — Assessment & Plan Note (Signed)
For you htn, I am adding lisinopril 5mg  to your current metoprolol. Please get cmp today and schedule to see your cardiologist within 3 months.  Over next 2 weeks I want to see your at home bp reading come down less than 140/90. If not then want you to follow up with [place].

## 2014-09-28 NOTE — Progress Notes (Signed)
Subjective:    Patient ID: Cassandra Miles, female    DOB: January 06, 1941, 74 y.o.   MRN: [id number]  HPI   Pt in for refill of requip. Pt has been on the med for years. Pt has six tablets left. It does work. If not using medication then will have restless legs.  Pt is also on bp medication. Pt sees a cardiologist. Pt states cardiologist put her on the metoprolol. Pt has refill until 05-2015. Pt does have hx of paroxysmal atrial fibrillation. No recent episodes.  Pt when she checks her blood pressure almost daily. When she checks her diastolic average is about 83. Her systolic  level is about 413.    Past Medical History  Diagnosis Date  . Restless leg syndrome   . Hypertension   . PAF (paroxysmal atrial fibrillation)     a. 1 documented episode in 2008.  Marland Kitchen Palpitations     a. 08/2013 event monitor: no events but was noncompliant;  b. metoprolol added 09/2013.  Marland Kitchen Sleep apnea   . Depression   . Peptic ulcer disease   . GERD (gastroesophageal reflux disease)   . Barrett esophagus   . Avascular necrosis   . Osteoarthritis     a. knee  . Hepatic cyst   . Hyperlipidemia   . Blood transfusion without reported diagnosis     WITH  HIP REPLACEMENT 06,07  . Cataract   . Spinal stenosis   . Atypical chest pain     a. 09/2013 Admitted -> Negative stress echo, EF 60%, no rwma.  . Noncompliance     History   Social History  . Marital Status: Divorced    Spouse Name: N/A    Number of Children: N/A  . Years of Education: N/A   Occupational History  . retired     Pharmacist, hospital   Social History Main Topics  . Smoking status: Never Smoker   . Smokeless tobacco: Never Used  . Alcohol Use: Yes     Comment: SOCIAL  . Drug Use: No  . Sexual Activity: Not on file   Other Topics Concern  . Not on file   Social History Narrative   Moved from [place], [place]    Past Surgical History  Procedure Laterality Date  . Hip fracture surgery      Bilateral hip replacements  . Orif wrist fracture   09/14/2011    Procedure: OPEN REDUCTION INTERNAL FIXATION (ORIF) WRIST FRACTURE;  Surgeon: Willa Frater III;  Location: [facility];  Service: Orthopedics;  Laterality: N/A;  . Cholecystectomy    . Tubal ligation    . Decompression core hip      bilateral hips  . Breast reduction surgery    . Colonoscopy      Family History  Problem Relation Age of Onset  . Arthritis Father   . Stroke Mother     x2  . Heart disease Brother     Stenting   . Hyperlipidemia Brother   . Melanoma Brother   . Colon cancer Paternal Aunt     x 2  . Diabetes Brother   . Heart disease Mother     Allergies  Allergen Reactions  . Bupropion Hcl Other (See Comments)    Made her feel bad  . Ciprofloxacin Nausea And Vomiting    REACTION: hives  . Penicillins Rash    REACTION: rash    Current Outpatient Prescriptions on File Prior to Visit  Medication Sig Dispense Refill  .  Cholecalciferol (VITAMIN D PO) Take 1 tablet by mouth daily.    Marland Kitchen MAGNESIUM PO Take 1 tablet by mouth daily.    . metoprolol succinate (TOPROL-XL) 50 MG 24 hr tablet Take 1 tablet (50 mg total) by mouth daily. Take with or immediately following a meal. 90 tablet 3  . Multiple Vitamin (MULTIVITAMIN) tablet Take 1 tablet by mouth daily.    . naproxen sodium (ANAPROX) 220 MG tablet Take 220 mg by mouth 2 (two) times daily as needed (arthritis pain).     Marland Kitchen omeprazole (PRILOSEC) 40 MG capsule Take 40 mg by mouth daily as needed.    Marland Kitchen rOPINIRole (REQUIP) 1 MG tablet Take 1 tablet (1 mg total) by mouth at bedtime. 30 tablet 0  . sertraline (ZOLOFT) 100 MG tablet take 1 and 1/2 tablet by mouth once daily 45 tablet 4  . acetaminophen (TYLENOL) 325 MG tablet Take 2 tablets (650 mg total) by mouth every 6 (six) hours as needed for mild pain (or Fever >/= 101). (Patient not taking: Reported on 09/28/2014)    . aspirin EC 81 MG tablet Take 1 tablet (81 mg total) by mouth daily. (Patient not taking: Reported on 09/28/2014) 30 tablet 3   No current  facility-administered medications on file prior to visit.    BP 167/89 mmHg  Pulse 74  Temp(Src) 97.8 F (36.6 C) (Oral)  Ht 5\' 7"  (1.702 m)  Wt 193 lb 9.6 oz (87.816 kg)  BMI 30.31 kg/m2  SpO2 97%       Review of Systems  Constitutional: Negative for fever, chills, diaphoresis, activity change and fatigue.  Respiratory: Negative for cough, chest tightness and shortness of breath.   Cardiovascular: Negative for chest pain, palpitations and leg swelling.  Gastrointestinal: Negative for nausea, vomiting and abdominal pain.  Musculoskeletal: Negative for neck pain and neck stiffness.  Neurological: Negative for dizziness, tremors, seizures, syncope, facial asymmetry, speech difficulty, weakness, light-headedness, numbness and headaches.       Restless legs.  Psychiatric/Behavioral: Negative for behavioral problems, confusion and agitation. The patient is not nervous/anxious.        Objective:   Physical Exam   General Mental Status- Alert. General Appearance- Not in acute distress.   Skin General: Color- Normal Color. Moisture- Normal Moisture.  Neck Carotid Arteries- Normal color. Moisture- Normal Moisture. No carotid bruits. No JVD.  Chest and Lung Exam Auscultation: Breath Sounds:-Normal.  Cardiovascular Auscultation:Rythm- Regular. Murmurs & Other Heart Sounds:Auscultation of the heart reveals- No Murmurs.  Abdomen Inspection:-Inspeection Normal. Palpation/Percussion:Note:No mass. Palpation and Percussion of the abdomen reveal- Non Tender, Non Distended + BS, no rebound or guarding.    Neurologic Cranial Nerve exam:- CN III-XII intact(No nystagmus), symmetric smile. Drift Test:- No drift. Romberg Exam:- Negative.  Heal to Toe Gait exam:-Normal. Finger to Nose:- Normal/Intact Strength:- 5/5 equal and symmetric strength both upper and lower extremities.  Lower ext- normal pulses.        Assessment & Plan:

## 2014-09-28 NOTE — Assessment & Plan Note (Signed)
For your restless leg syndrome. Continue requip and I am prescribing refills

## 2014-09-28 NOTE — Patient Instructions (Signed)
For your restless leg syndrome. Continue requip and I am prescribing refills.  For you htn, I am adding lisinopril 5mg  to your current metoprolol. Please get cmp today and schedule to see your cardiologist within 3 months.  Over next 2 weeks I want to see your at home bp reading come down less than 140/90. If not then want you to follow up with [place].  Follow up 6 months with Melissa or her as needed.

## 2014-10-06 ENCOUNTER — Ambulatory Visit: Payer: Medicare Other | Admitting: Family

## 2014-12-20 ENCOUNTER — Other Ambulatory Visit: Payer: Self-pay | Admitting: Family

## 2014-12-20 MED ORDER — LISINOPRIL 5 MG PO TABS: 5.0000 mg | ORAL_TABLET | Freq: Every day | ORAL | Status: DC

## 2014-12-23 ENCOUNTER — Telehealth: Payer: Self-pay | Admitting: *Deleted

## 2014-12-23 MED ORDER — OMEPRAZOLE 40 MG PO CPDR: 40.0000 mg | DELAYED_RELEASE_CAPSULE | Freq: Every day | ORAL | Status: DC | PRN

## 2014-12-23 NOTE — Telephone Encounter (Signed)
Sent Rx for omeprazole (PRILOSEC), 40 mg, #30 with 3 refills to [facility] on 12/23/14.

## 2015-03-11 ENCOUNTER — Other Ambulatory Visit: Payer: Self-pay | Admitting: Family

## 2015-03-11 NOTE — Telephone Encounter (Signed)
Requesting Zoloft 100mg -Take 1 and 1/2 tablet by mouth once daily. Last refill:03-08-14;#45,4 Last OV:01-22-14 w/Melissa and 09-28-14 w/Edward for HTN Please advise.

## 2015-03-16 ENCOUNTER — Other Ambulatory Visit: Payer: Self-pay | Admitting: Family

## 2015-03-16 NOTE — Telephone Encounter (Signed)
Left message for patient to return my call.

## 2015-03-16 NOTE — Telephone Encounter (Signed)
Sent 30 day supply of lisinopril and sertraline to pharmacy. Pt is past due for follow up with PCP.  Please call pt to arrange appt. Soon as we cannot give further refills until she is seen in the office. Thanks!

## 2015-03-17 NOTE — Telephone Encounter (Signed)
Upon further review, saw that pt saw Percell Miller 09/2014 and advised 6 month f/u with Melissa. Mailed letter to pt to schedule appt on or around 03/29/15.

## 2015-03-21 ENCOUNTER — Other Ambulatory Visit: Payer: Self-pay

## 2015-03-30 ENCOUNTER — Other Ambulatory Visit: Payer: Self-pay

## 2015-03-30 ENCOUNTER — Other Ambulatory Visit: Payer: Self-pay | Admitting: Family

## 2015-03-30 NOTE — Telephone Encounter (Signed)
30 day supply sent, notified pt.

## 2015-03-30 NOTE — Telephone Encounter (Signed)
Patient is requesting a refill of this. She does have an appointment with Melissa on 7/12

## 2015-03-31 MED ORDER — ROPINIROLE HCL 1 MG PO TABS: 1.0000 mg | ORAL_TABLET | Freq: Every day | ORAL | Status: DC

## 2015-03-31 NOTE — Telephone Encounter (Signed)
Rx was sent on 03/16/15. See previous note.

## 2015-04-05 ENCOUNTER — Ambulatory Visit (INDEPENDENT_AMBULATORY_CARE_PROVIDER_SITE_OTHER): Payer: Medicare Other | Admitting: Family

## 2015-04-05 ENCOUNTER — Encounter: Payer: Self-pay | Admitting: Family

## 2015-04-05 VITALS — BP 120/76 | HR 67 | Temp 97.9°F | Resp 16 | Ht 67.0 in | Wt 192.8 lb

## 2015-04-05 DIAGNOSIS — F329 Major depressive disorder, single episode, unspecified: Secondary | ICD-10-CM

## 2015-04-05 DIAGNOSIS — K219 Gastro-esophageal reflux disease without esophagitis: Secondary | ICD-10-CM | POA: Diagnosis not present

## 2015-04-05 DIAGNOSIS — F32A Depression, unspecified: Secondary | ICD-10-CM

## 2015-04-05 DIAGNOSIS — I10 Essential (primary) hypertension: Secondary | ICD-10-CM | POA: Diagnosis not present

## 2015-04-05 DIAGNOSIS — G2581 Restless legs syndrome: Secondary | ICD-10-CM

## 2015-04-05 LAB — BASIC METABOLIC PANEL
BUN: 17 mg/dL (ref 6–23)
CALCIUM: 9 mg/dL (ref 8.4–10.5)
CO2: 28 meq/L (ref 19–32)
CREATININE: 1 mg/dL (ref 0.40–1.20)
Chloride: 106 mEq/L (ref 96–112)
GFR: 57.85 mL/min — ABNORMAL LOW (ref >60.00)
Glucose, Bld: 99 mg/dL (ref 70–99)
POTASSIUM: 4 meq/L (ref 3.5–5.1)
SODIUM: 141 meq/L (ref 135–145)

## 2015-04-05 MED ORDER — SERTRALINE HCL 100 MG PO TABS: 100.0000 mg | ORAL_TABLET | Freq: Every day | ORAL | Status: DC

## 2015-04-05 MED ORDER — LISINOPRIL 5 MG PO TABS
5.0000 mg | ORAL_TABLET | Freq: Every day | ORAL | Status: DC
Start: 2015-04-05 — End: 2015-06-06

## 2015-04-05 NOTE — Progress Notes (Signed)
Subjective:    Patient ID: Cassandra Miles, female    DOB: 1941/03/14, 74 y.o.   MRN: [id number]  HPI   Cassandra Miles is a 74 yr old female who presents today for follow up.  1) HTN- Patient is currently maintained on the following medications for blood pressure: toprol xl, lisinopril Patient reports good compliance with blood pressure medications. Patient denies chest pain, or swelling. She reports some SOB when she walks or does anything active. She reports that her DOE is the same as it was back in the fall when she met with cardiology.  Last 3 blood pressure readings in our office are as follows:   BP Readings from Last 3 Encounters:  04/05/15 120/76  09/28/14 160/85  06/24/14 162/102   2) Depression- maintained on zoloft. Reports that her depression is well controlled. Denies anxiety.   3) GERD- stable on PPI, notes worsening sxs with dietary indiscretion.   4) RLS- stable on requip  Review of Systems See HPI  Past Medical History  Diagnosis Date  . Restless leg syndrome   . Hypertension   . PAF (paroxysmal atrial fibrillation)     a. 1 documented episode in 2008.  Marland Kitchen Palpitations     a. 08/2013 event monitor: no events but was noncompliant;  b. metoprolol added 09/2013.  Marland Kitchen Sleep apnea   . Depression   . Peptic ulcer disease   . GERD (gastroesophageal reflux disease)   . Barrett esophagus   . Avascular necrosis   . Osteoarthritis     a. knee  . Hepatic cyst   . Hyperlipidemia   . Blood transfusion without reported diagnosis     WITH  HIP REPLACEMENT 06,07  . Cataract   . Spinal stenosis   . Atypical chest pain     a. 09/2013 Admitted -> Negative stress echo, EF 60%, no rwma.  . Noncompliance     History   Social History  . Marital Status: Divorced    Spouse Name: N/A  . Number of Children: N/A  . Years of Education: N/A   Occupational History  . retired     Pharmacist, hospital   Social History Main Topics  . Smoking status: Never Smoker   . Smokeless tobacco:  Never Used  . Alcohol Use: No  . Drug Use: No  . Sexual Activity: Not on file   Other Topics Concern  . Not on file   Social History Narrative   Moved from [place], [place]    Past Surgical History  Procedure Laterality Date  . Hip fracture surgery      Bilateral hip replacements  . Orif wrist fracture  09/14/2011    Procedure: OPEN REDUCTION INTERNAL FIXATION (ORIF) WRIST FRACTURE;  Surgeon: Willa Frater III;  Location: [facility];  Service: Orthopedics;  Laterality: N/A;  . Cholecystectomy    . Tubal ligation    . Decompression core hip      bilateral hips  . Breast reduction surgery    . Colonoscopy      Family History  Problem Relation Age of Onset  . Arthritis Father   . Stroke Mother     x2  . Heart disease Brother     Stenting   . Hyperlipidemia Brother   . Melanoma Brother   . Colon cancer Paternal Aunt     x 2  . Diabetes Brother   . Heart disease Mother     Allergies  Allergen Reactions  . Bupropion Hcl Other (  See Comments)    Made her feel bad  . Ciprofloxacin Nausea And Vomiting    REACTION: hives  . Penicillins Rash    REACTION: rash    Current Outpatient Prescriptions on File Prior to Visit  Medication Sig Dispense Refill  . acetaminophen (TYLENOL) 325 MG tablet Take 2 tablets (650 mg total) by mouth every 6 (six) hours as needed for mild pain (or Fever >/= 101).    Marland Kitchen aspirin EC 81 MG tablet Take 1 tablet (81 mg total) by mouth daily. 30 tablet 3  . MAGNESIUM PO Take 1 tablet by mouth daily.    . metoprolol succinate (TOPROL-XL) 50 MG 24 hr tablet Take 1 tablet (50 mg total) by mouth daily. Take with or immediately following a meal. 90 tablet 3  . Multiple Vitamin (MULTIVITAMIN) tablet Take 1 tablet by mouth daily.    . naproxen sodium (ANAPROX) 220 MG tablet Take 220 mg by mouth 2 (two) times daily as needed (arthritis pain).     Marland Kitchen omeprazole (PRILOSEC) 40 MG capsule Take 1 capsule (40 mg total) by mouth daily as needed. 30 capsule 3  .  rOPINIRole (REQUIP) 1 MG tablet Take 1 tablet (1 mg total) by mouth at bedtime. 30 tablet 3   No current facility-administered medications on file prior to visit.    BP 120/76 mmHg  Pulse 67  Temp(Src) 97.9 F (36.6 C) (Oral)  Resp 16  Ht _0  (1.702 m)  Wt 192 lb 12.8 oz (87.454 kg)  BMI 30.19 kg/m2  SpO2 97%       Objective:   Physical Exam  Constitutional: She appears well-developed and well-nourished.  Cardiovascular: Normal rate, regular rhythm and normal heart sounds.   No murmur heard. Pulmonary/Chest: Effort normal and breath sounds normal. No respiratory distress. She has no wheezes.  Psychiatric: She has a normal mood and affect. Her behavior is normal. Judgment and thought content normal.          Assessment & Plan:

## 2015-04-05 NOTE — Patient Instructions (Addendum)
Please complete lab work prior to leaving. Please schedule a Wellness visit at the front desk.   Gastroesophageal Reflux Disease, Adult Gastroesophageal reflux disease (GERD) happens when acid from your stomach flows up into the esophagus. When acid comes in contact with the esophagus, the acid causes soreness (inflammation) in the esophagus. Over time, GERD may create small holes (ulcers) in the lining of the esophagus. CAUSES   Increased body weight. This puts pressure on the stomach, making acid rise from the stomach into the esophagus.  Smoking. This increases acid production in the stomach.  Drinking alcohol. This causes decreased pressure in the lower esophageal sphincter (valve or ring of muscle between the esophagus and stomach), allowing acid from the stomach into the esophagus.  Late evening meals and a full stomach. This increases pressure and acid production in the stomach.  A malformed lower esophageal sphincter. Sometimes, no cause is found. SYMPTOMS   Burning pain in the lower part of the mid-chest behind the breastbone and in the mid-stomach area. This may occur twice a week or more often.  Trouble swallowing.  Sore throat.  Dry cough.  Asthma-like symptoms including chest tightness, shortness of breath, or wheezing. DIAGNOSIS  Your caregiver may be able to diagnose GERD based on your symptoms. In some cases, X-rays and other tests may be done to check for complications or to check the condition of your stomach and esophagus. TREATMENT  Your caregiver may recommend over-the-counter or prescription medicines to help decrease acid production. Ask your caregiver before starting or adding any new medicines.  HOME CARE INSTRUCTIONS   Change the factors that you can control. Ask your caregiver for guidance concerning weight loss, quitting smoking, and alcohol consumption.  Avoid foods and drinks that make your symptoms worse, such as:  Caffeine or alcoholic  drinks.  Chocolate.  Peppermint or mint flavorings.  Garlic and onions.  Spicy foods.  Citrus fruits, such as oranges, lemons, or limes.  Tomato-based foods such as sauce, chili, salsa, and pizza.  Fried and fatty foods.  Avoid lying down for the 3 hours prior to your bedtime or prior to taking a nap.  Eat small, frequent meals instead of large meals.  Wear loose-fitting clothing. Do not wear anything tight around your waist that causes pressure on your stomach.  Raise the head of your bed 6 to 8 inches with wood blocks to help you sleep. Extra pillows will not help.  Only take over-the-counter or prescription medicines for pain, discomfort, or fever as directed by your caregiver.  Do not take aspirin, ibuprofen, or other nonsteroidal anti-inflammatory drugs (NSAIDs). SEEK IMMEDIATE MEDICAL CARE IF:   You have pain in your arms, neck, jaw, teeth, or back.  Your pain increases or changes in intensity or duration.  You develop nausea, vomiting, or sweating (diaphoresis).  You develop shortness of breath, or you faint.  Your vomit is green, yellow, black, or looks like coffee grounds or blood.  Your stool is red, bloody, or black. These symptoms could be signs of other problems, such as heart disease, gastric bleeding, or esophageal bleeding. MAKE SURE YOU:   Understand these instructions.  Will watch your condition.  Will get help right away if you are not doing well or get worse. Document Released: 06/20/2005 Document Revised: 12/03/2011 Document Reviewed: 03/30/2011 [facility] Patient Information 2015 [place], [place]. This information is not intended to replace advice given to you by your health care provider. Make sure you discuss any questions you have with your health  care provider.  

## 2015-04-05 NOTE — Progress Notes (Signed)
Pre visit review using our clinic review tool, if applicable. No additional management support is needed unless otherwise documented below in the visit note. 

## 2015-04-08 NOTE — Assessment & Plan Note (Signed)
Stable, continue PPI

## 2015-04-08 NOTE — Assessment & Plan Note (Signed)
BP stable on current meds, continue same.  

## 2015-04-08 NOTE — Assessment & Plan Note (Signed)
Stable on requip. Continue same.

## 2015-04-08 NOTE — Assessment & Plan Note (Signed)
Stable on zoloft, continue same.  

## 2015-05-20 ENCOUNTER — Telehealth: Payer: Self-pay | Admitting: Behavioral Health

## 2015-05-20 NOTE — Telephone Encounter (Signed)
Left message to confirm appointment for 05/23/15 at 9:00 AM.

## 2015-05-23 ENCOUNTER — Encounter: Payer: Self-pay | Admitting: Family

## 2015-05-23 ENCOUNTER — Ambulatory Visit (INDEPENDENT_AMBULATORY_CARE_PROVIDER_SITE_OTHER): Payer: Medicare Other | Admitting: Family

## 2015-05-23 VITALS — BP 122/74 | HR 58 | Temp 98.0°F | Resp 16 | Ht 67.0 in | Wt 195.8 lb

## 2015-05-23 DIAGNOSIS — Z1231 Encounter for screening mammogram for malignant neoplasm of breast: Secondary | ICD-10-CM

## 2015-05-23 DIAGNOSIS — Z1239 Encounter for other screening for malignant neoplasm of breast: Secondary | ICD-10-CM

## 2015-05-23 DIAGNOSIS — Z0000 Encounter for general adult medical examination without abnormal findings: Secondary | ICD-10-CM | POA: Insufficient documentation

## 2015-05-23 DIAGNOSIS — E559 Vitamin D deficiency, unspecified: Secondary | ICD-10-CM | POA: Diagnosis not present

## 2015-05-23 DIAGNOSIS — Z23 Encounter for immunization: Secondary | ICD-10-CM

## 2015-05-23 DIAGNOSIS — R5382 Chronic fatigue, unspecified: Secondary | ICD-10-CM

## 2015-05-23 DIAGNOSIS — E785 Hyperlipidemia, unspecified: Secondary | ICD-10-CM | POA: Diagnosis not present

## 2015-05-23 DIAGNOSIS — M8580 Other specified disorders of bone density and structure, unspecified site: Secondary | ICD-10-CM

## 2015-05-23 LAB — LIPID PANEL
CHOLESTEROL: 242 mg/dL — AB (ref 0–200)
HDL: 55.4 mg/dL (ref >39.00)
LDL Cholesterol: 160 mg/dL — ABNORMAL HIGH (ref 0–99)
NonHDL: 186.18
TRIGLYCERIDES: 131 mg/dL (ref 0.0–149.0)
Total CHOL/HDL Ratio: 4
VLDL: 26.2 mg/dL (ref 0.0–40.0)

## 2015-05-23 LAB — CBC WITH DIFFERENTIAL/PLATELET
BASOS PCT: 0.3 % (ref 0.0–3.0)
Basophils Absolute: 0 10*3/uL (ref 0.0–0.1)
EOS PCT: 2.1 % (ref 0.0–5.0)
Eosinophils Absolute: 0.2 10*3/uL (ref 0.0–0.7)
HCT: 40.2 % (ref 36.0–46.0)
HEMOGLOBIN: 13.4 g/dL (ref 12.0–15.0)
Lymphocytes Relative: 12.1 % (ref 12.0–46.0)
Lymphs Abs: 0.9 10*3/uL (ref 0.7–4.0)
MCHC: 33.3 g/dL (ref 30.0–36.0)
MCV: 91.1 fl (ref 78.0–100.0)
MONO ABS: 0.5 10*3/uL (ref 0.1–1.0)
MONOS PCT: 6.4 % (ref 3.0–12.0)
Neutro Abs: 6 10*3/uL (ref 1.4–7.7)
Neutrophils Relative %: 79.1 % — ABNORMAL HIGH (ref 43.0–77.0)
Platelets: 295 10*3/uL (ref 150.0–400.0)
RBC: 4.42 Mil/uL (ref 3.87–5.11)
RDW: 14.4 % (ref 11.5–15.5)
WBC: 7.6 10*3/uL (ref 4.0–10.5)

## 2015-05-23 LAB — COMPREHENSIVE METABOLIC PANEL
ALBUMIN: 3.6 g/dL (ref 3.5–5.2)
ALK PHOS: 68 U/L (ref 39–117)
ALT: 10 U/L (ref 0–35)
AST: 15 U/L (ref 0–37)
BILIRUBIN TOTAL: 0.4 mg/dL (ref 0.2–1.2)
BUN: 12 mg/dL (ref 6–23)
CALCIUM: 8.6 mg/dL (ref 8.4–10.5)
CO2: 28 mEq/L (ref 19–32)
Chloride: 105 mEq/L (ref 96–112)
Creatinine, Ser: 0.96 mg/dL (ref 0.40–1.20)
GFR: 60.62 mL/min (ref >60.00)
GLUCOSE: 104 mg/dL — AB (ref 70–99)
POTASSIUM: 3.6 meq/L (ref 3.5–5.1)
Sodium: 140 mEq/L (ref 135–145)
TOTAL PROTEIN: 6.6 g/dL (ref 6.0–8.3)

## 2015-05-23 LAB — VITAMIN D 25 HYDROXY (VIT D DEFICIENCY, FRACTURES): VITD: 19 ng/mL — ABNORMAL LOW (ref 30.00–100.00)

## 2015-05-23 LAB — TSH: TSH: 3.77 u[IU]/mL (ref 0.35–4.50)

## 2015-05-23 NOTE — Progress Notes (Signed)
Pre visit review using our clinic review tool, if applicable. No additional management support is needed unless otherwise documented below in the visit note. 

## 2015-05-23 NOTE — Progress Notes (Signed)
Subjective:    Cassandra Miles is a 74 y.o. female who presents for Medicare Annual/Subsequent preventive examination.  Preventive Screening-Counseling & Management  Tobacco History  Smoking status  . Never Smoker   Smokeless tobacco  . Never Used     Problems Prior to Visit 1.  Barrett's esophagus- On PPI.    2.  HTN- maintained on lisinopril and toprol xl.  BP Readings from Last 3 Encounters:  05/23/15 122/74  04/05/15 120/76  09/28/14 160/85   3.  Depression- maintained on zoloft, depression is well controlled.  Denies panic attacks.   4.  Osteopenia- last dexa >5 yrs ago per pt.   5.  RLS- does not like taking requip- feels that the first hour after she takes med she is sleepy.  Keeps symptoms controlled.    6. Vit D deficiency- not currently on supplement.   7. Preventative care-   Immunizations: due for prevnar, Tetanus 8 yrs ago per pt.   Diet: reports diet is fair Exercise: not exercising Colonoscopy: 2009- normal per pt- no polyps.   Dexa: >5 yrs ago Pap Smear: 6/14 Mammogram:>7 yrs ago  8. Pt reports Fatigue x 8-12 months.    Notes that she feels less safe when driving the last few years.  Wonders about her cognition.    Current Problems (verified) Patient Active Problem List   Diagnosis Date Noted  . Noncompliance   . GERD (gastroesophageal reflux disease) 10/06/2013  . Chest pain 09/30/2013  . Murmur 08/24/2013  . Hyperlipidemia 08/24/2013  . Undiagnosed cardiac murmurs 07/26/2013  . Diarrhea 12/18/2012  . Balance problem 12/18/2012  . Low back pain 12/18/2012  . Vitamin D deficiency 09/11/2011  . HTN (hypertension) 08/15/2011  . OBSTRUCTIVE SLEEP APNEA 10/31/2010  . HYPERCHOLESTEROLEMIA 06/19/2010  . OBESITY 06/19/2010  . RESTLESS LEGS SYNDROME 06/19/2010  . Paroxysmal atrial fibrillation 06/19/2010  . HIATAL HERNIA 06/19/2010  . DIVERTICULOSIS, COLON 06/19/2010  . Depression 06/13/2010  . TEMPOROMANDIBULAR JOINT PAIN 06/13/2010  .  HEPATIC CYST 06/13/2010  . OSTEOARTHRITIS 06/13/2010  . AVASCULAR NECROSIS 06/13/2010  . BARRETT'S ESOPHAGUS, HX OF 06/13/2010    Medications Prior to Visit Current Outpatient Prescriptions on File Prior to Visit  Medication Sig Dispense Refill  . acetaminophen (TYLENOL) 325 MG tablet Take 2 tablets (650 mg total) by mouth every 6 (six) hours as needed for mild pain (or Fever >/= 101).    Marland Kitchen aspirin EC 81 MG tablet Take 1 tablet (81 mg total) by mouth daily. 30 tablet 3  . lisinopril (PRINIVIL,ZESTRIL) 5 MG tablet Take 1 tablet (5 mg total) by mouth daily. 30 tablet 5  . MAGNESIUM PO Take 1 tablet by mouth daily.    . metoprolol succinate (TOPROL-XL) 50 MG 24 hr tablet Take 1 tablet (50 mg total) by mouth daily. Take with or immediately following a meal. 90 tablet 3  . Multiple Vitamin (MULTIVITAMIN) tablet Take 1 tablet by mouth daily.    . naproxen sodium (ANAPROX) 220 MG tablet Take 220 mg by mouth 2 (two) times daily as needed (arthritis pain).     Marland Kitchen omeprazole (PRILOSEC) 40 MG capsule Take 1 capsule (40 mg total) by mouth daily as needed. 30 capsule 3  . rOPINIRole (REQUIP) 1 MG tablet Take 1 tablet (1 mg total) by mouth at bedtime. 30 tablet 3  . sertraline (ZOLOFT) 100 MG tablet Take 1 tablet (100 mg total) by mouth daily. 30 tablet 5   No current facility-administered medications on file prior to visit.  Current Medications (verified) Current Outpatient Prescriptions  Medication Sig Dispense Refill  . acetaminophen (TYLENOL) 325 MG tablet Take 2 tablets (650 mg total) by mouth every 6 (six) hours as needed for mild pain (or Fever >/= 101).    Marland Kitchen aspirin EC 81 MG tablet Take 1 tablet (81 mg total) by mouth daily. 30 tablet 3  . lisinopril (PRINIVIL,ZESTRIL) 5 MG tablet Take 1 tablet (5 mg total) by mouth daily. 30 tablet 5  . MAGNESIUM PO Take 1 tablet by mouth daily.    . metoprolol succinate (TOPROL-XL) 50 MG 24 hr tablet Take 1 tablet (50 mg total) by mouth daily. Take with or  immediately following a meal. 90 tablet 3  . Multiple Vitamin (MULTIVITAMIN) tablet Take 1 tablet by mouth daily.    . naproxen sodium (ANAPROX) 220 MG tablet Take 220 mg by mouth 2 (two) times daily as needed (arthritis pain).     Marland Kitchen omeprazole (PRILOSEC) 40 MG capsule Take 1 capsule (40 mg total) by mouth daily as needed. 30 capsule 3  . rOPINIRole (REQUIP) 1 MG tablet Take 1 tablet (1 mg total) by mouth at bedtime. 30 tablet 3  . sertraline (ZOLOFT) 100 MG tablet Take 1 tablet (100 mg total) by mouth daily. 30 tablet 5   No current facility-administered medications for this visit.     Allergies (verified) Bupropion hcl; Ciprofloxacin; and Penicillins   PAST HISTORY  Family History Family History  Problem Relation Age of Onset  . Arthritis Father   . Stroke Mother     x2  . Heart disease Brother     Stenting   . Hyperlipidemia Brother   . Melanoma Brother   . Colon cancer Paternal Aunt     x 2  . Diabetes Brother   . Heart disease Mother     Social History Social History  Substance Use Topics  . Smoking status: Never Smoker   . Smokeless tobacco: Never Used  . Alcohol Use: No     Are there smokers in your home (other than you)? No  Risk Factors Current exercise habits: not exercising  Dietary issues discussed: yes, weight loss   Cardiac risk factors: advanced age (older than 75 for men, 10 for women), hypertension and sedentary lifestyle.  Depression Screen (Note: if answer to either of the following is "Yes", a more complete depression screening is indicated)   Over the past two weeks, have you felt down, depressed or hopeless? No  Over the past two weeks, have you felt little interest or pleasure in doing things? No  Have you lost interest or pleasure in daily life? No  Do you often feel hopeless? No  Do you cry easily over simple problems? No  Activities of Daily Living In your present state of health, do you have any difficulty performing the following  activities?:  Driving? Yes- see MMSE Managing money?  No Feeding yourself? No Getting from bed to chair? No. Climbing a flight of stairs? No Preparing food and eating?: No Bathing or showering? No Getting dressed: No Getting to the toilet? No Using the toilet:No Moving around from place to place: No In the past year have you fallen or had a near fall?:No   Are you sexually active?  No  Do you have more than one partner?  No  Hearing Difficulties: No Do you often ask people to speak up or repeat themselves? No Do you experience ringing or noises in your ears? No Do you have difficulty  understanding soft or whispered voices? No   Do you feel that you have a problem with memory? No  Do you often misplace items? Yes- always since she was young  Do you feel safe at home?  Yes  Cognitive Testing  Alert? Yes  Normal Appearance?Yes  Oriented to person? Yes  Place? Yes   Time? Yes  Recall of three objects?  Yes  Can perform simple calculations? Yes  Displays appropriate judgment?Yes  Can read the correct time from a watch face?Yes   Advanced Directives have been discussed with the patient? Yes  List the Names of Other Physician/Practitioners you currently use: 1.  Updated in snapshot  Indicate any recent Medical Services you may have received from other than [facility] providers in the past year (date may be approximate).  Immunization History  Administered Date(s) Administered  . Influenza-Unspecified 05/25/2012    Screening Tests Health Maintenance  Topic Date Due  . MAMMOGRAM  10/17/1992  . ZOSTAVAX  10/17/2002  . DEXA SCAN  10/18/2007  . PNA vac Low Risk Adult (1 of 2 - PCV13) 10/18/2007  . INFLUENZA VACCINE  04/25/2015  . COLONOSCOPY  09/24/2017  . TETANUS/TDAP  09/24/2018    All answers were reviewed with the patient and necessary referrals were made:  O'SULLIVAN,Chloe Flis S., NP   05/23/2015   History reviewed: allergies, current medications, past family history,  past medical history, past social history, past surgical history and problem list  Review of Systems Review of Systems  Constitutional: Positive for malaise/fatigue.  HENT: Negative for hearing loss.   Eyes:       Needs cataract surger  Respiratory: Negative for cough.   Cardiovascular:       Occasional palpitations  Gastrointestinal: Negative for constipation.  Genitourinary: Negative for dysuria.  Musculoskeletal:       Occasional back pain- told some degen disc disease  Skin: Negative for rash.  Neurological:       + rls symptoms relieved by requip  Psychiatric/Behavioral:       See HPI      Objective:    Body mass index is 30.66 kg/(m^2). BP 122/74 mmHg  Pulse 58  Temp(Src) 98 F (36.7 C) (Oral)  Resp 16  Ht 5\' 7"  (1.702 m)  Wt 195 lb 12.8 oz (88.814 kg)  BMI 30.66 kg/m2  SpO2 99%     Physical Exam  Constitutional: She is oriented to person, place, and time. She appears well-developed and well-nourished. No distress.  HENT:  Head: Normocephalic and atraumatic.  Right Ear: TM occluded by cerumen Left Ear: Tympanic membrane and ear canal normal.  Mouth/Throat: Oropharynx is clear and moist.  Eyes: Pupils are equal, round, and reactive to light. No scleral icterus.  Neck: Normal range of motion. No thyromegaly present.  Cardiovascular: Normal rate and regular rhythm.   No murmur heard. Pulmonary/Chest: Effort normal and breath sounds normal. No respiratory distress. He has no wheezes. She has no rales. She exhibits no tenderness.  Abdominal: Soft. Bowel sounds are normal. He exhibits no distension and no mass. There is no tenderness. There is no rebound and no guarding.  Musculoskeletal: She exhibits no edema.  Lymphadenopathy:    She has no cervical adenopathy.  Neurological: She is alert and oriented to person, place, and time.  She exhibits normal muscle tone. Coordination normal.  Skin: Skin is warm and dry. multiple spider veins noted bilateral legs.   Psychiatric: She has a normal mood and affect. Her behavior is normal. Judgment and  thought content normal.  Breasts: Examined lying Right: Without masses, retractions, discharge or axillary adenopathy.         Assessment & Plan:      Assessment:        Plan:  Fatigue- obtain tsh, cbc, cmet.  Had normal b12 level last year, check vit D.  Of note a MMSE was performed today and patient scored 28/30 which is in normal range.  I don't think she has any major cognitive deficits at this time.  Monitor.     During the course of the visit the patient was educated and counseled about appropriate screening and preventive services including:    Pneumococcal vaccine   Screening mammography  Bone densitometry screening  Diet review for nutrition referral? Yes ____  Not Indicated _x___   Patient Instructions (the written plan) was given to the patient.  Medicare Attestation I have personally reviewed: The patient's medical and social history Their use of alcohol, tobacco or illicit drugs Their current medications and supplements The patient's functional ability including ADLs,fall risks, home safety risks, cognitive, and hearing and visual impairment Diet and physical activities Evidence for depression or mood disorders  The patient's weight, height, BMI, and visual acuity have been recorded in the chart.  I have made referrals, counseling, and provided education to the patient based on review of the above and I have provided the patient with a written personalized care plan for preventive services.     O'SULLIVAN,Nichoals Heyde S., NP   05/23/2015

## 2015-05-23 NOTE — Patient Instructions (Signed)
Please complete lab work prior to leaving. Schedule bone density and mammogram on the first floor.  

## 2015-05-23 NOTE — Assessment & Plan Note (Signed)
Prevnar today. Discussed diet, exercise weight loss.  Refer for dexa, mammo

## 2015-05-26 ENCOUNTER — Encounter: Payer: Self-pay | Admitting: Family

## 2015-05-26 DIAGNOSIS — E559 Vitamin D deficiency, unspecified: Secondary | ICD-10-CM

## 2015-05-26 MED ORDER — ERGOCALCIFEROL 1.25 MG (50000 UT) PO CAPS: 50000.0000 [IU] | ORAL_CAPSULE | ORAL | Status: DC

## 2015-06-01 ENCOUNTER — Other Ambulatory Visit: Payer: Medicare Other

## 2015-06-06 ENCOUNTER — Telehealth: Payer: Self-pay | Admitting: *Deleted

## 2015-06-06 MED ORDER — LISINOPRIL 5 MG PO TABS: 5.0000 mg | ORAL_TABLET | Freq: Every day | ORAL | Status: DC

## 2015-06-06 NOTE — Telephone Encounter (Signed)
Received fax from [facility] requesting 90 day supply of lisinopril. Rx sent.

## 2015-06-10 ENCOUNTER — Ambulatory Visit (HOSPITAL_BASED_OUTPATIENT_CLINIC_OR_DEPARTMENT_OTHER)
Admission: RE | Admit: 2015-06-10 | Discharge: 2015-06-10 | Disposition: A | Discharge status: Home / Self Care | Payer: Medicare Other | Source: Ambulatory Visit | Attending: Family | Admitting: Family

## 2015-06-10 DIAGNOSIS — Z1231 Encounter for screening mammogram for malignant neoplasm of breast: Secondary | ICD-10-CM | POA: Insufficient documentation

## 2015-06-10 DIAGNOSIS — R928 Other abnormal and inconclusive findings on diagnostic imaging of breast: Secondary | ICD-10-CM | POA: Insufficient documentation

## 2015-06-10 DIAGNOSIS — M8580 Other specified disorders of bone density and structure, unspecified site: Secondary | ICD-10-CM

## 2015-06-10 DIAGNOSIS — M810 Age-related osteoporosis without current pathological fracture: Secondary | ICD-10-CM | POA: Insufficient documentation

## 2015-06-10 DIAGNOSIS — Z8262 Family history of osteoporosis: Secondary | ICD-10-CM | POA: Diagnosis not present

## 2015-06-10 DIAGNOSIS — Z1239 Encounter for other screening for malignant neoplasm of breast: Secondary | ICD-10-CM

## 2015-06-14 ENCOUNTER — Telehealth: Payer: Self-pay | Admitting: Family

## 2015-06-14 NOTE — Telephone Encounter (Signed)
Left message on voicemail to check mychart message re: below instructions/recommendations.

## 2015-06-14 NOTE — Telephone Encounter (Signed)
Cassandra Miles- Please contact pt and let her know that I reviewed her bone density and it shows osteoporosis.  I would like to try to get her approved for twice yearly prolia injection (this is easier on the stomach than fosamax - if her insurance will pay for it).    Rose, could you please initiate Prolia approval with pt's insurance. She has hx of Barrett's esophagus.

## 2015-06-15 NOTE — Telephone Encounter (Signed)
Pt checked mychart and said that she would like to speak with you regarding medicine. Best # [phone].

## 2015-06-15 NOTE — Telephone Encounter (Signed)
Spoke with pt. She states she would like to proceed with Prolia if insurance approves it. Advised her we will call her once we verify benefits and she voices understanding. Awaiting benefit information.

## 2015-06-17 ENCOUNTER — Telehealth: Payer: Self-pay | Admitting: Family

## 2015-06-17 NOTE — Telephone Encounter (Signed)
Please let pt know that the radiologist would like her to complete some additional breast images for further evaluation. Let me know if she has not been contacted by them about a follow up appointment in 1 week.   

## 2015-06-20 ENCOUNTER — Other Ambulatory Visit: Payer: Self-pay | Admitting: Family

## 2015-06-20 DIAGNOSIS — R928 Other abnormal and inconclusive findings on diagnostic imaging of breast: Secondary | ICD-10-CM

## 2015-06-20 NOTE — Telephone Encounter (Signed)
Left message for pt to return my call.

## 2015-06-20 NOTE — Telephone Encounter (Signed)
Notified pt and she voices understanding.  States she is going out of town today and will return next Thursday. Gave [facility] to [facility] to schedule imaging studies when she returns.  Also notified the [facility].

## 2015-06-27 ENCOUNTER — Ambulatory Visit
Admission: RE | Admit: 2015-06-27 | Discharge: 2015-06-27 | Disposition: A | Discharge status: Home / Self Care | Payer: Medicare Other | Source: Ambulatory Visit | Attending: Family | Admitting: Family

## 2015-06-27 DIAGNOSIS — R928 Other abnormal and inconclusive findings on diagnostic imaging of breast: Secondary | ICD-10-CM

## 2015-07-04 NOTE — Telephone Encounter (Signed)
I have rec'd Cassandra Miles's insurance verification for Prolia and her estimated responsibility is a $49 co-pay whether an OV is billed or not.  Please make her aware this is only an estimate and we will not know an exact amount until her ins pays.  I have sent the summary of benefits to be scanned into her chart.  Please let me know if you have any questions. Thank you.

## 2015-07-13 NOTE — Telephone Encounter (Signed)
Notified pt and she has lab appt on 08/24/15 at [place] and will do nurse visit for Prolia inj at 9:30am. Medication has been ordered.

## 2015-07-31 ENCOUNTER — Other Ambulatory Visit: Payer: Self-pay | Admitting: Nurse Practitioner

## 2015-08-21 ENCOUNTER — Other Ambulatory Visit: Payer: Self-pay | Admitting: Family

## 2015-08-23 NOTE — Telephone Encounter (Signed)
Refill sent per [facility] refill protocol/SLS  

## 2015-08-24 ENCOUNTER — Ambulatory Visit (INDEPENDENT_AMBULATORY_CARE_PROVIDER_SITE_OTHER): Payer: Medicare Other

## 2015-08-24 ENCOUNTER — Other Ambulatory Visit (INDEPENDENT_AMBULATORY_CARE_PROVIDER_SITE_OTHER): Payer: Medicare Other

## 2015-08-24 ENCOUNTER — Telehealth: Payer: Self-pay | Admitting: Family

## 2015-08-24 DIAGNOSIS — R7989 Other specified abnormal findings of blood chemistry: Secondary | ICD-10-CM

## 2015-08-24 DIAGNOSIS — M810 Age-related osteoporosis without current pathological fracture: Secondary | ICD-10-CM

## 2015-08-24 DIAGNOSIS — E559 Vitamin D deficiency, unspecified: Secondary | ICD-10-CM | POA: Diagnosis not present

## 2015-08-24 LAB — VITAMIN D 25 HYDROXY (VIT D DEFICIENCY, FRACTURES): VITD: 23.25 ng/mL — ABNORMAL LOW (ref 30.00–100.00)

## 2015-08-24 MED ORDER — DENOSUMAB 60 MG/ML ~~LOC~~ SOLN
60.0000 mg | Freq: Once | SUBCUTANEOUS | Status: AC
  Administered 2015-08-24: 60 mg via SUBCUTANEOUS

## 2015-08-24 NOTE — Telephone Encounter (Signed)
Vitamin D level is low.  Advise patient to begin vit D 50000 units once weekly for 12 weeks, then repeat vit D level (dx Vit D deficiency).     

## 2015-08-25 MED ORDER — ERGOCALCIFEROL 1.25 MG (50000 UT) PO CAPS: 50000.0000 [IU] | ORAL_CAPSULE | ORAL | Status: DC

## 2015-08-25 NOTE — Telephone Encounter (Signed)
Rx sent.  Pt notified. 

## 2015-09-20 ENCOUNTER — Other Ambulatory Visit: Payer: Self-pay | Admitting: Family

## 2015-09-20 NOTE — Telephone Encounter (Signed)
Rx sent 

## 2015-09-20 NOTE — Telephone Encounter (Signed)
Pt is requesting refill on Requip. [facility] Pt.  Last OV: 05/23/2015 Last Fill: 08/23/2015 #30 and 0RF   Okay to refill?

## 2015-09-20 NOTE — Telephone Encounter (Signed)
Refill x1 

## 2015-09-28 ENCOUNTER — Other Ambulatory Visit: Payer: Self-pay | Admitting: Nurse Practitioner

## 2015-09-30 ENCOUNTER — Encounter: Payer: Self-pay | Admitting: Medical

## 2015-09-30 ENCOUNTER — Emergency Department (HOSPITAL_BASED_OUTPATIENT_CLINIC_OR_DEPARTMENT_OTHER): Payer: Medicare Other

## 2015-09-30 ENCOUNTER — Telehealth: Payer: Self-pay | Admitting: Family

## 2015-09-30 ENCOUNTER — Encounter (HOSPITAL_BASED_OUTPATIENT_CLINIC_OR_DEPARTMENT_OTHER): Payer: Self-pay

## 2015-09-30 ENCOUNTER — Ambulatory Visit (INDEPENDENT_AMBULATORY_CARE_PROVIDER_SITE_OTHER): Payer: Medicare Other | Admitting: Medical

## 2015-09-30 ENCOUNTER — Emergency Department (HOSPITAL_BASED_OUTPATIENT_CLINIC_OR_DEPARTMENT_OTHER)
Admission: EM | Admit: 2015-09-30 | Discharge: 2015-09-30 | Disposition: A | Discharge status: Home / Self Care | Payer: Medicare Other | Attending: Emergency Medicine | Admitting: Emergency Medicine

## 2015-09-30 VITALS — BP 120/80 | HR 67 | Temp 98.0°F | Ht 67.0 in | Wt 197.6 lb

## 2015-09-30 DIAGNOSIS — Z9119 Patient's noncompliance with other medical treatment and regimen: Secondary | ICD-10-CM | POA: Insufficient documentation

## 2015-09-30 DIAGNOSIS — E785 Hyperlipidemia, unspecified: Secondary | ICD-10-CM | POA: Diagnosis not present

## 2015-09-30 DIAGNOSIS — Z880 Allergy status to penicillin: Secondary | ICD-10-CM | POA: Diagnosis not present

## 2015-09-30 DIAGNOSIS — M79606 Pain in leg, unspecified: Secondary | ICD-10-CM

## 2015-09-30 DIAGNOSIS — R0602 Shortness of breath: Secondary | ICD-10-CM | POA: Insufficient documentation

## 2015-09-30 DIAGNOSIS — M7121 Synovial cyst of popliteal space [Baker], right knee: Secondary | ICD-10-CM | POA: Insufficient documentation

## 2015-09-30 DIAGNOSIS — I480 Paroxysmal atrial fibrillation: Secondary | ICD-10-CM | POA: Insufficient documentation

## 2015-09-30 DIAGNOSIS — F329 Major depressive disorder, single episode, unspecified: Secondary | ICD-10-CM | POA: Diagnosis not present

## 2015-09-30 DIAGNOSIS — K219 Gastro-esophageal reflux disease without esophagitis: Secondary | ICD-10-CM | POA: Insufficient documentation

## 2015-09-30 DIAGNOSIS — R011 Cardiac murmur, unspecified: Secondary | ICD-10-CM | POA: Diagnosis not present

## 2015-09-30 DIAGNOSIS — Z7982 Long term (current) use of aspirin: Secondary | ICD-10-CM | POA: Diagnosis not present

## 2015-09-30 DIAGNOSIS — Z79899 Other long term (current) drug therapy: Secondary | ICD-10-CM | POA: Diagnosis not present

## 2015-09-30 DIAGNOSIS — E559 Vitamin D deficiency, unspecified: Secondary | ICD-10-CM | POA: Diagnosis not present

## 2015-09-30 DIAGNOSIS — M1990 Unspecified osteoarthritis, unspecified site: Secondary | ICD-10-CM | POA: Diagnosis not present

## 2015-09-30 DIAGNOSIS — H269 Unspecified cataract: Secondary | ICD-10-CM | POA: Diagnosis not present

## 2015-09-30 DIAGNOSIS — Z8711 Personal history of peptic ulcer disease: Secondary | ICD-10-CM | POA: Insufficient documentation

## 2015-09-30 DIAGNOSIS — M79609 Pain in unspecified limb: Secondary | ICD-10-CM

## 2015-09-30 DIAGNOSIS — M25561 Pain in right knee: Secondary | ICD-10-CM | POA: Diagnosis present

## 2015-09-30 DIAGNOSIS — G2581 Restless legs syndrome: Secondary | ICD-10-CM | POA: Diagnosis not present

## 2015-09-30 DIAGNOSIS — R0600 Dyspnea, unspecified: Secondary | ICD-10-CM

## 2015-09-30 DIAGNOSIS — I10 Essential (primary) hypertension: Secondary | ICD-10-CM | POA: Insufficient documentation

## 2015-09-30 LAB — BASIC METABOLIC PANEL
Anion gap: 5 (ref 5–15)
BUN: 29 mg/dL — ABNORMAL HIGH (ref 6–20)
CHLORIDE: 107 mmol/L (ref 101–111)
CO2: 27 mmol/L (ref 22–32)
CREATININE: 0.9 mg/dL (ref 0.44–1.00)
Calcium: 8.6 mg/dL — ABNORMAL LOW (ref 8.9–10.3)
GFR calc Af Amer: >60 mL/min (ref >60)
GLUCOSE: 108 mg/dL — AB (ref 65–99)
POTASSIUM: 4 mmol/L (ref 3.5–5.1)
SODIUM: 139 mmol/L (ref 135–145)

## 2015-09-30 LAB — CBC WITH DIFFERENTIAL/PLATELET
Basophils Absolute: 0 10*3/uL (ref 0.0–0.1)
Basophils Relative: 0 %
EOS ABS: 0.2 10*3/uL (ref 0.0–0.7)
EOS PCT: 2 %
HCT: 41.3 % (ref 36.0–46.0)
Hemoglobin: 13.3 g/dL (ref 12.0–15.0)
LYMPHS ABS: 1.2 10*3/uL (ref 0.7–4.0)
LYMPHS PCT: 16 %
MCH: 29.6 pg (ref 26.0–34.0)
MCHC: 32.2 g/dL (ref 30.0–36.0)
MCV: 92 fL (ref 78.0–100.0)
MONO ABS: 0.6 10*3/uL (ref 0.1–1.0)
Monocytes Relative: 7 %
Neutro Abs: 5.5 10*3/uL (ref 1.7–7.7)
Neutrophils Relative %: 75 %
PLATELETS: 308 10*3/uL (ref 150–400)
RBC: 4.49 MIL/uL (ref 3.87–5.11)
RDW: 14.8 % (ref 11.5–15.5)
WBC: 7.4 10*3/uL (ref 4.0–10.5)

## 2015-09-30 LAB — TROPONIN I

## 2015-09-30 LAB — D-DIMER, QUANTITATIVE (NOT AT ARMC): D DIMER QUANT: 0.5 ug{FEU}/mL (ref 0.00–0.50)

## 2015-09-30 NOTE — Telephone Encounter (Signed)
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 Has a lot of broken veins, leg is in pain and swollen.   Nurse Assessment  Nurse: Mallie Mussel, RN, Alveta Heimlich Date/Time Eilene Ghazi Time): 09/30/2015 10:16:02 AM  Confirm and document reason for call. If symptomatic, describe symptoms. ---Caller states that she has a genetic predisposition for blood clots. In her right leg, she has swelling around her knee and a little bit below the knee. She rates her pain as 8 on 0-10 scale. The pain is on the back of her knee. The skin feels warmer than usual. She cannot tell if she has reddened skin. Denies fever. She has SOB with exertion and this is new for her.  Has the patient traveled out of the country within the last 30 days? ---No  Does the patient have any new or worsening symptoms? ---Yes  Will a triage be completed? ---Yes  Related visit to physician within the last 2 weeks? ---No  Does the PT have any chronic conditions? (i.e. diabetes, asthma, etc.) ---Yes  List chronic conditions. ---HTN  Is this a behavioral health or substance abuse call? ---No     Guidelines    Guideline Title Affirmed Question Affirmed Notes  Knee Pain [1] SEVERE pain (e.g., excruciating, unable to walk) AND [2] not improved after 2 hours of pain medicine    Final Disposition User   See Physician within 4 Hours (or PCP triage) Mallie Mussel, RN, Alveta Heimlich    Comments  Appointment made for 1:00p today with Mackie Pai PA.   Referrals  REFERRED TO PCP OFFICE   Disagree/Comply: Comply

## 2015-09-30 NOTE — Discharge Instructions (Signed)
Baker Cyst °A Baker cyst is a sac-like structure that forms in the back of the knee. It is filled with the same fluid that is located in your knee. This fluid lubricates the bones and cartilage of the knee and allows them to move over each other more easily. °CAUSES  °When the knee becomes injured or inflamed, increased fluid forms in the knee. When this happens, the joint lining is pushed out behind the knee and forms the Baker cyst. This cyst may also be caused by inflammation from arthritic conditions and infections. °SIGNS AND SYMPTOMS  °A Baker cyst usually has no symptoms. When the cyst is substantially enlarged: °· You may feel pressure behind the knee, stiffness in the knee, or a mass in the area behind the knee. °· You may develop pain, redness, and swelling in the calf.  This can suggest a blood clot and requires evaluation by your health care provider. °DIAGNOSIS  °A Baker cyst is most often found during an ultrasound exam. This exam may have been performed for other reasons, and the cyst was found incidentally. Sometimes an MRI is used. This picks up other problems within a joint that an ultrasound exam may not. If the Baker cyst developed immediately after an injury, X-ray exams may be used to diagnose the cyst. °TREATMENT  °The treatment depends on the cause of the cyst. Anti-inflammatory medicines and rest often will be prescribed. If the cyst is caused by a bacterial infection, antibiotic medicines may be prescribed.  °HOME CARE INSTRUCTIONS  °· If the cyst was caused by an injury, for the first 24 hours, keep the injured leg elevated on 2 pillows while lying down. °· For the first 24 hours while you are awake, apply ice to the injured area: °¨ Put ice in a plastic bag. °¨ Place a towel between your skin and the bag. °¨ Leave the ice on for 20 minutes, 2-3 times a day. °· Only take over-the-counter or prescription medicines for pain, discomfort, or fever as directed by your health care  provider. °· Only take antibiotic medicine as directed. Make sure to finish it even if you start to feel better. °MAKE SURE YOU:  °· Understand these instructions. °· Will watch your condition. °· Will get help right away if you are not doing well or get worse. °  °This information is not intended to replace advice given to you by your health care provider. Make sure you discuss any questions you have with your health care provider. °  °Document Released: 09/10/2005 Document Revised: 07/01/2013 Document Reviewed: 04/22/2013 °Elsevier Interactive Patient Education ©2016 [employer] ° °

## 2015-09-30 NOTE — ED Notes (Signed)
C/o bilat swelling, pain x 1 year-right knee worse x 4-5 days-denies injury or extended travel-steady gait-NAD-sent from PCP in building

## 2015-09-30 NOTE — Telephone Encounter (Signed)
Pt was not having any chest pain and no history of COPD or Asthma. Pt states that she has a family history of stroke and wanted to make sure that it was nothing major was happening with the history.

## 2015-09-30 NOTE — Telephone Encounter (Signed)
Left message for pt to call back to follow up on note.

## 2015-09-30 NOTE — Progress Notes (Signed)
Pre visit review using our clinic review tool, if applicable. No additional management support is needed unless otherwise documented below in the visit note. 

## 2015-09-30 NOTE — Progress Notes (Signed)
Subjective:    Patient ID: Cassandra Miles, female    DOB: 1941-05-15, 75 y.o.   MRN: [id number]  HPI   Pt in for some pain in her rt leg behind the knee. She states some swelling. She points to her rt politeal area. Pt states family members have genetic predisposition to dvt. Pt states pain in the past off an on. But recently pain behind knee has been more constant for one week. Last day little worse.  Pt not feeling sob now. But felt sob yesterday making bed.(describes moderate sob at that time.) Also walking over past week will make her sob.  No chest pain.  Pt relatives have prothrombin gene mutation. Pt never had this tested before.   Review of Systems  Constitutional: Negative for chills and fatigue.  Respiratory: Positive for shortness of breath. Negative for cough, chest tightness and wheezing.        Brief yesterday.  Cardiovascular: Negative for chest pain and palpitations.  Gastrointestinal: Negative for abdominal pain.  Musculoskeletal: Negative for back pain.       Rt popliteal pain  Skin: Negative for pallor and rash.  Neurological: Negative for dizziness, syncope, weakness and headaches.  Hematological: Negative for adenopathy. Does not bruise/bleed easily.    Past Medical History  Diagnosis Date  . Restless leg syndrome   . Hypertension   . PAF (paroxysmal atrial fibrillation) ([place])     a. 1 documented episode in 2008.  Marland Kitchen Palpitations     a. 08/2013 event monitor: no events but was noncompliant;  b. metoprolol added 09/2013.  Marland Kitchen Sleep apnea   . Depression   . Peptic ulcer disease   . GERD (gastroesophageal reflux disease)   . Barrett esophagus   . Avascular necrosis ([place])   . Osteoarthritis     a. knee  . Hepatic cyst   . Hyperlipidemia   . Blood transfusion without reported diagnosis     WITH  HIP REPLACEMENT 06,07  . Cataract   . Spinal stenosis   . Atypical chest pain     a. 09/2013 Admitted -> Negative stress echo, EF 60%, no rwma.  .  Noncompliance   . Vitamin D deficiency     Social History   Social History  . Marital Status: Divorced    Spouse Name: N/A  . Number of Children: N/A  . Years of Education: N/A   Occupational History  . retired     Pharmacist, hospital   Social History Main Topics  . Smoking status: Never Smoker   . Smokeless tobacco: Never Used  . Alcohol Use: No  . Drug Use: No  . Sexual Activity: Not on file   Other Topics Concern  . Not on file   Social History Narrative   Moved from [place], [place]   retired Education officer, museum   Lives alone- divorced since 1992   Has some cousins near- one cousin in [place], one in [place]   One son in [place], one son in [place], Mental illness- was incarcerated as a teen.      Past Surgical History  Procedure Laterality Date  . Hip fracture surgery      Bilateral hip replacements  . Orif wrist fracture  09/14/2011    Procedure: OPEN REDUCTION INTERNAL FIXATION (ORIF) WRIST FRACTURE;  Surgeon: Willa Frater III;  Location: [facility];  Service: Orthopedics;  Laterality: N/A;  . Cholecystectomy    . Tubal ligation    . Decompression core hip  bilateral hips  . Breast reduction surgery    . Colonoscopy      Family History  Problem Relation Age of Onset  . Arthritis Father   . Stroke Mother     x2  . Heart disease Brother     Stenting   . Hyperlipidemia Brother   . Melanoma Brother   . Colon cancer Paternal Aunt     x 2  . Diabetes Brother   . Heart disease Mother     Allergies  Allergen Reactions  . Bupropion Hcl Other (See Comments)    Made her feel bad  . Ciprofloxacin Nausea And Vomiting    REACTION: hives  . Penicillins Rash    REACTION: rash    Current Outpatient Prescriptions on File Prior to Visit  Medication Sig Dispense Refill  . acetaminophen (TYLENOL) 325 MG tablet Take 2 tablets (650 mg total) by mouth every 6 (six) hours as needed for mild pain (or Fever >/= 101).    Marland Kitchen aspirin EC 81 MG tablet Take 1 tablet (81 mg  total) by mouth daily. 30 tablet 3  . ergocalciferol (VITAMIN D2) 50000 UNITS capsule Take 1 capsule (50,000 Units total) by mouth once a week. 12 capsule 0  . lisinopril (PRINIVIL,ZESTRIL) 5 MG tablet Take 1 tablet (5 mg total) by mouth daily. 90 tablet 1  . MAGNESIUM PO Take 1 tablet by mouth daily.    . metoprolol succinate (TOPROL-XL) 50 MG 24 hr tablet take 1 tablet by mouth WITH OR IMMEDIATELY FOLLOWING A MEAL daily 15 tablet 0  . Multiple Vitamin (MULTIVITAMIN) tablet Take 1 tablet by mouth daily.    . naproxen sodium (ANAPROX) 220 MG tablet Take 220 mg by mouth 2 (two) times daily as needed (arthritis pain).     Marland Kitchen omeprazole (PRILOSEC) 40 MG capsule Take 1 capsule (40 mg total) by mouth daily as needed. 30 capsule 3  . rOPINIRole (REQUIP) 1 MG tablet Take 1 tablet (1 mg total) by mouth at bedtime. 30 tablet 0  . sertraline (ZOLOFT) 100 MG tablet Take 1 tablet (100 mg total) by mouth daily. 30 tablet 5   No current facility-administered medications on file prior to visit.    BP 120/80 mmHg  Pulse 67  Temp(Src) 98 F (36.7 C) (Oral)  Ht 5\' 7"  (1.702 m)  Wt 197 lb 9.6 oz (89.631 kg)  BMI 30.94 kg/m2  SpO2 98%       Objective:   Physical Exam  General- No acute distress. Pleasant patient. Neck- Full range of motion, no jvd Lungs- Clear, even and unlabored. Heart- regular rate and rhythm. Neurologic- CNII- XII grossly intact.  Rt lower ext- + homan sign. buldge in popliteal fossa.       Assessment & Plan:  I have talked to the ED physician and notified him of your popliteal pain, dyspnea and family history of prothrombin gene mutation.   Please go down to ED now and give them the AVS. They will likely do studies to see if you have DVT or other condition.  Follow up with [place] after ED eval as they determine or as needed

## 2015-09-30 NOTE — Patient Instructions (Signed)
I have talked to the ED physician and notified him of your popliteal pain, dyspnea and family history of prothrombin gene mutation.   Please go down to ED now and give them the AVS. They will likely do studies to see if you have DVT or other condition.  Follow up with [place] after ED eval as they determine or as needed

## 2015-09-30 NOTE — ED Provider Notes (Signed)
CSN: [id number]     Arrival date & time 09/30/15  1402 History   First MD Initiated Contact with Patient 09/30/15 1502     Chief Complaint  Patient presents with  . Knee Pain     (Consider location/radiation/quality/duration/timing/severity/associated sxs/prior Treatment) Patient is a 75 y.o. female presenting with knee pain. The history is provided by the patient.  Knee Pain Location:  Knee Time since incident:  4 days Injury: no   Knee location:  R knee Pain details:    Quality:  Aching and throbbing   Radiates to:  R leg   Severity:  Moderate   Onset quality:  Gradual   Timing:  Intermittent   Progression:  Worsening Chronicity:  Recurrent Prior injury to area:  No Relieved by:  Rest Worsened by:  Activity Ineffective treatments:  Arthritis medication Associated symptoms: decreased ROM and swelling   Associated symptoms: no back pain, no fever and no muscle weakness   Risk factors comment:  Family hx of clotting disorder but no clotting in the pt   Past Medical History  Diagnosis Date  . Restless leg syndrome   . Hypertension   . PAF (paroxysmal atrial fibrillation) ([place])     a. 1 documented episode in 2008.  Marland Kitchen Palpitations     a. 08/2013 event monitor: no events but was noncompliant;  b. metoprolol added 09/2013.  Marland Kitchen Sleep apnea   . Depression   . Peptic ulcer disease   . GERD (gastroesophageal reflux disease)   . Barrett esophagus   . Avascular necrosis ([place])   . Osteoarthritis     a. knee  . Hepatic cyst   . Hyperlipidemia   . Blood transfusion without reported diagnosis     WITH  HIP REPLACEMENT 06,07  . Cataract   . Spinal stenosis   . Atypical chest pain     a. 09/2013 Admitted -> Negative stress echo, EF 60%, no rwma.  . Noncompliance   . Vitamin D deficiency   . Vitamin D deficiency    Past Surgical History  Procedure Laterality Date  . Hip fracture surgery      Bilateral hip replacements  . Orif wrist fracture  09/14/2011    Procedure: OPEN  REDUCTION INTERNAL FIXATION (ORIF) WRIST FRACTURE;  Surgeon: Willa Frater III;  Location: [facility];  Service: Orthopedics;  Laterality: N/A;  . Cholecystectomy    . Tubal ligation    . Decompression core hip      bilateral hips  . Breast reduction surgery    . Colonoscopy     Family History  Problem Relation Age of Onset  . Arthritis Father   . Stroke Mother     x2  . Heart disease Brother     Stenting   . Hyperlipidemia Brother   . Melanoma Brother   . Colon cancer Paternal Aunt     x 2  . Diabetes Brother   . Heart disease Mother    Social History  Substance Use Topics  . Smoking status: Never Smoker   . Smokeless tobacco: Never Used  . Alcohol Use: No   OB History    No data available     Review of Systems  Constitutional: Negative for fever.  Respiratory: Positive for shortness of breath.        Gradual SOB over the last 6 months.  NO recent changes.  Will occasionally get swelling in bilateral legs but nothing recently.  No chest pain in the last 2  weeks.  Musculoskeletal: Negative for back pain.  All other systems reviewed and are negative.     Allergies  Bupropion hcl; Ciprofloxacin; and Penicillins  Home Medications   Prior to Admission medications   Medication Sig Start Date End Date Taking? Authorizing Provider  acetaminophen (TYLENOL) 325 MG tablet Take 2 tablets (650 mg total) by mouth every 6 (six) hours as needed for mild pain (or Fever >/= 101). 10/06/13   Delfina Redwood, MD  aspirin EC 81 MG tablet Take 1 tablet (81 mg total) by mouth daily. 08/24/13   Dorothy Spark, MD  ergocalciferol (VITAMIN D2) 50000 UNITS capsule Take 1 capsule (50,000 Units total) by mouth once a week. 08/25/15   Debbrah Alar, NP  lisinopril (PRINIVIL,ZESTRIL) 5 MG tablet Take 1 tablet (5 mg total) by mouth daily. 06/06/15   Debbrah Alar, NP  MAGNESIUM PO Take 1 tablet by mouth daily.    Historical Provider, MD  metoprolol succinate (TOPROL-XL) 50 MG 24 hr  tablet take 1 tablet by mouth WITH OR IMMEDIATELY FOLLOWING A MEAL daily 09/28/15   Rogelia Mire, NP  Multiple Vitamin (MULTIVITAMIN) tablet Take 1 tablet by mouth daily.    Historical Provider, MD  naproxen sodium (ANAPROX) 220 MG tablet Take 220 mg by mouth 2 (two) times daily as needed (arthritis pain).     Historical Provider, MD  omeprazole (PRILOSEC) 40 MG capsule Take 1 capsule (40 mg total) by mouth daily as needed. 12/23/14   Lafayette Dragon, MD  rOPINIRole (REQUIP) 1 MG tablet Take 1 tablet (1 mg total) by mouth at bedtime. 09/20/15   Rosalita Chessman, DO  sertraline (ZOLOFT) 100 MG tablet Take 1 tablet (100 mg total) by mouth daily. 04/05/15   Debbrah Alar, NP   BP 142/84 mmHg  Pulse 61  Temp(Src) 98.2 F (36.8 C) (Oral)  Resp 18  Ht 5\' 7"  (1.702 m)  Wt 196 lb (88.905 kg)  BMI 30.69 kg/m2  SpO2 99% Physical Exam  Constitutional: She is oriented to person, place, and time. She appears well-developed and well-nourished. No distress.  HENT:  Head: Normocephalic and atraumatic.  Mouth/Throat: Oropharynx is clear and moist.  Eyes: Conjunctivae and EOM are normal. Pupils are equal, round, and reactive to light.  Neck: Normal range of motion. Neck supple.  Cardiovascular: Normal rate, regular rhythm and intact distal pulses.   Murmur heard.  Systolic murmur is present with a grade of 2/6  Murmur heard best at the LSB  Pulmonary/Chest: Effort normal and breath sounds normal. No respiratory distress. She has no wheezes. She has no rales.  Abdominal: Soft. She exhibits no distension. There is no tenderness. There is no rebound and no guarding.  Musculoskeletal: Normal range of motion. She exhibits no edema or tenderness.  Neurological: She is alert and oriented to person, place, and time.  Skin: Skin is warm and dry. No rash noted. No erythema.  Psychiatric: She has a normal mood and affect. Her behavior is normal.  Nursing note and vitals reviewed.   ED Course  Procedures  (including critical care time) Labs Review Labs Reviewed  BASIC METABOLIC PANEL - Abnormal; Notable for the following:    Glucose, Bld 108 (*)    BUN 29 (*)    Calcium 8.6 (*)    All other components within normal limits  D-DIMER, QUANTITATIVE (NOT AT [facility])  CBC WITH DIFFERENTIAL/PLATELET  TROPONIN I    Imaging Review Dg Chest 2 View  09/30/2015  CLINICAL DATA:  Short of breath with exertion.  Right knee pain. EXAM: CHEST  2 VIEW COMPARISON:  10/05/2013 FINDINGS: The heart is mildly enlarged. There is atherosclerosis of the aorta. There is mild atelectasis or scarring of both lung bases not seen on the previous study. The remainder of the chest is clear. The vascularity is normal. No effusions. No bony abnormalities. IMPRESSION: Newly seen linear markings of both lung bases which could be scarring or mild basilar atelectasis. Atherosclerosis of the aorta. Mild cardiomegaly. Electronically Signed   By: Nelson Chimes M.D.   On: 09/30/2015 15:32   US Venous Img Lower Unilateral Right  09/30/2015  CLINICAL DATA:  Chronic bilateral knee swelling EXAM: RIGHT LOWER EXTREMITY VENOUS DUPLEX ULTRASOUND TECHNIQUE: Doppler venous assessment of the right lower extremity deep venous system was performed, including characterization of spectral flow, compressibility, and phasicity. COMPARISON:  None. FINDINGS: There is complete compressibility of the right common femoral, femoral, and popliteal vein. Doppler analysis demonstrates respiratory phasicity and augmentation of flow upon calf compression. 4.5 x 2.0 x 1.6 cm right popliteal fossa Baker's cyst. IMPRESSION: No evidence of DVT. Baker's cyst. Electronically Signed   By: Marybelle Killings M.D.   On: 09/30/2015 16:27   Dg Knee Complete 4 Views Right  09/30/2015  CLINICAL DATA:  Right knee pain without known injury; family history of blood clots EXAM: RIGHT KNEE - COMPLETE 4+ VIEW COMPARISON:  None in PACs FINDINGS: The bones of the right knee are adequately  mineralized. There is mild narrowing of the medial joint compartment. There is mild beaking of the tibial spines. There small osteophytes arising from the superior and inferior articular margins of the patella. The tibial plateaus are intact. There is no acute fracture or dislocation. There is no joint effusion. IMPRESSION: There mild osteoarthritic changes centered on the medial and patellofemoral joint compartments. There is no acute fracture nor dislocation. Electronically Signed   By: David  Martinique M.D.   On: 09/30/2015 15:33   I have personally reviewed and evaluated these images and lab results as part of my medical decision-making.   EKG Interpretation None      MDM   Final diagnoses:  Baker's cyst of knee, right    Patient is a 75 year old female with a history of paroxysmal atrial fibrillation, heart murmur, peptic ulcer disease, osteoarthritis who presents today with worsening pain and swelling in the right knee and popliteal fossa. She was seen by her PCP who is sent down here for further investigation. Patient over the last 6-8 months has had gradually progressive shortness of breath but denies any acute worsening shortness of breath or chest pain. She denies any redness to her leg and denies fevers. She does have a hip family history of a clotting disorder were multiple cousins and a nephew had blood clots but she's never been tested for this mutation.  She has never had blood clots and denies any recent travel or immobilization. She states for the last year she's had intermittent right knee pain and did receive a cortisone injection approximately 8 months ago by an orthopedist. The pain over the last 4 days has been slightly atypical now it is present even at rest and radiates down her leg which is new from prior. She denies any numbness or tingling in the right foot.  Patient is in no acute distress on exam but does have a noted heart murmur which is most likely the cause of her  shortness of breath. EKG shows evidence of LVH with some  nonspecific ST and T-wave changes. Knee has tenderness in the popliteal fossa and proximal calf with minimal effusion noted.  X-ray and Doppler of the leg pending for further evaluation. Low suspicion at this time the patient has a PE and feel most likely her shortness of breath is related to cardiac issues. She has no symptoms today concerning for angina or MI however she does have a follow-up appointment with her cardiologist in the next 2 weeks and recommended that she discuss with them her worsening shortness of breath and she may require a new echo.  Patient has had lab work done today which showed a normal d-dimer, BMP, CBC and troponin are all without acute findings. Knee film showed mild osteoarthritic changes without acute findings. Chest x-ray showed liver sclerosis and mild cardiomegaly.  Doppler peptic  4:55 PM Imaging neg for clot but does show baker's cyst. p t given knee sleeve and she has ortho to follow up with.   Blanchie Dessert, MD 09/30/15 1655

## 2015-09-30 NOTE — Telephone Encounter (Signed)
Sounds like needs to go to ED.

## 2015-10-19 NOTE — Progress Notes (Signed)
Cardiology Office Note:    Date:  10/20/2015   ID:  Cassandra Miles, DOB 03/11/1941, MRN [id number]  PCP:  Nance Pear., NP  Cardiologist:  Dr. Ena Dawley   Electrophysiologist:  n/a  Chief Complaint  Patient presents with  . Atrial Fibrillation    Follow up    History of Present Illness:    Cassandra Miles is a 75 y.o. female with a hx of palpitations, PAF, HTN, OSA, GERD, HL.  Last seen in this clinic with Ignacia Bayley, NP 10/15.  She was seen in the ED 09/30/15 for R leg swelling.  [place] was neg for DVT but did show a Baker's cyst.  Patient noted symptoms of dyspnea and FU with Cardiology was recommended.  DDimer and Troponin level x 1 was negative.    Here for FU. She notes progressively worsening DOE and exercise tolerance over the past 6-8 mos.  She has a long hx of chest tightness that occurs with stress.  She denies any significant changes.  She can have symptoms with exertion.  She denies orthopnea, PND, edema.  She denies syncope. Palpitations are controlled. She has not been able to wear her CPAP.  She does awaken at times gasping (?apnea).  Denies fever, cough, bleeding.     Past Medical History  Diagnosis Date  . Restless leg syndrome   . Hypertension   . PAF (paroxysmal atrial fibrillation) ([place])     a. 1 documented episode in 2008.  Marland Kitchen Palpitations     a. 08/2013 event monitor: no events but was noncompliant;  b. metoprolol added 09/2013.  Marland Kitchen Sleep apnea   . Depression   . Peptic ulcer disease   . GERD (gastroesophageal reflux disease)   . Barrett esophagus   . Avascular necrosis ([place])   . Osteoarthritis     a. knee  . Hepatic cyst   . Hyperlipidemia   . Blood transfusion without reported diagnosis     WITH  HIP REPLACEMENT 06,07  . Cataract   . Spinal stenosis   . Atypical chest pain     a. 09/2013 Admitted -> Negative stress echo, EF 60%, no rwma.  . Vitamin D deficiency     Past Surgical History  Procedure Laterality Date  . Hip fracture  surgery      Bilateral hip replacements  . Orif wrist fracture  09/14/2011    Procedure: OPEN REDUCTION INTERNAL FIXATION (ORIF) WRIST FRACTURE;  Surgeon: Willa Frater III;  Location: [facility];  Service: Orthopedics;  Laterality: N/A;  . Cholecystectomy    . Tubal ligation    . Decompression core hip      bilateral hips  . Breast reduction surgery    . Colonoscopy      Current Medications: Outpatient Prescriptions Prior to Visit  Medication Sig Dispense Refill  . acetaminophen (TYLENOL) 325 MG tablet Take 2 tablets (650 mg total) by mouth every 6 (six) hours as needed for mild pain (or Fever >/= 101).    Marland Kitchen aspirin EC 81 MG tablet Take 1 tablet (81 mg total) by mouth daily. 30 tablet 3  . ergocalciferol (VITAMIN D2) 50000 UNITS capsule Take 1 capsule (50,000 Units total) by mouth once a week. 12 capsule 0  . lisinopril (PRINIVIL,ZESTRIL) 5 MG tablet Take 1 tablet (5 mg total) by mouth daily. 90 tablet 1  . MAGNESIUM PO Take 1 tablet by mouth daily.    . Multiple Vitamin (MULTIVITAMIN) tablet Take 1 tablet by mouth daily.    Marland Kitchen  naproxen sodium (ANAPROX) 220 MG tablet Take 220 mg by mouth 2 (two) times daily as needed (arthritis pain).     Marland Kitchen rOPINIRole (REQUIP) 1 MG tablet Take 1 tablet (1 mg total) by mouth at bedtime. 30 tablet 0  . sertraline (ZOLOFT) 100 MG tablet Take 1 tablet (100 mg total) by mouth daily. 30 tablet 5  . metoprolol succinate (TOPROL-XL) 50 MG 24 hr tablet take 1 tablet by mouth WITH OR IMMEDIATELY FOLLOWING A MEAL daily 15 tablet 0  . omeprazole (PRILOSEC) 40 MG capsule Take 1 capsule (40 mg total) by mouth daily as needed. (Patient not taking: Reported on 10/20/2015) 30 capsule 3   No facility-administered medications prior to visit.     Allergies:   Bupropion hcl; Ciprofloxacin; and Penicillins   Social History   Social History  . Marital Status: Divorced    Spouse Name: N/A  . Number of Children: N/A  . Years of Education: N/A   Occupational History  .  retired     Pharmacist, hospital   Social History Main Topics  . Smoking status: Never Smoker   . Smokeless tobacco: Never Used  . Alcohol Use: No  . Drug Use: No  . Sexual Activity: Not Asked   Other Topics Concern  . None   Social History Narrative   Moved from [place], [place]   retired Education officer, museum   Lives alone- divorced since 1992   Has some cousins near- one cousin in [place], one in [place]   One son in [place], one son in [place], Mental illness- was incarcerated as a teen.       Family History:  The patient's family history includes Arthritis in her father; Colon cancer in her paternal aunt; Diabetes in her brother; Heart disease in her brother and mother; Hyperlipidemia in her brother; Melanoma in her brother; Stroke in her mother.   ROS:   Please see the history of present illness.    Review of Systems  Cardiovascular: Positive for chest pain, dyspnea on exertion and irregular heartbeat.  Respiratory: Positive for snoring.   Hematologic/Lymphatic: Bruises/bleeds easily.  Musculoskeletal: Positive for back pain, joint pain and joint swelling.  Gastrointestinal: Positive for abdominal pain and diarrhea.  Neurological: Positive for dizziness and loss of balance.  All other systems reviewed and are negative.   Physical Exam:    VS:  BP 120/70 mmHg  Pulse 68  Ht 5\' 7"  (1.702 m)  Wt 196 lb 12.8 oz (89.268 kg)  BMI 30.82 kg/m2   GEN: Well nourished, well developed, in no acute distress HEENT: normal Neck: no JVD, no masses Cardiac: Normal S1/S2, RRR; [id number] systolic murmur RUSB,   no edema;  no carotid bruits,   Respiratory:  clear to auscultation bilaterally; no wheezing, rhonchi or rales GI: soft, nontender, nondistended, + BS MS: no deformity or atrophy Skin: warm and dry, no rash Neuro:  Bilateral strength equal, no focal deficits  Psych: Alert and oriented x 3, normal affect  Wt Readings from Last 3 Encounters:  10/20/15 196 lb 12.8 oz (89.268 kg)  09/30/15  196 lb (88.905 kg)  09/30/15 197 lb 9.6 oz (89.631 kg)      Studies/Labs Reviewed:    EKG:  EKG is  ordered today.  The ekg ordered today demonstrates NSR, HR 68, LAD, septal Q waves, QTc 444 ms, no change from prior tracing.   Recent Labs: 05/23/2015: ALT 10; TSH 3.77 09/30/2015: BUN 29*; Creatinine, Ser 0.90; Hemoglobin 13.3; Platelets 308; Potassium 4.0;  Sodium 139   Recent Lipid Panel    Component Value Date/Time   CHOL 242* 05/23/2015 1011   TRIG 131.0 05/23/2015 1011   HDL 55.40 05/23/2015 1011   CHOLHDL 4 05/23/2015 1011   VLDL 26.2 05/23/2015 1011   LDLCALC 160* 05/23/2015 1011   LDLDIRECT 167.6 10/18/2010 1018    Additional studies/ records that were reviewed today include:   Dg Chest 2 View  09/30/2015   IMPRESSION: Newly seen linear markings of both lung bases which could be scarring or mild basilar atelectasis. Atherosclerosis of the aorta. Mild cardiomegaly. Electronically Signed   By: Nelson Chimes M.D.   On: 09/30/2015 15:32   US Venous Img Lower Unilateral Right   09/30/2015   4.5 x 2.0 x 1.6 cm right popliteal fossa Baker's cyst.  IMPRESSION: No evidence of DVT. Baker's cyst. Electronically Signed   By: Marybelle Killings M.D.   On: 09/30/2015 16:27   ETT-Echo 1/15 Stress echo with no chest pain, no ST changes and nostress induced wall motion abnormalities.  Carotid US 12/14 Normal  Event Monitor 12/14 SR, Sinus brady  Echo 2/14 EF 60-65%, no RWMA, mod TR, PASP 35 mmHg   ASSESSMENT:    1. Shortness of breath   2. Other chest pain   3. Paroxysmal atrial fibrillation (HCC)   4. Essential hypertension   5. Obstructive sleep apnea   6. Hyperlipidemia     PLAN:    In order of problems listed above:  1. Shortness of Breath - She notes worsening dyspnea and decreased ex tolerance.  She has a FHx of CAD and other RFs including HTN, HL.  She did have some scarring on a recent CXR.  O2 sats were 98% on RA.  Suspect DOE from obesity and deconditioning.  However,  she has a systolic murmur. This sounds like aortic sclerosis.     -  Arrange Echo  -  Arrange ETT-Myoview   2. Chest pain - Somewhat atypical. But her DOE is worse. Will arrange ETT-Myoview as noted.   3. PAF - No apparent recurrence.  CHADS2-VASc=3.  Will need anticoag if recurrence documented.    4. HTN - Controlled.   5. OSA - Unable to wear CPAP.  Will refer to Dr. Fransico Him for follow up.  6. HL - Managed by PCP.  LDL in 8/16 160.  Consider statin Rx.     Medication Adjustments/Labs and Tests Ordered: Current medicines are reviewed at length with the patient today.  Concerns regarding medicines are outlined above.  Medication changes, Labs and Tests ordered today are outlined in the Patient Instructions noted below.   Patient Instructions  Medication Instructions:  1. A REFILL FOR TOPROL XL HAS BEEN SENT IN  Labwork: NONE  Testing/Procedures: 1. Your physician has requested that you have an echocardiogram. Echocardiography is a painless test that uses sound waves to create images of your heart. It provides your doctor with information about the size and shape of your heart and how well your heart's chambers and valves are working. This procedure takes approximately one hour. There are no restrictions for this procedure.  2. Your physician has requested that you have en exercise stress myoview. For further information please visit [email or web address]. Please follow instruction sheet, as given.    Follow-Up: DR. Meda Coffee IN 3 MONTHS  YOU ARE BEING REFERRED TO DR. Radford Pax FOR SLEEP APNEA Any Other Special Instructions Will Be Listed Below (If Applicable).  If you need a refill on your cardiac medications  before your next appointment, please call your pharmacy.     Signed, Richardson Dopp, PA-C  10/20/2015 5:16 PM    [facility] [address], [place], [place]  [address] Phone: [phone]; Fax: [phone]

## 2015-10-20 ENCOUNTER — Encounter: Payer: Self-pay | Admitting: Physician Assistant

## 2015-10-20 ENCOUNTER — Other Ambulatory Visit: Payer: Self-pay | Admitting: Family

## 2015-10-20 ENCOUNTER — Ambulatory Visit (INDEPENDENT_AMBULATORY_CARE_PROVIDER_SITE_OTHER): Payer: Medicare Other | Admitting: Physician Assistant

## 2015-10-20 VITALS — BP 120/70 | HR 68 | Ht 67.0 in | Wt 196.8 lb

## 2015-10-20 DIAGNOSIS — R0789 Other chest pain: Secondary | ICD-10-CM | POA: Diagnosis not present

## 2015-10-20 DIAGNOSIS — I10 Essential (primary) hypertension: Secondary | ICD-10-CM

## 2015-10-20 DIAGNOSIS — I480 Paroxysmal atrial fibrillation: Secondary | ICD-10-CM | POA: Diagnosis not present

## 2015-10-20 DIAGNOSIS — R0602 Shortness of breath: Secondary | ICD-10-CM

## 2015-10-20 DIAGNOSIS — G4733 Obstructive sleep apnea (adult) (pediatric): Secondary | ICD-10-CM

## 2015-10-20 DIAGNOSIS — E785 Hyperlipidemia, unspecified: Secondary | ICD-10-CM

## 2015-10-20 MED ORDER — METOPROLOL SUCCINATE ER 50 MG PO TB24: 50.0000 mg | ORAL_TABLET | Freq: Every day | ORAL | Status: DC

## 2015-10-20 NOTE — Telephone Encounter (Signed)
Patient requesting refill on requip last office visit 09/30/15 please advise?

## 2015-10-20 NOTE — Patient Instructions (Signed)
Medication Instructions:  1. A REFILL FOR TOPROL XL HAS BEEN SENT IN  Labwork: NONE  Testing/Procedures: 1. Your physician has requested that you have an echocardiogram. Echocardiography is a painless test that uses sound waves to create images of your heart. It provides your doctor with information about the size and shape of your heart and how well your heart's chambers and valves are working. This procedure takes approximately one hour. There are no restrictions for this procedure.  2. Your physician has requested that you have en exercise stress myoview. For further information please visit [email or web address]. Please follow instruction sheet, as given.    Follow-Up: DR. Meda Coffee IN 3 MONTHS  YOU ARE BEING REFERRED TO DR. Radford Pax FOR SLEEP APNEA Any Other Special Instructions Will Be Listed Below (If Applicable).  If you need a refill on your cardiac medications before your next appointment, please call your pharmacy.

## 2015-11-01 ENCOUNTER — Telehealth (HOSPITAL_COMMUNITY): Payer: Self-pay | Admitting: *Deleted

## 2015-11-01 NOTE — Telephone Encounter (Signed)
Left message on voicemail per DPR in reference to upcoming appointment scheduled on 11/04/15 at 0915 with detailed instructions given per Myocardial Perfusion Study Information Sheet for the test. LM to arrive 15 minutes early, and that it is imperative to arrive on time for appointment to keep from having the test rescheduled. If you need to cancel or reschedule your appointment, please call the office within 24 hours of your appointment. Failure to do so may result in a cancellation of your appointment, and a $50 no show fee. Phone number given for call back for any questions. Darek Eifler, Ranae Palms

## 2015-11-04 ENCOUNTER — Encounter (HOSPITAL_COMMUNITY): Payer: Medicare Other

## 2015-11-04 ENCOUNTER — Other Ambulatory Visit (HOSPITAL_COMMUNITY): Payer: Medicare Other

## 2015-12-22 ENCOUNTER — Ambulatory Visit: Payer: Medicare Other | Admitting: Cardiology

## 2016-01-13 ENCOUNTER — Other Ambulatory Visit: Payer: Self-pay | Admitting: Family

## 2016-01-13 NOTE — Telephone Encounter (Signed)
Rx request to pharmacy/SLS Requested drug refills are authorized, however, the patient needs further evaluation and/or laboratory testing before further refills are given. Ask her to make an appointment for this.  

## 2016-01-25 ENCOUNTER — Ambulatory Visit: Payer: Medicare Other | Admitting: Cardiology

## 2016-02-08 ENCOUNTER — Encounter: Payer: Self-pay | Admitting: Cardiology

## 2016-02-08 ENCOUNTER — Telehealth: Payer: Self-pay

## 2016-02-08 NOTE — Telephone Encounter (Signed)
Patient is on my [employer] list for 2017. AWV due around 04/2016

## 2016-02-24 ENCOUNTER — Other Ambulatory Visit: Payer: Self-pay | Admitting: Family

## 2016-02-24 NOTE — Telephone Encounter (Signed)
2 week supply of Sertraline sent to pharmacy. Pt  Last seen by PCP in 04/2015 and advised a 6 month f/u (10/2015). Pt is past due and needs OV before further refills can be given. Mychart sent to pt.

## 2016-03-14 ENCOUNTER — Ambulatory Visit (INDEPENDENT_AMBULATORY_CARE_PROVIDER_SITE_OTHER): Payer: Medicare Other | Admitting: Family

## 2016-03-14 ENCOUNTER — Telehealth: Payer: Self-pay | Admitting: *Deleted

## 2016-03-14 ENCOUNTER — Encounter: Payer: Self-pay | Admitting: Family

## 2016-03-14 VITALS — BP 149/72 | HR 64 | Temp 98.2°F | Resp 16 | Ht 67.0 in | Wt 191.6 lb

## 2016-03-14 DIAGNOSIS — E559 Vitamin D deficiency, unspecified: Secondary | ICD-10-CM | POA: Diagnosis not present

## 2016-03-14 DIAGNOSIS — Z1159 Encounter for screening for other viral diseases: Secondary | ICD-10-CM | POA: Diagnosis not present

## 2016-03-14 DIAGNOSIS — I10 Essential (primary) hypertension: Secondary | ICD-10-CM | POA: Diagnosis not present

## 2016-03-14 DIAGNOSIS — F329 Major depressive disorder, single episode, unspecified: Secondary | ICD-10-CM

## 2016-03-14 DIAGNOSIS — G2581 Restless legs syndrome: Secondary | ICD-10-CM

## 2016-03-14 DIAGNOSIS — F32A Depression, unspecified: Secondary | ICD-10-CM

## 2016-03-14 MED ORDER — LISINOPRIL 5 MG PO TABS: 5.0000 mg | ORAL_TABLET | Freq: Every day | ORAL | Status: DC

## 2016-03-14 MED ORDER — SERTRALINE HCL 100 MG PO TABS: 100.0000 mg | ORAL_TABLET | Freq: Every day | ORAL | Status: AC

## 2016-03-14 MED ORDER — ROPINIROLE HCL 1 MG PO TABS: 1.0000 mg | ORAL_TABLET | Freq: Every day | ORAL | Status: DC

## 2016-03-14 NOTE — Progress Notes (Signed)
Pre visit review using our clinic review tool, if applicable. No additional management support is needed unless otherwise documented below in the visit note. 

## 2016-03-14 NOTE — Assessment & Plan Note (Signed)
Reports good mood.  There is question of BPD. She is working with therapist and has plans to establish with psychiatry. Refill provided for sertraline.  Defer med changes to psychiatry.

## 2016-03-14 NOTE — Assessment & Plan Note (Signed)
Stable on requip. Could consider trial of mirapex in the future if she feels too bothered by side effect of the Requip.

## 2016-03-14 NOTE — Telephone Encounter (Signed)
Pt's last Prolia injection was 07/2015.  Can you initiate insurance verification?

## 2016-03-14 NOTE — Assessment & Plan Note (Signed)
Blood pressure in the office x last 2 visit is at goal for her age. Continue current meds.

## 2016-03-14 NOTE — Patient Instructions (Addendum)
Please complete lab work prior to leaving.   

## 2016-03-14 NOTE — Progress Notes (Signed)
Subjective:    Patient ID: Cassandra Miles, female    DOB: 09-20-1941, 75 y.o.   MRN: [id number]  HPI   Cassandra Miles is a 75 yr old female who presents today for follow up.  1) HTN- current bp meds include lisinopril 5mg , toprol xl 50mg . Reports BP often in the 150's at home.   BP Readings from Last 3 Encounters:  03/14/16 149/72  10/20/15 120/70  09/30/15 168/85   2) Depression- maintained on zoloft 100mg . Reports mood is good. She has started seeing a Social worker. Was told that she may have bipolar 2 disorder.  She is being seen at [facility].  She will be seeing the PA there to discuss medication.   3) Vit D deficiency- Complete additional 12 week of weekly vit D but completed several months ago and not taking supplement now.    4) RLS- reports that she continues requip but that it makes her feel "funny for about an hour." Requip controls her RLS. Reports that is does make her sleepy. Does not want to change medication.  Review of Systems  Respiratory: Negative for shortness of breath.   Cardiovascular: Negative for chest pain and leg swelling.     Past Medical History  Diagnosis Date  . Restless leg syndrome   . Hypertension   . PAF (paroxysmal atrial fibrillation) ([place])     a. 1 documented episode in 2008.  Marland Kitchen Palpitations     a. 08/2013 event monitor: no events but was noncompliant;  b. metoprolol added 09/2013.  Marland Kitchen Sleep apnea   . Depression   . Peptic ulcer disease   . GERD (gastroesophageal reflux disease)   . Barrett esophagus   . Avascular necrosis ([place])   . Osteoarthritis     a. knee  . Hepatic cyst   . Hyperlipidemia   . Blood transfusion without reported diagnosis     WITH  HIP REPLACEMENT 06,07  . Cataract   . Spinal stenosis   . Atypical chest pain     a. 09/2013 Admitted -> Negative stress echo, EF 60%, no rwma.  . Vitamin D deficiency      Social History   Social History  . Marital Status: Divorced    Spouse Name: N/A  . Number of  Children: N/A  . Years of Education: N/A   Occupational History  . retired     Pharmacist, hospital   Social History Main Topics  . Smoking status: Never Smoker   . Smokeless tobacco: Never Used  . Alcohol Use: No  . Drug Use: No  . Sexual Activity: Not on file   Other Topics Concern  . Not on file   Social History Narrative   Moved from [place], [place]   retired Education officer, museum   Lives alone- divorced since 1992   Has some cousins near- one cousin in [place], one in [place]   One son in [place], one son in [place], Mental illness- was incarcerated as a teen.      Past Surgical History  Procedure Laterality Date  . Hip fracture surgery      Bilateral hip replacements  . Orif wrist fracture  09/14/2011    Procedure: OPEN REDUCTION INTERNAL FIXATION (ORIF) WRIST FRACTURE;  Surgeon: Willa Frater III;  Location: [facility];  Service: Orthopedics;  Laterality: N/A;  . Cholecystectomy    . Tubal ligation    . Decompression core hip      bilateral hips  . Breast reduction surgery    .  Colonoscopy      Family History  Problem Relation Age of Onset  . Arthritis Father   . Stroke Mother     x2  . Heart disease Mother   . Heart disease Brother     Stenting   . Hyperlipidemia Brother   . Melanoma Brother   . Colon cancer Paternal Aunt     x 2  . Diabetes Brother     Allergies  Allergen Reactions  . Bupropion Hcl Other (See Comments)    Made her feel bad  . Ciprofloxacin Nausea And Vomiting    REACTION: hives  . Penicillins Rash    REACTION: rash    Current Outpatient Prescriptions on File Prior to Visit  Medication Sig Dispense Refill  . acetaminophen (TYLENOL) 325 MG tablet Take 2 tablets (650 mg total) by mouth every 6 (six) hours as needed for mild pain (or Fever >/= 101).    Marland Kitchen aspirin EC 81 MG tablet Take 1 tablet (81 mg total) by mouth daily. 30 tablet 3  . lisinopril (PRINIVIL,ZESTRIL) 5 MG tablet Take 1 tablet (5 mg total) by mouth daily. 90 tablet 1  .  MAGNESIUM PO Take 1 tablet by mouth daily.    . metoprolol succinate (TOPROL-XL) 50 MG 24 hr tablet Take 1 tablet (50 mg total) by mouth daily. Take with or immediately following a meal. 30 tablet 11  . Multiple Vitamin (MULTIVITAMIN) tablet Take 1 tablet by mouth daily.    . naproxen sodium (ANAPROX) 220 MG tablet Take 220 mg by mouth 2 (two) times daily as needed (arthritis pain).     Marland Kitchen omeprazole (PRILOSEC) 40 MG capsule Take 40 mg by mouth daily as needed (AS NEEDED FOR ACID REFLUX).    Marland Kitchen rOPINIRole (REQUIP) 1 MG tablet take 1 tablet by mouth at bedtime 30 tablet 0  . sertraline (ZOLOFT) 100 MG tablet take 1 tablet by mouth once daily 15 tablet 0   No current facility-administered medications on file prior to visit.    BP 149/72 mmHg  Pulse 64  Temp(Src) 98.2 F (36.8 C) (Oral)  Resp 16  Ht 5\' 7"  (1.702 m)  Wt 191 lb 9.6 oz (86.909 kg)  BMI 30.00 kg/m2  SpO2 98%       Objective:   Physical Exam  Constitutional: She appears well-developed and well-nourished.  Cardiovascular: Normal rate, regular rhythm and normal heart sounds.   No murmur heard. Pulmonary/Chest: Effort normal and breath sounds normal. No respiratory distress. She has no wheezes.  Psychiatric: She has a normal mood and affect. Her behavior is normal. Judgment and thought content normal.          Assessment & Plan:

## 2016-03-14 NOTE — Assessment & Plan Note (Signed)
Check follow up vit D level.  

## 2016-03-15 ENCOUNTER — Telehealth: Payer: Self-pay | Admitting: *Deleted

## 2016-03-15 ENCOUNTER — Other Ambulatory Visit: Payer: Self-pay | Admitting: Family

## 2016-03-15 DIAGNOSIS — E559 Vitamin D deficiency, unspecified: Secondary | ICD-10-CM

## 2016-03-15 LAB — BASIC METABOLIC PANEL
BUN: 13 mg/dL (ref 6–23)
CHLORIDE: 105 meq/L (ref 96–112)
CO2: 29 mEq/L (ref 19–32)
Calcium: 9 mg/dL (ref 8.4–10.5)
Creatinine, Ser: 0.91 mg/dL (ref 0.40–1.20)
GFR: 64.33 mL/min (ref >60.00)
GLUCOSE: 91 mg/dL (ref 70–99)
POTASSIUM: 3.8 meq/L (ref 3.5–5.1)
SODIUM: 138 meq/L (ref 135–145)

## 2016-03-15 LAB — HEPATITIS C ANTIBODY: HCV Ab: NEGATIVE

## 2016-03-15 LAB — VITAMIN D 25 HYDROXY (VIT D DEFICIENCY, FRACTURES): VITD: 22.21 ng/mL — ABNORMAL LOW (ref 30.00–100.00)

## 2016-03-15 MED ORDER — VITAMIN D (ERGOCALCIFEROL) 1.25 MG (50000 UNIT) PO CAPS: 50000.0000 [IU] | ORAL_CAPSULE | ORAL | Status: DC

## 2016-03-15 NOTE — Telephone Encounter (Addendum)
Message sent via mychart.   ----- Message from Debbrah Alar, NP sent at 03/15/2016 12:52 PM EDT ----- Vit D is low, repeat vit D 5000iu once daily for 12 weeks, repeat vit D in 12 weeks. Kidney function, electrolytes, sugar are normal.  Hep C testing is negative.

## 2016-04-02 ENCOUNTER — Ambulatory Visit (INDEPENDENT_AMBULATORY_CARE_PROVIDER_SITE_OTHER): Payer: Medicare Other | Admitting: Family

## 2016-04-02 ENCOUNTER — Encounter: Payer: Self-pay | Admitting: Family

## 2016-04-02 VITALS — BP 130/70 | HR 67 | Temp 98.1°F | Ht 67.0 in | Wt 193.0 lb

## 2016-04-02 DIAGNOSIS — R2681 Unsteadiness on feet: Secondary | ICD-10-CM

## 2016-04-02 DIAGNOSIS — M109 Gout, unspecified: Secondary | ICD-10-CM | POA: Diagnosis not present

## 2016-04-02 MED ORDER — COLCHICINE 0.6 MG PO TABS: ORAL_TABLET | ORAL | Status: DC

## 2016-04-02 NOTE — Patient Instructions (Signed)
You will be contacted about your referral to physical therapy.  Please begin colchicine tonight.  Let me know if your toe pain/redness worsens or if it does not improve.

## 2016-04-02 NOTE — Telephone Encounter (Signed)
Noted.  Message routed to [facility] and Rachel.

## 2016-04-02 NOTE — Telephone Encounter (Signed)
Note:  Pt has an appt today and needs fall and depression screening.

## 2016-04-02 NOTE — Progress Notes (Signed)
Pre visit review using our clinic review tool, if applicable. No additional management support is needed unless otherwise documented below in the visit note. 

## 2016-04-02 NOTE — Progress Notes (Signed)
Subjective:    Patient ID: Cassandra Miles, female    DOB: 12/10/1940, 75 y.o.   MRN: [id number]  HPI  Cassandra Miles is a 75 yr old female who presents today with complaint of pain left great toe. Reports that last Tuesday she fell and tripped. She fell and hit her forehead. She did not lose consciousness.  Reports that her left foot dragged behind her. The next day her left great toe became red and swollen and painful. She reports previous history of gout.   She does report that she falls a lot. She feels that her balance is poor.   Review of Systems See HPI  Past Medical History  Diagnosis Date  . Restless leg syndrome   . Hypertension   . PAF (paroxysmal atrial fibrillation) ([place])     a. 1 documented episode in 2008.  Marland Kitchen Palpitations     a. 08/2013 event monitor: no events but was noncompliant;  b. metoprolol added 09/2013.  Marland Kitchen Sleep apnea   . Depression   . Peptic ulcer disease   . GERD (gastroesophageal reflux disease)   . Barrett esophagus   . Avascular necrosis ([place])   . Osteoarthritis     a. knee  . Hepatic cyst   . Hyperlipidemia   . Blood transfusion without reported diagnosis     WITH  HIP REPLACEMENT 06,07  . Cataract   . Spinal stenosis   . Atypical chest pain     a. 09/2013 Admitted -> Negative stress echo, EF 60%, no rwma.  . Vitamin D deficiency      Social History   Social History  . Marital Status: Divorced    Spouse Name: N/A  . Number of Children: N/A  . Years of Education: N/A   Occupational History  . retired     Pharmacist, hospital   Social History Main Topics  . Smoking status: Never Smoker   . Smokeless tobacco: Never Used  . Alcohol Use: No  . Drug Use: No  . Sexual Activity: Not on file   Other Topics Concern  . Not on file   Social History Narrative   Moved from [place], [place]   retired Education officer, museum   Lives alone- divorced since 1992   Has some cousins near- one cousin in [place], one in [place]   One son in [place], one son in [place], Mental illness- was incarcerated as a teen.      Past Surgical History  Procedure Laterality Date  . Hip fracture surgery      Bilateral hip replacements  . Orif wrist fracture  09/14/2011    Procedure: OPEN REDUCTION INTERNAL FIXATION (ORIF) WRIST FRACTURE;  Surgeon: Willa Frater III;  Location: [facility];  Service: Orthopedics;  Laterality: N/A;  . Cholecystectomy    . Tubal ligation    . Decompression core hip      bilateral hips  . Breast reduction surgery    . Colonoscopy      Family History  Problem Relation Age of Onset  . Arthritis Father   . Stroke Mother     x2  . Heart disease Mother   . Heart disease Brother     Stenting   . Hyperlipidemia Brother   . Melanoma Brother   . Colon cancer Paternal Aunt     x 2  . Diabetes Brother     Allergies  Allergen Reactions  . Bupropion Hcl Other (See Comments)    Made her feel  bad  . Ciprofloxacin Nausea And Vomiting    REACTION: hives  . Penicillins Rash    REACTION: rash    Current Outpatient Prescriptions on File Prior to Visit  Medication Sig Dispense Refill  . acetaminophen (TYLENOL) 325 MG tablet Take 2 tablets (650 mg total) by mouth every 6 (six) hours as needed for mild pain (or Fever >/= 101).    Marland Kitchen aspirin EC 81 MG tablet Take 1 tablet (81 mg total) by mouth daily. 30 tablet 3  . lisinopril (PRINIVIL,ZESTRIL) 5 MG tablet Take 1 tablet (5 mg total) by mouth daily. 90 tablet 1  . MAGNESIUM PO Take 1 tablet by mouth daily.    . metoprolol succinate (TOPROL-XL) 50 MG 24 hr tablet Take 1 tablet (50 mg total) by mouth daily. Take with or immediately following a meal. 30 tablet 11  . Multiple Vitamin (MULTIVITAMIN) tablet Take 1 tablet by mouth daily.    . naproxen sodium (ANAPROX) 220 MG tablet Take 220 mg by mouth 2 (two) times daily as needed (arthritis pain).     Marland Kitchen omeprazole (PRILOSEC) 40 MG capsule Take 40 mg by mouth daily as needed (AS NEEDED FOR ACID REFLUX).    Marland Kitchen rOPINIRole (REQUIP) 1 MG tablet  Take 1 tablet (1 mg total) by mouth at bedtime. 30 tablet 5  . sertraline (ZOLOFT) 100 MG tablet Take 1 tablet (100 mg total) by mouth daily. 30 tablet 5  . Vitamin D, Ergocalciferol, (DRISDOL) 50000 units CAPS capsule Take 1 capsule (50,000 Units total) by mouth every 7 (seven) days. 12 capsule 0   No current facility-administered medications on file prior to visit.    BP 130/70 mmHg  Pulse 67  Temp(Src) 98.1 F (36.7 C) (Oral)  Ht 5\' 7"  (1.702 m)  Wt 193 lb (87.544 kg)  BMI 30.22 kg/m2  SpO2 98%       Objective:   Physical Exam  Constitutional: She appears well-developed and well-nourished.  Cardiovascular: Normal rate, regular rhythm and normal heart sounds.   No murmur heard. Pulmonary/Chest: Effort normal and breath sounds normal. No respiratory distress. She has no wheezes.  Musculoskeletal:  L great toe is erythematous with mild swelling.   Psychiatric: She has a normal mood and affect. Her behavior is normal. Judgment and thought content normal.          Assessment & Plan:  1) Gout- check uric acid level, rx with colchicine.    2) gait instability- will refer for physical therapy for gait training.

## 2016-04-02 NOTE — Telephone Encounter (Signed)
I have electronically submitted pt's info for [employer] insurance verification and will notify you once I have a response. Thank you. °

## 2016-04-02 NOTE — Telephone Encounter (Signed)
Noted. Will discuss at her visit.  

## 2016-04-03 ENCOUNTER — Telehealth: Payer: Self-pay | Admitting: Family

## 2016-04-03 DIAGNOSIS — M79675 Pain in left toe(s): Secondary | ICD-10-CM

## 2016-04-03 LAB — URIC ACID: Uric Acid, Serum: 5.1 mg/dL (ref 2.4–7.0)

## 2016-04-03 NOTE — Telephone Encounter (Signed)
Uric acid level looks ok. Might not be gout. If she is still having pain/swelling in her toe, I would like her to complete x ray of her foot. Pended order below.

## 2016-04-03 NOTE — Telephone Encounter (Signed)
Called and [facility] @ 5:10pm @ [phone]) asking the pt to RTC regarding lab results and note.//AB/CMA

## 2016-04-03 NOTE — Telephone Encounter (Signed)
See mychart.  

## 2016-04-04 NOTE — Telephone Encounter (Signed)
Called and spoke with the pt and informed her of recent lab results and note.  Pt verbalized understanding.  Pt stated that her foot is not any better.  Still painful and swollen.   Pt agreed to have the x-ray done.  X-ray sent.  Informed the pt she did not need to schedule an appt she can come and get the x-ray done when it's good for her.  Pt agreed.//AB/CMA

## 2016-04-04 NOTE — Telephone Encounter (Signed)
Patient returning your call.

## 2016-04-05 ENCOUNTER — Telehealth: Payer: Self-pay | Admitting: Family

## 2016-04-05 ENCOUNTER — Ambulatory Visit (HOSPITAL_BASED_OUTPATIENT_CLINIC_OR_DEPARTMENT_OTHER)
Admission: RE | Admit: 2016-04-05 | Discharge: 2016-04-05 | Disposition: A | Discharge status: Home / Self Care | Payer: Medicare Other | Source: Ambulatory Visit | Attending: Family | Admitting: Family

## 2016-04-05 ENCOUNTER — Telehealth: Payer: Self-pay

## 2016-04-05 DIAGNOSIS — M79675 Pain in left toe(s): Secondary | ICD-10-CM

## 2016-04-05 DIAGNOSIS — R937 Abnormal findings on diagnostic imaging of other parts of musculoskeletal system: Secondary | ICD-10-CM | POA: Insufficient documentation

## 2016-04-05 NOTE — Telephone Encounter (Signed)
Patient called for results of XR of foot. Reviewd notes with patient. States she has appointment scheduled with Sports medicine for tomorrow.

## 2016-04-05 NOTE — Telephone Encounter (Signed)
LMOM informing Pt to return call.  

## 2016-04-05 NOTE — Telephone Encounter (Signed)
Please let patient know that I reviewed her x ray. X ray is inconclusive but suggests possible fracture of one of the small bones in the great toe. I would like for her to meet with Dr. Barbaraann Barthel (sports medicine) for further evaluation.

## 2016-04-06 ENCOUNTER — Encounter: Payer: Self-pay | Admitting: Family Medicine

## 2016-04-06 ENCOUNTER — Ambulatory Visit (INDEPENDENT_AMBULATORY_CARE_PROVIDER_SITE_OTHER): Payer: Medicare Other | Admitting: Family Medicine

## 2016-04-06 VITALS — BP 141/85 | HR 66 | Ht 67.0 in | Wt 190.0 lb

## 2016-04-06 DIAGNOSIS — M79675 Pain in left toe(s): Secondary | ICD-10-CM | POA: Diagnosis not present

## 2016-04-06 MED ORDER — DICLOFENAC SODIUM 75 MG PO TBEC: 75.0000 mg | DELAYED_RELEASE_TABLET | Freq: Two times a day (BID) | ORAL | Status: DC

## 2016-04-06 NOTE — Telephone Encounter (Signed)
Spoke with patient regarding XR results and referral. Patient states she did get call regarding refferal and appointment date.

## 2016-04-06 NOTE — Patient Instructions (Signed)
Your pain is either due to arthritis or an acute gout flare of the IP joint of your foot. Ice the area 15 minutes at a time 3-4 times a day at least. Diclofenac 75mg  twice a day with food for pain and inflammation until this has resolved (generally 7-10 days then as needed). Colchicine is a consideration (what Melissa sent in) but I would try the diclofenac first. Buddy taping, a hard soled shoe are other things you can do - I don't think you need these right now. Follow up with me in 2 weeks or as needed.

## 2016-04-10 DIAGNOSIS — M79676 Pain in unspecified toe(s): Secondary | ICD-10-CM | POA: Insufficient documentation

## 2016-04-10 NOTE — Progress Notes (Signed)
PCP and consultation requested by: Nance Pear., NP  Subjective:   HPI: Patient is a 75 y.o. female here for left great toe pain.  Patient reports she started to get swelling, redness, pain in left great toe the day after she drove 8 hours (occurred on 7/3). Difficulty moving this toe.  Has history of gout. She recalls also having tripped with her foot caught under a door but this did not immediately precede her pain and swelling. Pain level is 3/10, more dull now. Warmth associated with this. She was given colchicine but she hasn't filled it. No numbness, other issues.  Past Medical History  Diagnosis Date  . Restless leg syndrome   . Hypertension   . PAF (paroxysmal atrial fibrillation) ([place])     a. 1 documented episode in 2008.  Marland Kitchen Palpitations     a. 08/2013 event monitor: no events but was noncompliant;  b. metoprolol added 09/2013.  Marland Kitchen Sleep apnea   . Depression   . Peptic ulcer disease   . GERD (gastroesophageal reflux disease)   . Barrett esophagus   . Avascular necrosis ([place])   . Osteoarthritis     a. knee  . Hepatic cyst   . Hyperlipidemia   . Blood transfusion without reported diagnosis     WITH  HIP REPLACEMENT 06,07  . Cataract   . Spinal stenosis   . Atypical chest pain     a. 09/2013 Admitted -> Negative stress echo, EF 60%, no rwma.  . Vitamin D deficiency     Current Outpatient Prescriptions on File Prior to Visit  Medication Sig Dispense Refill  . acetaminophen (TYLENOL) 325 MG tablet Take 2 tablets (650 mg total) by mouth every 6 (six) hours as needed for mild pain (or Fever >/= 101).    Marland Kitchen aspirin EC 81 MG tablet Take 1 tablet (81 mg total) by mouth daily. 30 tablet 3  . colchicine 0.6 MG tablet 2 tabs by mouth now,then 1 tab one hour later 6 tablet 0  . lisinopril (PRINIVIL,ZESTRIL) 5 MG tablet Take 1 tablet (5 mg total) by mouth daily. 90 tablet 1  . MAGNESIUM PO Take 1 tablet by mouth daily.    . metoprolol succinate (TOPROL-XL) 50 MG 24 hr  tablet Take 1 tablet (50 mg total) by mouth daily. Take with or immediately following a meal. 30 tablet 11  . Multiple Vitamin (MULTIVITAMIN) tablet Take 1 tablet by mouth daily.    Marland Kitchen omeprazole (PRILOSEC) 40 MG capsule Take 40 mg by mouth daily as needed (AS NEEDED FOR ACID REFLUX).    Marland Kitchen rOPINIRole (REQUIP) 1 MG tablet Take 1 tablet (1 mg total) by mouth at bedtime. 30 tablet 5  . sertraline (ZOLOFT) 100 MG tablet Take 1 tablet (100 mg total) by mouth daily. 30 tablet 5  . Vitamin D, Ergocalciferol, (DRISDOL) 50000 units CAPS capsule Take 1 capsule (50,000 Units total) by mouth every 7 (seven) days. 12 capsule 0   No current facility-administered medications on file prior to visit.    Past Surgical History  Procedure Laterality Date  . Hip fracture surgery      Bilateral hip replacements  . Orif wrist fracture  09/14/2011    Procedure: OPEN REDUCTION INTERNAL FIXATION (ORIF) WRIST FRACTURE;  Surgeon: Willa Frater III;  Location: [facility];  Service: Orthopedics;  Laterality: N/A;  . Cholecystectomy    . Tubal ligation    . Decompression core hip      bilateral hips  . Breast  reduction surgery    . Colonoscopy      Allergies  Allergen Reactions  . Bupropion Hcl Other (See Comments)    Made her feel bad  . Ciprofloxacin Nausea And Vomiting    REACTION: hives  . Penicillins Rash    REACTION: rash    Social History   Social History  . Marital Status: Divorced    Spouse Name: N/A  . Number of Children: N/A  . Years of Education: N/A   Occupational History  . retired     Pharmacist, hospital   Social History Main Topics  . Smoking status: Never Smoker   . Smokeless tobacco: Never Used  . Alcohol Use: No  . Drug Use: No  . Sexual Activity: Not on file   Other Topics Concern  . Not on file   Social History Narrative   Moved from [place], [place]   retired Education officer, museum   Lives alone- divorced since 1992   Has some cousins near- one cousin in [place], one in [place]   One son in  [place], one son in [place], Mental illness- was incarcerated as a teen.      Family History  Problem Relation Age of Onset  . Arthritis Father   . Stroke Mother     x2  . Heart disease Mother   . Heart disease Brother     Stenting   . Hyperlipidemia Brother   . Melanoma Brother   . Colon cancer Paternal Aunt     x 2  . Diabetes Brother     BP 141/85 mmHg  Pulse 66  Ht 5\' 7"  (1.702 m)  Wt 190 lb (86.183 kg)  BMI 29.75 kg/m2  Review of Systems: See HPI above.    Objective:  Physical Exam:  Gen: NAD, comfortable in exam room  Left foot: Redness but no warmth over IP joint of great toe.  No malrotation or angulation.   Minimal TTP IP joint great toe.  No MTP, other tenderness. FROM MTP.  Mild limitation flexion of IP joint. Collateral ligaments intact of 1st digit. NVI distally.    Assessment & Plan:  1. Left great toe pain - at IP joint with redness, consistent with acute gout flare (normal uric acid level can be seen in up to 43% of people with acute flare), less likely arthritis flare.  Start with icing, diclofenac twice a day with food.  F/u in 2 weeks or as needed.

## 2016-04-10 NOTE — Assessment & Plan Note (Signed)
at IP joint with redness, consistent with acute gout flare (normal uric acid level can be seen in up to 43% of people with acute flare), less likely arthritis flare.  Start with icing, diclofenac twice a day with food.  F/u in 2 weeks or as needed.

## 2016-04-12 NOTE — Telephone Encounter (Signed)
I mistakenly submitted the info to [employer] using Debbrah Alar as the provider [facility] will not pay for a NP).  My apologies to you and the patient.  I have electronically re-submitted it under Dr. Charlett Blake and will notify you once I have a response.  Again, my sincerest apologies.  Thank you!

## 2016-04-12 NOTE — Telephone Encounter (Signed)
Attempted to notify pt and left message to check mychart acct. Message sent.

## 2016-04-16 NOTE — Telephone Encounter (Signed)
According to Ms. Caridi's insurance, [employer] is not covered under her medical benefits.  Please advise her to check w/her pharmacy if she has Part D coverage to see if it would be covered that way.  Please let me know if you have any questions.  Thank you!

## 2016-04-17 NOTE — Telephone Encounter (Signed)
Left message on home # for pt to return my call. 

## 2016-05-04 ENCOUNTER — Ambulatory Visit (INDEPENDENT_AMBULATORY_CARE_PROVIDER_SITE_OTHER): Payer: Medicare Other | Admitting: Family

## 2016-05-04 ENCOUNTER — Encounter: Payer: Self-pay | Admitting: Family

## 2016-05-04 VITALS — BP 130/70 | HR 58 | Temp 97.8°F | Resp 16 | Ht 67.0 in | Wt 190.8 lb

## 2016-05-04 DIAGNOSIS — K439 Ventral hernia without obstruction or gangrene: Secondary | ICD-10-CM | POA: Diagnosis not present

## 2016-05-04 DIAGNOSIS — R197 Diarrhea, unspecified: Secondary | ICD-10-CM | POA: Diagnosis not present

## 2016-05-04 DIAGNOSIS — K625 Hemorrhage of anus and rectum: Secondary | ICD-10-CM | POA: Diagnosis not present

## 2016-05-04 DIAGNOSIS — E559 Vitamin D deficiency, unspecified: Secondary | ICD-10-CM | POA: Diagnosis not present

## 2016-05-04 LAB — VITAMIN D 25 HYDROXY (VIT D DEFICIENCY, FRACTURES): VITD: 20.5 ng/mL — AB (ref 30.00–100.00)

## 2016-05-04 LAB — CBC WITH DIFFERENTIAL/PLATELET
BASOS PCT: 0.1 % (ref 0.0–3.0)
Basophils Absolute: 0 10*3/uL (ref 0.0–0.1)
EOS PCT: 1.8 % (ref 0.0–5.0)
Eosinophils Absolute: 0.1 10*3/uL (ref 0.0–0.7)
HCT: 37.7 % (ref 36.0–46.0)
HEMOGLOBIN: 12.8 g/dL (ref 12.0–15.0)
LYMPHS ABS: 0.8 10*3/uL (ref 0.7–4.0)
Lymphocytes Relative: 11.3 % — ABNORMAL LOW (ref 12.0–46.0)
MCHC: 34 g/dL (ref 30.0–36.0)
MCV: 89.2 fl (ref 78.0–100.0)
MONO ABS: 0.6 10*3/uL (ref 0.1–1.0)
Monocytes Relative: 8.1 % (ref 3.0–12.0)
NEUTROS PCT: 78.7 % — AB (ref 43.0–77.0)
Neutro Abs: 5.5 10*3/uL (ref 1.4–7.7)
Platelets: 287 10*3/uL (ref 150.0–400.0)
RBC: 4.22 Mil/uL (ref 3.87–5.11)
RDW: 14.6 % (ref 11.5–15.5)
WBC: 7 10*3/uL (ref 4.0–10.5)

## 2016-05-04 LAB — BASIC METABOLIC PANEL
BUN: 18 mg/dL (ref 6–23)
CHLORIDE: 105 meq/L (ref 96–112)
CO2: 29 mEq/L (ref 19–32)
Calcium: 9.2 mg/dL (ref 8.4–10.5)
Creatinine, Ser: 1.1 mg/dL (ref 0.40–1.20)
GFR: 51.67 mL/min — AB (ref >60.00)
Glucose, Bld: 92 mg/dL (ref 70–99)
POTASSIUM: 3.7 meq/L (ref 3.5–5.1)
SODIUM: 140 meq/L (ref 135–145)

## 2016-05-04 NOTE — Progress Notes (Signed)
Pre visit review using our clinic review tool, if applicable. No additional management support is needed unless otherwise documented below in the visit note. 

## 2016-05-04 NOTE — Patient Instructions (Signed)
You will be contacted about scheduling your appointment with GI and for CT of the abdomen/pelvix. Please complete lab work prior to leaving. Complete stool studies and return at your earliest convenience. Call if diarrhea worsens or if it does not improve in the next few days.

## 2016-05-04 NOTE — Telephone Encounter (Signed)
Notified pt. 

## 2016-05-04 NOTE — Progress Notes (Signed)
Subjective:    Patient ID: Cassandra Miles, female    DOB: 1941-02-28, 75 y.o.   MRN: [id number]  HPI  Cassandra Miles is a 75 yr old female who presents today with chief complaint of diarrhea. Reports associated mucous in stool as well as some incontinence of stool and intermittent nausea. She does have a history of IBS.  Reports stool has foul odor.  She denies rectal pain. Reports that she occasionally has a small amount of red on the toilet tissue.  Reports that her energy has been less lately. Notes "something protruding out of the rectum with a black spot on it."    She reports + ventral hernia which seems to be enlarging.    Review of Systems    see HPI  Past Medical History:  Diagnosis Date  . Atypical chest pain    a. 09/2013 Admitted -> Negative stress echo, EF 60%, no rwma.  . Avascular necrosis ([place])   . Barrett esophagus   . Blood transfusion without reported diagnosis    WITH  HIP REPLACEMENT 06,07  . Cataract   . Depression   . GERD (gastroesophageal reflux disease)   . Hepatic cyst   . Hyperlipidemia   . Hypertension   . Osteoarthritis    a. knee  . PAF (paroxysmal atrial fibrillation) ([place])    a. 1 documented episode in 2008.  Marland Kitchen Palpitations    a. 08/2013 event monitor: no events but was noncompliant;  b. metoprolol added 09/2013.  Marland Kitchen Peptic ulcer disease   . Restless leg syndrome   . Sleep apnea   . Spinal stenosis   . Vitamin D deficiency      Social History   Social History  . Marital status: Divorced    Spouse name: N/A  . Number of children: N/A  . Years of education: N/A   Occupational History  . retired Retired    Pharmacist, hospital   Social History Main Topics  . Smoking status: Never Smoker  . Smokeless tobacco: Never Used  . Alcohol use No  . Drug use: No  . Sexual activity: Not on file   Other Topics Concern  . Not on file   Social History Narrative   Moved from [place], [place]   retired Education officer, museum   Lives alone- divorced since 1992   Has some cousins near- one cousin in [place], one in [place]   One son in [place], one son in [place], Mental illness- was incarcerated as a teen.      Past Surgical History:  Procedure Laterality Date  . BREAST REDUCTION SURGERY    . CHOLECYSTECTOMY    . COLONOSCOPY    . DECOMPRESSION CORE HIP     bilateral hips  . HIP FRACTURE SURGERY     Bilateral hip replacements  . ORIF WRIST FRACTURE  09/14/2011   Procedure: OPEN REDUCTION INTERNAL FIXATION (ORIF) WRIST FRACTURE;  Surgeon: Willa Frater III;  Location: [facility];  Service: Orthopedics;  Laterality: N/A;  . TUBAL LIGATION      Family History  Problem Relation Age of Onset  . Arthritis Father   . Stroke Mother     x2  . Heart disease Mother   . Heart disease Brother     Stenting   . Hyperlipidemia Brother   . Melanoma Brother   . Colon cancer Paternal Aunt     x 2  . Diabetes Brother     Allergies  Allergen Reactions  . Bupropion  Hcl Other (See Comments)    Made her feel bad  . Ciprofloxacin Nausea And Vomiting    REACTION: hives  . Penicillins Rash    REACTION: rash    Current Outpatient Prescriptions on File Prior to Visit  Medication Sig Dispense Refill  . acetaminophen (TYLENOL) 325 MG tablet Take 2 tablets (650 mg total) by mouth every 6 (six) hours as needed for mild pain (or Fever >/= 101).    Marland Kitchen aspirin EC 81 MG tablet Take 1 tablet (81 mg total) by mouth daily. 30 tablet 3  . colchicine 0.6 MG tablet 2 tabs by mouth now,then 1 tab one hour later 6 tablet 0  . diclofenac (VOLTAREN) 75 MG EC tablet Take 1 tablet (75 mg total) by mouth 2 (two) times daily. 60 tablet 1  . lisinopril (PRINIVIL,ZESTRIL) 5 MG tablet Take 1 tablet (5 mg total) by mouth daily. 90 tablet 1  . MAGNESIUM PO Take 1 tablet by mouth daily.    . metoprolol succinate (TOPROL-XL) 50 MG 24 hr tablet Take 1 tablet (50 mg total) by mouth daily. Take with or immediately following a meal. 30 tablet 11  . Multiple Vitamin (MULTIVITAMIN)  tablet Take 1 tablet by mouth daily.    Marland Kitchen omeprazole (PRILOSEC) 40 MG capsule Take 40 mg by mouth daily as needed (AS NEEDED FOR ACID REFLUX).    Marland Kitchen rOPINIRole (REQUIP) 1 MG tablet Take 1 tablet (1 mg total) by mouth at bedtime. 30 tablet 5  . sertraline (ZOLOFT) 100 MG tablet Take 1 tablet (100 mg total) by mouth daily. 30 tablet 5  . Vitamin D, Ergocalciferol, (DRISDOL) 50000 units CAPS capsule Take 1 capsule (50,000 Units total) by mouth every 7 (seven) days. 12 capsule 0   No current facility-administered medications on file prior to visit.     BP 130/70   Pulse (!) 58   Temp 97.8 F (36.6 C) (Oral)   Resp 16   Ht 5\' 7"  (1.702 m)   Wt 190 lb 12.8 oz (86.5 kg)   SpO2 98% Comment: room air  BMI 29.88 kg/m    Objective:   Physical Exam  Constitutional: She is oriented to person, place, and time. She appears well-developed and well-nourished.  HENT:  Head: Normocephalic and atraumatic.  Cardiovascular: Normal rate, regular rhythm and normal heart sounds.   No murmur heard. Pulmonary/Chest: Effort normal and breath sounds normal. No respiratory distress. She has no wheezes.  Abdominal:  Mild tenderness of abdominal wall in epigastric region. No obvious bulging is noted.   Genitourinary: There is no rash on the right labia. There is no rash on the left labia. No vaginal discharge found.  Genitourinary Comments: Grade 2 bladder prolapse Rectal prolapse noted Normal internal rectal exam  Stool heme negative.    Neurological: She is alert and oriented to person, place, and time.  Psychiatric: She has a normal mood and affect. Her behavior is normal. Judgment and thought content normal.          Assessment & Plan:  Rectal prolapse- prolapse inverted during visit upon re-examination. Will refer to GI for further evaluation.   Diarrhea/fecal incontinence-  Obtain stool studies as below. GI evaluation.  Abdominal discomfort-? Ventral hernia, not appreciated on exam today. Will  obtain CT abd/pelvis to further evaluate abdominal wall.  Consider referral to general surgeon for evaluation of ?ventral hernia and rectal prolapse. Await CT results.  Vit D deficiency- obtain follow up vit D level.  Case was reviewed with  Dr. Charlett Blake.

## 2016-05-05 ENCOUNTER — Telehealth: Payer: Self-pay | Admitting: Family

## 2016-05-05 DIAGNOSIS — E559 Vitamin D deficiency, unspecified: Secondary | ICD-10-CM

## 2016-05-05 NOTE — Telephone Encounter (Signed)
Vit D still low.  Please repeat vit D level in 5-6 weeks.  Continue weekly supplement.

## 2016-05-07 ENCOUNTER — Ambulatory Visit (HOSPITAL_BASED_OUTPATIENT_CLINIC_OR_DEPARTMENT_OTHER)
Admission: RE | Admit: 2016-05-07 | Discharge: 2016-05-07 | Disposition: A | Discharge status: Home / Self Care | Payer: Medicare Other | Source: Ambulatory Visit | Attending: Family | Admitting: Family

## 2016-05-07 ENCOUNTER — Other Ambulatory Visit: Payer: Self-pay | Admitting: Family

## 2016-05-07 ENCOUNTER — Encounter (HOSPITAL_BASED_OUTPATIENT_CLINIC_OR_DEPARTMENT_OTHER): Payer: Self-pay

## 2016-05-07 DIAGNOSIS — K5730 Diverticulosis of large intestine without perforation or abscess without bleeding: Secondary | ICD-10-CM | POA: Diagnosis not present

## 2016-05-07 DIAGNOSIS — K439 Ventral hernia without obstruction or gangrene: Secondary | ICD-10-CM

## 2016-05-07 DIAGNOSIS — K7689 Other specified diseases of liver: Secondary | ICD-10-CM | POA: Insufficient documentation

## 2016-05-07 DIAGNOSIS — I700 Atherosclerosis of aorta: Secondary | ICD-10-CM | POA: Diagnosis not present

## 2016-05-07 MED ORDER — IOPAMIDOL (ISOVUE-300) INJECTION 61%
100.0000 mL | Freq: Once | INTRAVENOUS | Status: AC | PRN
  Administered 2016-05-07: 100 mL via INTRAVENOUS

## 2016-05-07 MED ORDER — VITAMIN D (ERGOCALCIFEROL) 1.25 MG (50000 UNIT) PO CAPS: 50000.0000 [IU] | ORAL_CAPSULE | ORAL | 0 refills | Status: DC

## 2016-05-07 NOTE — Telephone Encounter (Signed)
Notified pt of below results and she voices understanding. Requested refill of vitamin D. Refill sent and future lab order entered.

## 2016-05-08 LAB — CLOSTRIDIUM DIFFICILE BY PCR: Toxigenic C. Difficile by PCR: NOT DETECTED

## 2016-05-08 LAB — OVA AND PARASITE EXAMINATION: OP: NONE SEEN

## 2016-05-09 ENCOUNTER — Telehealth: Payer: Self-pay | Admitting: *Deleted

## 2016-05-09 NOTE — Telephone Encounter (Signed)
Noted  

## 2016-05-09 NOTE — Telephone Encounter (Signed)
Pt called asking about stool test results. Advised pt that O&P and C diff negative but stool culture is still pending. Will notify pt once final result is received.  Spoke with Manuela Schwartz at [facility]. Stool culture was overlooked / not run. States they can still add test and will start running it today. Turnaround time may be up to 4 days. Will notify pt once complete.

## 2016-05-10 ENCOUNTER — Encounter: Payer: Self-pay | Admitting: Family

## 2016-05-13 LAB — STOOL CULTURE

## 2016-06-12 ENCOUNTER — Other Ambulatory Visit (INDEPENDENT_AMBULATORY_CARE_PROVIDER_SITE_OTHER): Payer: Medicare Other

## 2016-06-12 DIAGNOSIS — E559 Vitamin D deficiency, unspecified: Secondary | ICD-10-CM

## 2016-06-12 LAB — VITAMIN D 25 HYDROXY (VIT D DEFICIENCY, FRACTURES): VITD: 32.32 ng/mL (ref 30.00–100.00)

## 2016-06-13 ENCOUNTER — Other Ambulatory Visit: Payer: Self-pay | Admitting: Family

## 2016-06-13 MED ORDER — VITAMIN D3 75 MCG (3000 UT) PO TABS: 1.0000 | ORAL_TABLET | Freq: Every day | ORAL | 5 refills | Status: DC

## 2016-06-13 NOTE — Progress Notes (Signed)
Vit D looks good. D/c weekly 50000 unit vit D. Begin 3000 units vit D once daily otc.

## 2016-06-14 NOTE — Progress Notes (Signed)
Patient has been made aware and she has agreed to stop the 50,000 of vitamin D and start an over the counter 3,000 units daily.    KP

## 2016-06-20 ENCOUNTER — Ambulatory Visit (INDEPENDENT_AMBULATORY_CARE_PROVIDER_SITE_OTHER): Payer: Medicare Other | Admitting: Gastroenterology

## 2016-06-20 ENCOUNTER — Encounter: Payer: Self-pay | Admitting: Gastroenterology

## 2016-06-20 VITALS — BP 170/76 | HR 64 | Ht 67.0 in | Wt 192.0 lb

## 2016-06-20 DIAGNOSIS — R197 Diarrhea, unspecified: Secondary | ICD-10-CM

## 2016-06-20 DIAGNOSIS — R198 Other specified symptoms and signs involving the digestive system and abdomen: Secondary | ICD-10-CM | POA: Diagnosis not present

## 2016-06-20 MED ORDER — NA SULFATE-K SULFATE-MG SULF 17.5-3.13-1.6 GM/177ML PO SOLN: 1.0000 | Freq: Once | ORAL | 0 refills | Status: AC

## 2016-06-20 NOTE — Progress Notes (Addendum)
[facility] Consult Note:  History: TANISHA LUTES 06/20/2016  Referring physician: Nance Pear., NP  Reason for consult/chief complaint: Diarrhea (pt reports intermittent episodes of "explove" diarrhea that is "mucous-y, yellow-y, orangy-y";  they are unpredictable and she cannot make it to a bathroom;; pt thinks she may have seen some blood in her stool at some point )   Subjective  HPI:  This is a 75 year old woman seeing me for the first time. She saw Dr. Olevia Perches as recently as May 2014. Reviewed that office note, and it indicates that Bernita's last colonoscopy was in 2009. 2014 there is a positive Gliadin antibody but a normal IgA TTG anybody. Due to abdominal bloating and diarrhea, and upper endoscopy was done, but duodenal biopsies were negative for celiac. She is complaining of at least a decade of lower abdominal cramps urgency and diarrhea leading to some episodes of fecal incontinence. It is unpredictable, she is not sure if there is any clear food or stress triggers. She has very rare episodes of nocturnal incontinence, and perhaps once a month or less we will get up at night in order to have a bowel movement. She reports many accidents that have occurred when she has been at the shopping mall, church or her friend's house.She has lost track of how many underpants she has thrown away from accidents. She's been on magnesium for muscle cramps, she is also been taking a PPI for years, though not every day. Imodium has been helpful, but it's difficult to take because the episodes are unpredictable.  ROS:  Review of Systems  Constitutional: Negative for appetite change and unexpected weight change.  HENT: Negative for mouth sores and voice change.   Eyes: Negative for pain and redness.  Respiratory: Negative for cough and shortness of breath.   Cardiovascular: Negative for chest pain and palpitations.  Genitourinary: Negative for dysuria and hematuria.   Musculoskeletal: Negative for arthralgias and myalgias.  Skin: Negative for pallor and rash.  Neurological: Negative for weakness and headaches.  Hematological: Negative for adenopathy.     Past Medical History: Past Medical History:  Diagnosis Date  . Atypical chest pain    a. 09/2013 Admitted -> Negative stress echo, EF 60%, no rwma.  . Avascular necrosis ([place])   . Barrett esophagus   . Blood transfusion without reported diagnosis    WITH  HIP REPLACEMENT 06,07  . Cataract   . Depression   . GERD (gastroesophageal reflux disease)   . Hepatic cyst   . Hiatal hernia   . Hyperlipidemia   . Hypertension   . Osteoarthritis    a. knee  . PAF (paroxysmal atrial fibrillation) ([place])    a. 1 documented episode in 2008.  Marland Kitchen Palpitations    a. 08/2013 event monitor: no events but was noncompliant;  b. metoprolol added 09/2013.  Marland Kitchen Peptic ulcer disease   . Restless leg syndrome   . Sleep apnea   . Spinal stenosis   . Vitamin D deficiency      Past Surgical History: Past Surgical History:  Procedure Laterality Date  . BREAST REDUCTION SURGERY    . CHOLECYSTECTOMY    . COLONOSCOPY    . DECOMPRESSION CORE HIP     bilateral hips  . HIP FRACTURE SURGERY     Bilateral hip replacements  . ORIF WRIST FRACTURE  09/14/2011   Procedure: OPEN REDUCTION INTERNAL FIXATION (ORIF) WRIST FRACTURE;  Surgeon: Willa Frater III;  Location: [facility];  Service: Orthopedics;  Laterality: N/A;  .  TUBAL LIGATION       Family History: Family History  Problem Relation Age of Onset  . Arthritis Father   . Stroke Mother     x2  . Heart disease Mother   . Heart disease Brother     Stenting   . Hyperlipidemia Brother   . Melanoma Brother   . Colon cancer Paternal Aunt     x 2  . Diabetes Brother     Social History: Social History   Social History  . Marital status: Divorced    Spouse name: N/A  . Number of children: N/A  . Years of education: N/A   Occupational History  . retired  Retired    Pharmacist, hospital   Social History Main Topics  . Smoking status: Never Smoker  . Smokeless tobacco: Never Used  . Alcohol use No  . Drug use: No  . Sexual activity: Not Asked   Other Topics Concern  . None   Social History Narrative   Moved from [place], [place]   retired Education officer, museum   Lives alone- divorced since 1992   Has some cousins near- one cousin in [place], one in [place]   One son in [place], one son in [place], Mental illness- was incarcerated as a teen.      Allergies: Allergies  Allergen Reactions  . Bupropion Hcl Other (See Comments)    Made her feel bad  . Ciprofloxacin Nausea And Vomiting    REACTION: hives  . Penicillins Rash    REACTION: rash    Outpatient Meds: Current Outpatient Prescriptions  Medication Sig Dispense Refill  . acetaminophen (TYLENOL) 325 MG tablet Take 2 tablets (650 mg total) by mouth every 6 (six) hours as needed for mild pain (or Fever >/= 101).    Marland Kitchen aspirin EC 81 MG tablet Take 1 tablet (81 mg total) by mouth daily. 30 tablet 3  . Cholecalciferol (VITAMIN D3) 3000 units TABS Take 1 tablet by mouth daily. 30 tablet 5  . lisinopril (PRINIVIL,ZESTRIL) 5 MG tablet Take 1 tablet (5 mg total) by mouth daily. 90 tablet 1  . MAGNESIUM PO Take 1 tablet by mouth daily.    . metoprolol succinate (TOPROL-XL) 50 MG 24 hr tablet Take 1 tablet (50 mg total) by mouth daily. Take with or immediately following a meal. 30 tablet 11  . Multiple Vitamin (MULTIVITAMIN) tablet Take 1 tablet by mouth daily.    Marland Kitchen omeprazole (PRILOSEC) 40 MG capsule Take 40 mg by mouth daily as needed (AS NEEDED FOR ACID REFLUX).    Marland Kitchen rOPINIRole (REQUIP) 1 MG tablet Take 1 tablet (1 mg total) by mouth at bedtime. 30 tablet 5  . sertraline (ZOLOFT) 100 MG tablet Take 1 tablet (100 mg total) by mouth daily. 30 tablet 5  . traZODone (DESYREL) 50 MG tablet Take 0.5 tablets by mouth at bedtime as needed.    . Na Sulfate-K Sulfate-Mg Sulf 17.5-3.13-1.6 GM/180ML SOLN  Take 1 kit by mouth once. 354 mL 0   No current facility-administered medications for this visit.       ___________________________________________________________________ Objective   Exam:  BP (!) 170/76   Pulse 64   Ht 5' 7" (1.702 m)   Wt 192 lb (87.1 kg)   BMI 30.07 kg/m    General: this is a(n) Well-appearing older woman   Eyes: sclera anicteric, no redness  ENT: oral mucosa moist without lesions, no cervical or supraclavicular lymphadenopathy, good dentition  CV: RRR without murmur, S1/S2, no JVD,  no peripheral edema  Resp: clear to auscultation bilaterally, normal RR and effort noted  GI: soft, no tenderness, with active bowel sounds. No guarding or palpable organomegaly noted.  Skin; warm and dry, no rash or jaundice noted  Neuro: awake, alert and oriented x 3. Normal gross motor function and fluent speech  Labs:  Last month normal CBC, CMP, TSH, IgA tissue transglutaminase antibody, and a negative GI pathogen panel including C. difficile and ova and parasites  Radiologic Studies:  CT scan abdomen and pelvis from 8/14: sig tics, nothing acute  Assessment: Encounter Diagnoses  Name Primary?  . Diarrhea, unspecified type Yes  . Tenesmus     It is not clear whether this problem predates her cholecystectomy to know if bile acid diarrhea may be playing a role, but it sounds that the problem is significantly worsened in the last few years. A lot of it sounds like IBS, but I advised her to have a colonoscopy to see if there might be microscopic colitis given the frequency and severity of symptoms. I also asked her to stop the magnesium for now will just to see if it is contributing.  Plan: Colonoscopy with random biopsies to rule out microscopic colitis    The benefits and risks of the planned procedure were described in detail with the patient or (when appropriate) their health care proxy.  Risks were outlined as including, but not limited to, bleeding,  infection, perforation, adverse medication reaction leading to cardiac or pulmonary decompensation, or pancreatitis (if ERCP).  The limitation of incomplete mucosal visualization was also discussed.  No guarantees or warranties were given.   Thank you for the courtesy of this consult.  Please call me with any questions or concerns.  Nelida Meuse III  CC: Nance Pear., NP

## 2016-06-20 NOTE — Patient Instructions (Signed)
If you are age 73 or older, your body mass index should be between 23-30. Your Body mass index is 30.07 kg/m. If this is out of the aforementioned range listed, please consider follow up with your Primary Care Provider.  If you are age 64 or younger, your body mass index should be between 19-25. Your Body mass index is 30.07 kg/m. If this is out of the aformentioned range listed, please consider follow up with your Primary Care Provider.   You have been scheduled for a colonoscopy. Please follow written instructions given to you at your visit today.  Please pick up your prep supplies at the pharmacy within the next 1-3 days. If you use inhalers (even only as needed), please bring them with you on the day of your procedure. Your physician has requested that you go to www.startemmi.com and enter the access code given to you at your visit today. This web site gives a general overview about your procedure. However, you should still follow specific instructions given to you by our office regarding your preparation for the procedure.  Thank you for choosing [facility]  Dr Henry Danis III    

## 2016-06-25 ENCOUNTER — Encounter: Payer: Self-pay | Admitting: Gastroenterology

## 2016-06-25 ENCOUNTER — Ambulatory Visit (AMBULATORY_SURGERY_CENTER): Payer: Medicare Other | Admitting: Gastroenterology

## 2016-06-25 VITALS — BP 110/59 | HR 70 | Temp 98.2°F | Resp 12 | Ht 67.0 in | Wt 192.0 lb

## 2016-06-25 DIAGNOSIS — K591 Functional diarrhea: Secondary | ICD-10-CM | POA: Diagnosis present

## 2016-06-25 MED ORDER — SODIUM CHLORIDE 0.9 % IV SOLN: 500.0000 mL | INTRAVENOUS | Status: DC

## 2016-06-25 NOTE — Patient Instructions (Signed)
YOU HAD AN ENDOSCOPIC PROCEDURE TODAY AT [facility]:   Refer to the procedure report that was given to you for any specific questions about what was found during the examination.  If the procedure report does not answer your questions, please call your gastroenterologist to clarify.  If you requested that your care partner not be given the details of your procedure findings, then the procedure report has been included in a sealed envelope for you to review at your convenience later.  YOU SHOULD EXPECT: Some feelings of bloating in the abdomen. Passage of more gas than usual.  Walking can help get rid of the air that was put into your GI tract during the procedure and reduce the bloating. If you had a lower endoscopy (such as a colonoscopy or flexible sigmoidoscopy) you may notice spotting of blood in your stool or on the toilet paper. If you underwent a bowel prep for your procedure, you may not have a normal bowel movement for a few days.  Please Note:  You might notice some irritation and congestion in your nose or some drainage.  This is from the oxygen used during your procedure.  There is no need for concern and it should clear up in a day or so.  SYMPTOMS TO REPORT IMMEDIATELY:   Following lower endoscopy (colonoscopy or flexible sigmoidoscopy):  Excessive amounts of blood in the stool  Significant tenderness or worsening of abdominal pains  Swelling of the abdomen that is new, acute  Fever of 100F or higher   For urgent or emergent issues, a gastroenterologist can be reached at any hour by calling [phone].   DIET:  We do recommend a small meal at first, but then you may proceed to your regular diet.  Drink plenty of fluids but you should avoid alcoholic beverages for 24 hours.  ACTIVITY:  You should plan to take it easy for the rest of today and you should NOT DRIVE or use heavy machinery until tomorrow (because of the sedation medicines used during the test).     FOLLOW UP: Our staff will call the number listed on your records the next business day following your procedure to check on you and address any questions or concerns that you may have regarding the information given to you following your procedure. If we do not reach you, we will leave a message.  However, if you are feeling well and you are not experiencing any problems, there is no need to return our call.  We will assume that you have returned to your regular daily activities without incident.  If any biopsies were taken you will be contacted by phone or by letter within the next 1-3 weeks.  Please call us at [phone] if you have not heard about the biopsies in 3 weeks.    SIGNATURES/CONFIDENTIALITY: You and/or your care partner have signed paperwork which will be entered into your electronic medical record.  These signatures attest to the fact that that the information above on your After Visit Summary has been reviewed and is understood.  Full responsibility of the confidentiality of this discharge information lies with you and/or your care-partner.  Diverticulosis-handout given  You do not need another routine screening colonoscopy due to age.  Wait pathology results.

## 2016-06-25 NOTE — Op Note (Signed)
[facility] Patient Name: Cassandra Miles Procedure Date: 06/25/2016 3:17 PM MRN: [id number] Endoscopist: Douglas. Loletha Carrow , MD Age: 75 Referring MD:  Date of Birth: [birth year] Gender: Female Account #: [id number] Procedure:                Colonoscopy Indications:              Chronic diarrhea Medicines:                Monitored Anesthesia Care Procedure:                Pre-Anesthesia Assessment:                           - Prior to the procedure, a History and Physical                            was performed, and patient medications and                            allergies were reviewed. The patient's tolerance of                            previous anesthesia was also reviewed. The risks                            and benefits of the procedure and the sedation                            options and risks were discussed with the patient.                            All questions were answered, and informed consent                            was obtained. Prior Anticoagulants: The patient has                            taken no previous anticoagulant or antiplatelet                            agents. ASA Grade Assessment: II - A patient with                            mild systemic disease. After reviewing the risks                            and benefits, the patient was deemed in                            satisfactory condition to undergo the procedure.                           After obtaining informed consent, the colonoscope  was passed under direct vision. Throughout the                            procedure, the patient's blood pressure, pulse, and                            oxygen saturations were monitored continuously. The                            Model PCF-H190DL [id number]) scope was introduced                            through the anus and advanced to the the cecum,                            identified by appendiceal orifice and  ileocecal                            valve. The colonoscopy was performed without                            difficulty. The patient tolerated the procedure                            well. The quality of the bowel preparation was                            good. The ileocecal valve, appendiceal orifice, and                            rectum were photographed. The quality of the bowel                            preparation was evaluated using the BBPS [facility]                            Bowel Preparation Scale) with scores of: Right                            Colon = 2, Transverse Colon = 2 and Left Colon = 2.                            The total BBPS score equals 6. The bowel                            preparation used was Miralax and SUPREP. (the                            patient vomited after PM suprep dose, so took                            miralax/sports drink for AM dose) Scope In: 3:40:09 PM Scope Out: 3:51:38 PM Scope Withdrawal  Time: 0 hours 8 minutes 25 seconds  Total Procedure Duration: 0 hours 11 minutes 29 seconds  Findings:                 The digital rectal exam findings include decreased                            sphincter tone.                           Diverticula were found in the left colon.                           Normal mucosa was found in the entire colon.                            Biopsies for histology were taken with a cold                            forceps from the right colon and left colon for                            evaluation of microscopic colitis.                           Retroflexion in the rectum was not performed due to                            anatomy.                           The exam was otherwise without abnormality. Complications:            No immediate complications. Estimated Blood Loss:     Estimated blood loss: none. Impression:               - Decreased sphincter tone found on digital rectal                            exam.                            - Diverticulosis in the left colon.                           - Normal mucosa in the entire examined colon.                            Biopsied.                           - The examination was otherwise normal.                           If biopsies negative for micoscopic colitis, then                            the  overall clinical scenario is most consistent                            with IBS. Recommendation:           - Patient has a contact number available for                            emergencies. The signs and symptoms of potential                            delayed complications were discussed with the                            patient. Return to normal activities tomorrow.                            Written discharge instructions were provided to the                            patient.                           - Resume previous diet.                           - Continue present medications.                           - Await pathology results.                           - No recommendation at this time regarding repeat                            colonoscopy due to age. Henry L. Loletha Carrow, MD 06/25/2016 3:57:08 PM This report has been signed electronically.

## 2016-06-25 NOTE — Progress Notes (Signed)
Called to room to assist during endoscopic procedure.  Patient ID and intended procedure confirmed with present staff. Received instructions for my participation in the procedure from the performing physician.  

## 2016-06-25 NOTE — Progress Notes (Signed)
To PACU, vss patent aw report to rn 

## 2016-06-26 ENCOUNTER — Telehealth: Payer: Self-pay

## 2016-06-26 NOTE — Telephone Encounter (Signed)
  Follow up Call-  Call back number 06/25/2016  Post procedure Call Back phone  # [phone]  Permission to leave phone message Yes  Some recent data might be hidden     Patient questions:  Do you have a fever, pain , or abdominal swelling? No. Pain Score  0 *  Have you tolerated food without any problems? Yes.    Have you been able to return to your normal activities? Yes.    Do you have any questions about your discharge instructions: Diet   No. Medications  No. Follow up visit  No.  Do you have questions or concerns about your Care? No.  Actions: * If pain score is 4 or above: No action needed, pain <4.

## 2016-07-04 ENCOUNTER — Telehealth: Payer: Self-pay | Admitting: Gastroenterology

## 2016-07-04 NOTE — Telephone Encounter (Signed)
Called patient back to let her know that I would forward this message onto Dr. Loletha Carrow to see if he has had a chance to review the results. Once he has we would let her know.

## 2016-10-02 ENCOUNTER — Telehealth: Payer: Self-pay | Admitting: Family

## 2016-10-02 NOTE — Telephone Encounter (Signed)
Called patient to schedule awv. Left msg for patient to call office to make appt.

## 2016-10-05 ENCOUNTER — Telehealth: Payer: Self-pay | Admitting: Family

## 2016-10-08 NOTE — Telephone Encounter (Signed)
Ropinirole refill sent to pharmacy. Pt last seen 04/2016 and has no follow up appts scheduled. Please advise when pt should follow up in the office.

## 2016-10-08 NOTE — Telephone Encounter (Signed)
6 month follow up please

## 2016-10-09 NOTE — Telephone Encounter (Signed)
lvm for pt to return call to schedule F/U.

## 2016-10-09 NOTE — Telephone Encounter (Signed)
Cassandra Miles-- please call pt to schedule a 6 month follow with Melissa in February.  Thanks!

## 2016-10-09 NOTE — Telephone Encounter (Signed)
Please attempt to reach pt tomorrow and if no success I will send her a letter.

## 2016-10-15 NOTE — Telephone Encounter (Signed)
Pt has been scheduled.  °

## 2016-10-15 NOTE — Telephone Encounter (Signed)
Attempted to reach patient and offer 11/05/16 at 9:30am with Hoyle Sauer due to patient having a follow up with PCP at 10:45pm. LVM awaiting call back.   05/23/15 PR PPPS, SUBSEQ VISIT [id number]

## 2016-11-02 ENCOUNTER — Telehealth: Payer: Self-pay | Admitting: *Deleted

## 2016-11-02 NOTE — Telephone Encounter (Signed)
LM for patient to return call to schedule AWV.   

## 2016-11-05 ENCOUNTER — Ambulatory Visit: Payer: Medicare Other | Admitting: Family

## 2016-11-13 ENCOUNTER — Ambulatory Visit (INDEPENDENT_AMBULATORY_CARE_PROVIDER_SITE_OTHER): Payer: Medicare Other | Admitting: Family

## 2016-11-13 VITALS — BP 141/61 | HR 73 | Temp 97.7°F | Ht 67.0 in | Wt 180.8 lb

## 2016-11-13 DIAGNOSIS — I10 Essential (primary) hypertension: Secondary | ICD-10-CM

## 2016-11-13 DIAGNOSIS — F32A Depression, unspecified: Secondary | ICD-10-CM

## 2016-11-13 DIAGNOSIS — G2581 Restless legs syndrome: Secondary | ICD-10-CM | POA: Diagnosis not present

## 2016-11-13 DIAGNOSIS — F329 Major depressive disorder, single episode, unspecified: Secondary | ICD-10-CM | POA: Diagnosis not present

## 2016-11-13 DIAGNOSIS — E785 Hyperlipidemia, unspecified: Secondary | ICD-10-CM

## 2016-11-13 NOTE — Progress Notes (Signed)
Pre visit review using our clinic review tool, if applicable. No additional management support is needed unless otherwise documented below in the visit note. 

## 2016-11-13 NOTE — Assessment & Plan Note (Signed)
Has improved diet, will obtain follow up lipid panel.

## 2016-11-13 NOTE — Patient Instructions (Signed)
Please complete lab work prior to leaving. Great job with your weight loss!

## 2016-11-13 NOTE — Assessment & Plan Note (Signed)
Stable on requip, continue same.  

## 2016-11-13 NOTE — Assessment & Plan Note (Signed)
Stable. Management per psychiatry.   

## 2016-11-13 NOTE — Assessment & Plan Note (Signed)
Stable on current meds 

## 2016-11-13 NOTE — Progress Notes (Signed)
Subjective:    Patient ID: Cassandra Miles, female    DOB: Feb 19, 1941, 76 y.o.   MRN: [id number]  HPI  Cassandra Miles is a 76 yr old female who presents today for follow up.  Will be moving to [place].  Her son and grandchildren are there.   1) Depression- maintained on zoloft. Reports that she is following with psychiatry and mood is good on 1.5 tabs of zoloft.    2) Hyperlipidemia-not on statin.   Lab Results  Component Value Date   CHOL 242 (H) 05/23/2015   HDL 55.40 05/23/2015   LDLCALC 160 (H) 05/23/2015   LDLDIRECT 167.6 10/18/2010   TRIG 131.0 05/23/2015   CHOLHDL 4 05/23/2015   3) HTN- on metoprolol and lisinopril.  Denies CP/SOB or swelling.  BP Readings from Last 3 Encounters:  11/13/16 (!) 141/61  06/25/16 (!) 110/59  06/20/16 (!) 170/76   4) RLS- stable on requip.   Review of Systems See HPI  Past Medical History:  Diagnosis Date  . Atypical chest pain    a. 09/2013 Admitted -> Negative stress echo, EF 60%, no rwma.  . Avascular necrosis ([place])   . Barrett esophagus   . Blood transfusion without reported diagnosis    WITH  HIP REPLACEMENT 06,07  . Cataract   . Depression   . GERD (gastroesophageal reflux disease)   . Hepatic cyst   . Hiatal hernia   . Hyperlipidemia   . Hypertension   . Osteoarthritis    a. knee  . PAF (paroxysmal atrial fibrillation) ([place])    a. 1 documented episode in 2008.  Marland Kitchen Palpitations    a. 08/2013 event monitor: no events but was noncompliant;  b. metoprolol added 09/2013.  Marland Kitchen Peptic ulcer disease   . Restless leg syndrome   . Sleep apnea   . Spinal stenosis   . Vitamin D deficiency      Social History   Social History  . Marital status: Divorced    Spouse name: N/A  . Number of children: N/A  . Years of education: N/A   Occupational History  . retired Retired    Pharmacist, hospital   Social History Main Topics  . Smoking status: Never Smoker  . Smokeless tobacco: Never Used  . Alcohol use No  . Drug use: No  . Sexual  activity: Not on file   Other Topics Concern  . Not on file   Social History Narrative   Moved from [place], [place]   retired Education officer, museum   Lives alone- divorced since 1992   Has some cousins near- one cousin in [place], one in [place]   One son in [place], one son in [place], Mental illness- was incarcerated as a teen.      Past Surgical History:  Procedure Laterality Date  . BREAST REDUCTION SURGERY    . CHOLECYSTECTOMY    . COLONOSCOPY    . DECOMPRESSION CORE HIP     bilateral hips  . HIP FRACTURE SURGERY     Bilateral hip replacements  . ORIF WRIST FRACTURE  09/14/2011   Procedure: OPEN REDUCTION INTERNAL FIXATION (ORIF) WRIST FRACTURE;  Surgeon: Willa Frater III;  Location: [facility];  Service: Orthopedics;  Laterality: N/A;  . TUBAL LIGATION      Family History  Problem Relation Age of Onset  . Arthritis Father   . Stroke Mother     x2  . Heart disease Mother   . Heart disease Brother  Stenting   . Hyperlipidemia Brother   . Melanoma Brother   . Colon cancer Paternal Aunt     x 2  . Diabetes Brother     Allergies  Allergen Reactions  . Bupropion Hcl Other (See Comments)    Made her feel bad  . Ciprofloxacin Nausea And Vomiting    REACTION: hives  . Penicillins Rash    REACTION: rash    Current Outpatient Prescriptions on File Prior to Visit  Medication Sig Dispense Refill  . acetaminophen (TYLENOL) 325 MG tablet Take 2 tablets (650 mg total) by mouth every 6 (six) hours as needed for mild pain (or Fever >/= 101).    Marland Kitchen aspirin EC 81 MG tablet Take 1 tablet (81 mg total) by mouth daily. 30 tablet 3  . Cholecalciferol (VITAMIN D3) 3000 units TABS Take 1 tablet by mouth daily. 30 tablet 5  . lisinopril (PRINIVIL,ZESTRIL) 5 MG tablet Take 1 tablet (5 mg total) by mouth daily. 90 tablet 1  . metoprolol succinate (TOPROL-XL) 50 MG 24 hr tablet Take 1 tablet (50 mg total) by mouth daily. Take with or immediately following a meal. 30 tablet 11  .  Multiple Vitamin (MULTIVITAMIN) tablet Take 1 tablet by mouth daily.    Marland Kitchen omeprazole (PRILOSEC) 40 MG capsule Take 40 mg by mouth daily as needed (AS NEEDED FOR ACID REFLUX).    Marland Kitchen rOPINIRole (REQUIP) 1 MG tablet take 1 tablet by mouth at bedtime 30 tablet 1  . sertraline (ZOLOFT) 100 MG tablet Take 1 tablet (100 mg total) by mouth daily. 30 tablet 5   Current Facility-Administered Medications on File Prior to Visit  Medication Dose Route Frequency Provider Last Rate Last Dose  . 0.9 %  sodium chloride infusion  500 mL Intravenous Continuous Nelida Meuse III, MD        BP (!) 141/61 (BP Location: Left Arm, Patient Position: Sitting, Cuff Size: Large)   Pulse 73   Temp 97.7 F (36.5 C) (Oral)   Ht 5\' 7"  (1.702 m)   Wt 180 lb 12.8 oz (82 kg)   SpO2 97%   BMI 28.32 kg/m       Objective:   Physical Exam  Constitutional: She appears well-developed and well-nourished.  Cardiovascular: Normal rate, regular rhythm and normal heart sounds.   No murmur heard. Pulmonary/Chest: Effort normal and breath sounds normal. No respiratory distress. She has no wheezes.  Psychiatric: She has a normal mood and affect. Her behavior is normal. Judgment and thought content normal.          Assessment & Plan:

## 2016-12-01 ENCOUNTER — Other Ambulatory Visit: Payer: Self-pay | Admitting: Family

## 2016-12-22 ENCOUNTER — Other Ambulatory Visit: Payer: Self-pay | Admitting: Physician Assistant

## 2016-12-22 DIAGNOSIS — R0602 Shortness of breath: Secondary | ICD-10-CM

## 2016-12-31 ENCOUNTER — Telehealth: Payer: Self-pay | Admitting: Family

## 2016-12-31 NOTE — Telephone Encounter (Signed)
Left pt message asking to call Allison back directly at [phone] to schedule AWV. Thanks! °

## 2017-01-30 ENCOUNTER — Other Ambulatory Visit: Payer: Self-pay | Admitting: Family

## 2017-02-14 NOTE — Progress Notes (Signed)
Subjective:   Cassandra Miles is a 76 y.o. female who presents for Medicare Annual (Subsequent) preventive examination.  Review of Systems:  No ROS.  Medicare Wellness Visit. Cardiac Risk Factors include: none;dyslipidemia;hypertension;sedentary lifestyle Sleep patterns: Goes to bed about 9.Wakes 1-2x to urinate. Sleeps about 7 hrs. Feels rested.  Home Safety/Smoke Alarms: Feels safe in home. Smoke alarms in place.    Living environment; residence and Firearm Safety: Lives alone. 3 steps going into home. No guns. Seat Belt Safety/Bike Helmet: Wears seat belt.   Counseling:   Eye Exam- Cataract sx on right eye. Wears reading glasses.  Dental- every 6 months  Female:   Pap- No longer screening.      Mammo- Last 06/27/15:  BI-RADS CATEGORY  1: Negative.  ORDERED TODAY. Dexa scan- Last 06/10/15:  osteoporosis       CCS- Last 06/25/16: diverticulosis     Objective:     Vitals: BP (!) 150/78 (BP Location: Right Arm, Patient Position: Sitting, Cuff Size: Normal)   Pulse 61   Ht 5\' 7"  (1.702 m)   Wt 178 lb 12.8 oz (81.1 kg)   SpO2 98%   BMI 28.00 kg/m   Body mass index is 28 kg/m.   Tobacco History  Smoking Status  . Never Smoker  Smokeless Tobacco  . Never Used     Counseling given: Not Answered   Past Medical History:  Diagnosis Date  . Atypical chest pain    a. 09/2013 Admitted -> Negative stress echo, EF 60%, no rwma.  . Avascular necrosis ([place])   . Barrett esophagus   . Blood transfusion without reported diagnosis    WITH  HIP REPLACEMENT 06,07  . Cataract   . Depression   . GERD (gastroesophageal reflux disease)   . Hepatic cyst   . Hiatal hernia   . Hyperlipidemia   . Hypertension   . Osteoarthritis    a. knee  . PAF (paroxysmal atrial fibrillation) ([place])    a. 1 documented episode in 2008.  Marland Kitchen Palpitations    a. 08/2013 event monitor: no events but was noncompliant;  b. metoprolol added 09/2013.  Marland Kitchen Peptic ulcer disease   . Restless leg syndrome   .  Sleep apnea   . Spinal stenosis   . Vitamin D deficiency    Past Surgical History:  Procedure Laterality Date  . BREAST REDUCTION SURGERY    . CHOLECYSTECTOMY    . COLONOSCOPY    . DECOMPRESSION CORE HIP     bilateral hips  . HIP FRACTURE SURGERY     Bilateral hip replacements  . ORIF WRIST FRACTURE  09/14/2011   Procedure: OPEN REDUCTION INTERNAL FIXATION (ORIF) WRIST FRACTURE;  Surgeon: Willa Frater III;  Location: [facility];  Service: Orthopedics;  Laterality: N/A;  . TUBAL LIGATION     Family History  Problem Relation Age of Onset  . Arthritis Father   . Stroke Mother        x2  . Heart disease Mother   . Heart disease Brother        Stenting   . Hyperlipidemia Brother   . Melanoma Brother   . Colon cancer Paternal Aunt        x 2  . Diabetes Brother    History  Sexual Activity  . Sexual activity: No    Outpatient Encounter Prescriptions as of 02/15/2017  Medication Sig  . acetaminophen (TYLENOL) 325 MG tablet Take 2 tablets (650 mg total) by mouth every  6 (six) hours as needed for mild pain (or Fever >/= 101).  Marland Kitchen aspirin EC 81 MG tablet Take 1 tablet (81 mg total) by mouth daily.  Marland Kitchen lisinopril (PRINIVIL,ZESTRIL) 5 MG tablet Take 1 tablet (5 mg total) by mouth daily.  . metoprolol succinate (TOPROL-XL) 50 MG 24 hr tablet take 1 tablet by mouth once daily WITH OR IMMEDIATELY FOLLOWING A MEAL  . omeprazole (PRILOSEC) 40 MG capsule Take 40 mg by mouth daily as needed (AS NEEDED FOR ACID REFLUX).  Marland Kitchen rOPINIRole (REQUIP) 1 MG tablet take 1 tablet by mouth at bedtime  . sertraline (ZOLOFT) 100 MG tablet Take 1 tablet (100 mg total) by mouth daily.  . Cholecalciferol (VITAMIN D3) 3000 units TABS Take 1 tablet by mouth daily. (Patient not taking: Reported on 02/15/2017)  . Multiple Vitamin (MULTIVITAMIN) tablet Take 1 tablet by mouth daily.   Facility-Administered Encounter Medications as of 02/15/2017  Medication  . 0.9 %  sodium chloride infusion    Activities of Daily  Living In your present state of health, do you have any difficulty performing the following activities: 02/15/2017 04/02/2016  Hearing? N Y  Vision? N Y  Difficulty concentrating or making decisions? N Y  Walking or climbing stairs? Y Y  Dressing or bathing? N N  Doing errands, shopping? N Y  Conservation officer, nature and eating ? N -  Using the Toilet? N -  In the past six months, have you accidently leaked urine? N -  Do you have problems with loss of bowel control? Y -  Managing your Medications? N -  Managing your Finances? N -  Housekeeping or managing your Housekeeping? N -  Some recent data might be hidden    Patient Care Team: Debbrah Alar, NP as PCP - General (Internal Medicine) Dorothy Spark, MD as Consulting Physician (Cardiology) Lafayette Dragon, MD (Inactive) as Consulting Physician (Gastroenterology)    Assessment:    Physical assessment deferred to PCP.  Exercise Activities and Dietary recommendations Current Exercise Habits: The patient does not participate in regular exercise at present, Exercise limited by: None identified   Diet (meal preparation, eat out, water intake, caffeinated beverages, dairy products, fruits and vegetables): in general, a "healthy" diet  , well balanced  24 Hour Recall Breakfast: 2 eggs, toast, 2 cups of coffee Lunch: tuna sandwich and potato salad Dinner:  Corn, squash, baked beans, cantaloupe 2 full glasses of water per day   Goals    . Weight (lb) < 160 lb (72.6 kg)          Diet and exercise      Fall Risk Fall Risk  02/15/2017 04/02/2016 05/23/2015 05/23/2015  Falls in the past year? No Yes No No  Number falls in past yr: - 2 or more - -  Injury with Fall? - No - -   Depression Screen PHQ 2/9 Scores 02/15/2017 04/02/2016 05/23/2015 05/23/2015  PHQ - 2 Score 0 2 0 1  PHQ- 9 Score - 12 - -     Cognitive Function MMSE - Mini Mental State Exam 02/15/2017  Orientation to time 5  Orientation to Place 5  Registration 3    Attention/ Calculation 5  Recall 3  Language- name 2 objects 2  Language- repeat 1  Language- follow 3 step command 3  Language- read & follow direction 1  Write a sentence 1  Copy design 1  Total score 30        Immunization History  Administered Date(s)  Administered  . Influenza-Unspecified 05/25/2012  . Pneumococcal Conjugate-13 05/23/2015   Screening Tests Health Maintenance  Topic Date Due  . PNA vac Low Risk Adult (2 of 2 - PPSV23) 05/22/2016  . INFLUENZA VACCINE  05/25/2017 (Originally 04/24/2017)  . MAMMOGRAM  06/26/2017  . TETANUS/TDAP  09/24/2018  . DEXA SCAN  Completed      Plan:    Follow up with PCP as directed.  Keep checking your blood pressure at home and let us know if the top number is greater than 150.  Continue to eat heart healthy diet (full of fruits, vegetables, whole grains, lean protein, water--limit salt, fat, and sugar intake) and increase physical activity as tolerated.  Continue doing brain stimulating activities (puzzles, reading, adult coloring books, staying active) to keep memory sharp.   Schedule mammogram. Order placed today.  I have personally reviewed and noted the following in the patient's chart:   . Medical and social history . Use of alcohol, tobacco or illicit drugs  . Current medications and supplements . Functional ability and status . Nutritional status . Physical activity . Advanced directives . List of other physicians . Hospitalizations, surgeries, and ER visits in previous 12 months . Vitals . Screenings to include cognitive, depression, and falls . Referrals and appointments  In addition, I have reviewed and discussed with patient certain preventive protocols, quality metrics, and best practice recommendations. A written personalized care plan for preventive services as well as general preventive health recommendations were provided to patient.     Shela Nevin, [place]  02/15/2017

## 2017-02-15 ENCOUNTER — Ambulatory Visit (INDEPENDENT_AMBULATORY_CARE_PROVIDER_SITE_OTHER): Payer: Medicare Other | Admitting: *Deleted

## 2017-02-15 ENCOUNTER — Encounter: Payer: Self-pay | Admitting: *Deleted

## 2017-02-15 VITALS — BP 150/78 | HR 61 | Ht 67.0 in | Wt 178.8 lb

## 2017-02-15 DIAGNOSIS — Z0000 Encounter for general adult medical examination without abnormal findings: Secondary | ICD-10-CM

## 2017-02-15 DIAGNOSIS — Z1239 Encounter for other screening for malignant neoplasm of breast: Secondary | ICD-10-CM

## 2017-02-15 DIAGNOSIS — D229 Melanocytic nevi, unspecified: Secondary | ICD-10-CM | POA: Diagnosis not present

## 2017-02-15 DIAGNOSIS — Z1231 Encounter for screening mammogram for malignant neoplasm of breast: Secondary | ICD-10-CM | POA: Diagnosis not present

## 2017-02-15 NOTE — Addendum Note (Signed)
Addended by: Debbrah Alar on: 02/15/2017 02:41 PM   Modules accepted: Level of Service

## 2017-02-15 NOTE — Progress Notes (Signed)
Noted and agree. 

## 2017-02-15 NOTE — Patient Instructions (Addendum)
Cassandra Miles , Thank you for taking time to come for your Medicare Wellness Visit. I appreciate your ongoing commitment to your health goals. Please review the following plan we discussed and let me know if I can assist you in the future.   These are the goals we discussed: Goals    . Weight (lb) < 160 lb (72.6 kg)          Diet and exercise       This is a list of the screening recommended for you and due dates:  Health Maintenance  Topic Date Due  . Pneumonia vaccines (2 of 2 - PPSV23) 05/22/2016  . Flu Shot  05/25/2017*  . Mammogram  06/26/2017  . Tetanus Vaccine  09/24/2018  . DEXA scan (bone density measurement)  Completed  *Topic was postponed. The date shown is not the original due date.    Keep checking your blood pressure at home and let us know if the top number is greater than 150.  Continue to eat heart healthy diet (full of fruits, vegetables, whole grains, lean protein, water--limit salt, fat, and sugar intake) and increase physical activity as tolerated.  Continue doing brain stimulating activities (puzzles, reading, adult coloring books, staying active) to keep memory sharp.   Schedule mammogram. Order placed today. Health Maintenance for Postmenopausal Women Menopause is a normal process in which your reproductive ability comes to an end. This process happens gradually over a span of months to years, usually between the ages of 7 and 87. Menopause is complete when you have missed 12 consecutive menstrual periods. It is important to talk with your health care provider about some of the most common conditions that affect postmenopausal women, such as heart disease, cancer, and bone loss (osteoporosis). Adopting a healthy lifestyle and getting preventive care can help to promote your health and wellness. Those actions can also lower your chances of developing some of these common conditions. What should I know about menopause? During menopause, you may experience a  number of symptoms, such as:  Moderate-to-severe hot flashes.  Night sweats.  Decrease in sex drive.  Mood swings.  Headaches.  Tiredness.  Irritability.  Memory problems.  Insomnia. Choosing to treat or not to treat menopausal changes is an individual decision that you make with your health care provider. What should I know about hormone replacement therapy and supplements? Hormone therapy products are effective for treating symptoms that are associated with menopause, such as hot flashes and night sweats. Hormone replacement carries certain risks, especially as you become older. If you are thinking about using estrogen or estrogen with progestin treatments, discuss the benefits and risks with your health care provider. What should I know about heart disease and stroke? Heart disease, heart attack, and stroke become more likely as you age. This may be due, in part, to the hormonal changes that your body experiences during menopause. These can affect how your body processes dietary fats, triglycerides, and cholesterol. Heart attack and stroke are both medical emergencies. There are many things that you can do to help prevent heart disease and stroke:  Have your blood pressure checked at least every 1-2 years. High blood pressure causes heart disease and increases the risk of stroke.  If you are 66-12 years old, ask your health care provider if you should take aspirin to prevent a heart attack or a stroke.  Do not use any tobacco products, including cigarettes, chewing tobacco, or electronic cigarettes. If you need help quitting, ask your  health care provider.  It is important to eat a healthy diet and maintain a healthy weight.  Be sure to include plenty of vegetables, fruits, low-fat dairy products, and lean protein.  Avoid eating foods that are high in solid fats, added sugars, or salt (sodium).  Get regular exercise. This is one of the most important things that you can do  for your health.  Try to exercise for at least 150 minutes each week. The type of exercise that you do should increase your heart rate and make you sweat. This is known as moderate-intensity exercise.  Try to do strengthening exercises at least twice each week. Do these in addition to the moderate-intensity exercise.  Know your numbers.Ask your health care provider to check your cholesterol and your blood glucose. Continue to have your blood tested as directed by your health care provider. What should I know about cancer screening? There are several types of cancer. Take the following steps to reduce your risk and to catch any cancer development as early as possible. Breast Cancer  Practice breast self-awareness.  This means understanding how your breasts normally appear and feel.  It also means doing regular breast self-exams. Let your health care provider know about any changes, no matter how small.  If you are 36 or older, have a clinician do a breast exam (clinical breast exam or CBE) every year. Depending on your age, family history, and medical history, it may be recommended that you also have a yearly breast X-ray (mammogram).  If you have a family history of breast cancer, talk with your health care provider about genetic screening.  If you are at high risk for breast cancer, talk with your health care provider about having an MRI and a mammogram every year.  Breast cancer (BRCA) gene test is recommended for women who have family members with BRCA-related cancers. Results of the assessment will determine the need for genetic counseling and BRCA1 and for BRCA2 testing. BRCA-related cancers include these types:  Breast. This occurs in males or females.  Ovarian.  Tubal. This may also be called fallopian tube cancer.  Cancer of the abdominal or pelvic lining (peritoneal cancer).  Prostate.  Pancreatic. Cervical, Uterine, and Ovarian Cancer  Your health care provider may  recommend that you be screened regularly for cancer of the pelvic organs. These include your ovaries, uterus, and vagina. This screening involves a pelvic exam, which includes checking for microscopic changes to the surface of your cervix (Pap test).  For women ages 21-65, health care providers may recommend a pelvic exam and a Pap test every three years. For women ages 58-65, they may recommend the Pap test and pelvic exam, combined with testing for human papilloma virus (HPV), every five years. Some types of HPV increase your risk of cervical cancer. Testing for HPV may also be done on women of any age who have unclear Pap test results.  Other health care providers may not recommend any screening for nonpregnant women who are considered low risk for pelvic cancer and have no symptoms. Ask your health care provider if a screening pelvic exam is right for you.  If you have had past treatment for cervical cancer or a condition that could lead to cancer, you need Pap tests and screening for cancer for at least 20 years after your treatment. If Pap tests have been discontinued for you, your risk factors (such as having a new sexual partner) need to be reassessed to determine if you should  start having screenings again. Some women have medical problems that increase the chance of getting cervical cancer. In these cases, your health care provider may recommend that you have screening and Pap tests more often.  If you have a family history of uterine cancer or ovarian cancer, talk with your health care provider about genetic screening.  If you have vaginal bleeding after reaching menopause, tell your health care provider.  There are currently no reliable tests available to screen for ovarian cancer. Lung Cancer  Lung cancer screening is recommended for adults 9-21 years old who are at high risk for lung cancer because of a history of smoking. A yearly low-dose CT scan of the lungs is recommended if  you:  Currently smoke.  Have a history of at least 30 pack-years of smoking and you currently smoke or have quit within the past 15 years. A pack-year is smoking an average of one pack of cigarettes per day for one year. Yearly screening should:  Continue until it has been 15 years since you quit.  Stop if you develop a health problem that would prevent you from having lung cancer treatment. Colorectal Cancer  This type of cancer can be detected and can often be prevented.  Routine colorectal cancer screening usually begins at age 61 and continues through age 34.  If you have risk factors for colon cancer, your health care provider may recommend that you be screened at an earlier age.  If you have a family history of colorectal cancer, talk with your health care provider about genetic screening.  Your health care provider may also recommend using home test kits to check for hidden blood in your stool.  A small camera at the end of a tube can be used to examine your colon directly (sigmoidoscopy or colonoscopy). This is done to check for the earliest forms of colorectal cancer.  Direct examination of the colon should be repeated every 5-10 years until age 45. However, if early forms of precancerous polyps or small growths are found or if you have a family history or genetic risk for colorectal cancer, you may need to be screened more often. Skin Cancer  Check your skin from head to toe regularly.  Monitor any moles. Be sure to tell your health care provider:  About any new moles or changes in moles, especially if there is a change in a mole's shape or color.  If you have a mole that is larger than the size of a pencil eraser.  If any of your family members has a history of skin cancer, especially at a young age, talk with your health care provider about genetic screening.  Always use sunscreen. Apply sunscreen liberally and repeatedly throughout the day.  Whenever you are  outside, protect yourself by wearing long sleeves, pants, a wide-brimmed hat, and sunglasses. What should I know about osteoporosis? Osteoporosis is a condition in which bone destruction happens more quickly than new bone creation. After menopause, you may be at an increased risk for osteoporosis. To help prevent osteoporosis or the bone fractures that can happen because of osteoporosis, the following is recommended:  If you are 51-43 years old, get at least 1,000 mg of calcium and at least 600 mg of vitamin D per day.  If you are older than age 27 but younger than age 43, get at least 1,200 mg of calcium and at least 600 mg of vitamin D per day.  If you are older than age 8, get  at least 1,200 mg of calcium and at least 800 mg of vitamin D per day. Smoking and excessive alcohol intake increase the risk of osteoporosis. Eat foods that are rich in calcium and vitamin D, and do weight-bearing exercises several times each week as directed by your health care provider. What should I know about how menopause affects my mental health? Depression may occur at any age, but it is more common as you become older. Common symptoms of depression include:  Low or sad mood.  Changes in sleep patterns.  Changes in appetite or eating patterns.  Feeling an overall lack of motivation or enjoyment of activities that you previously enjoyed.  Frequent crying spells. Talk with your health care provider if you think that you are experiencing depression. What should I know about immunizations? It is important that you get and maintain your immunizations. These include:  Tetanus, diphtheria, and pertussis (Tdap) booster vaccine.  Influenza every year before the flu season begins.  Pneumonia vaccine.  Shingles vaccine. Your health care provider may also recommend other immunizations. This information is not intended to replace advice given to you by your health care provider. Make sure you discuss any  questions you have with your health care provider. Document Released: 11/02/2005 Document Revised: 03/30/2016 Document Reviewed: 06/14/2015 Elsevier Interactive Patient Education  2017 [employer].

## 2017-02-17 ENCOUNTER — Other Ambulatory Visit: Payer: Self-pay | Admitting: Family

## 2017-02-19 NOTE — Telephone Encounter (Signed)
Refill sent per [facility] refill protocol/SLS  

## 2017-02-19 NOTE — Telephone Encounter (Signed)
Seen [phone] 

## 2017-03-19 ENCOUNTER — Other Ambulatory Visit: Payer: Self-pay | Admitting: Physician Assistant

## 2017-03-19 DIAGNOSIS — R0602 Shortness of breath: Secondary | ICD-10-CM

## 2017-05-20 ENCOUNTER — Encounter: Payer: Self-pay | Admitting: Family

## 2017-05-20 ENCOUNTER — Ambulatory Visit (INDEPENDENT_AMBULATORY_CARE_PROVIDER_SITE_OTHER): Payer: Medicare Other | Admitting: Family

## 2017-05-20 VITALS — BP 133/68 | HR 94 | Temp 98.5°F | Ht 67.0 in | Wt 181.0 lb

## 2017-05-20 DIAGNOSIS — B029 Zoster without complications: Secondary | ICD-10-CM | POA: Diagnosis not present

## 2017-05-20 DIAGNOSIS — R0602 Shortness of breath: Secondary | ICD-10-CM

## 2017-05-20 MED ORDER — GABAPENTIN 100 MG PO CAPS
100.0000 mg | ORAL_CAPSULE | Freq: Three times a day (TID) | ORAL | 0 refills | Status: DC
Start: 2017-05-20 — End: 2018-01-09

## 2017-05-20 MED ORDER — METOPROLOL SUCCINATE ER 50 MG PO TB24: ORAL_TABLET | ORAL | 0 refills | Status: DC

## 2017-05-20 MED ORDER — ZOSTER VAC RECOMB ADJUVANTED 50 MCG/0.5ML IM SUSR: INTRAMUSCULAR | 1 refills | Status: DC

## 2017-05-20 NOTE — Progress Notes (Signed)
Subjective:    Patient ID: Cassandra Miles, female    DOB: 05-08-1941, 76 y.o.   MRN: [id number]  HPI  Cassandra Miles is a 76 yr old female who presents today to discuss painful rash which began on 05/14/17.  Rash is located on the right upper back.  No improvement in pain with otc creams/compresses. Pain is described as moderate:  5/10.   Review of Systems See HPI  Past Medical History:  Diagnosis Date  . Atypical chest pain    a. 09/2013 Admitted -> Negative stress echo, EF 60%, no rwma.  . Avascular necrosis ([place])   . Barrett esophagus   . Blood transfusion without reported diagnosis    WITH  HIP REPLACEMENT 06,07  . Cataract   . Depression   . GERD (gastroesophageal reflux disease)   . Hepatic cyst   . Hiatal hernia   . Hyperlipidemia   . Hypertension   . Osteoarthritis    a. knee  . PAF (paroxysmal atrial fibrillation) ([place])    a. 1 documented episode in 2008.  Marland Kitchen Palpitations    a. 08/2013 event monitor: no events but was noncompliant;  b. metoprolol added 09/2013.  Marland Kitchen Peptic ulcer disease   . Restless leg syndrome   . Sleep apnea   . Spinal stenosis   . Vitamin D deficiency      Social History   Social History  . Marital status: Divorced    Spouse name: N/A  . Number of children: N/A  . Years of education: N/A   Occupational History  . retired Retired    Pharmacist, hospital   Social History Main Topics  . Smoking status: Never Smoker  . Smokeless tobacco: Never Used  . Alcohol use 0.0 oz/week  . Drug use: No  . Sexual activity: No   Other Topics Concern  . Not on file   Social History Narrative   Moved from [place], [place]   retired Education officer, museum   Lives alone- divorced since 1992   Has some cousins near- one cousin in [place], one in [place]   One son in [place], one son in [place], Mental illness- was incarcerated as a teen.      Past Surgical History:  Procedure Laterality Date  . BREAST REDUCTION SURGERY    . CHOLECYSTECTOMY    . COLONOSCOPY      . DECOMPRESSION CORE HIP     bilateral hips  . HIP FRACTURE SURGERY     Bilateral hip replacements  . ORIF WRIST FRACTURE  09/14/2011   Procedure: OPEN REDUCTION INTERNAL FIXATION (ORIF) WRIST FRACTURE;  Surgeon: Willa Frater III;  Location: [facility] ORS;  Service: Orthopedics;  Laterality: N/A;  . TUBAL LIGATION      Family History  Problem Relation Age of Onset  . Arthritis Father   . Stroke Mother        x2  . Heart disease Mother   . Heart disease Brother        Stenting   . Hyperlipidemia Brother   . Melanoma Brother   . Colon cancer Paternal Aunt        x 2  . Diabetes Brother     Allergies  Allergen Reactions  . Bupropion Hcl Other (See Comments)    Made her feel bad  . Ciprofloxacin Nausea And Vomiting    REACTION: hives  . Penicillins Rash    REACTION: rash    Current Outpatient Prescriptions on File Prior to Visit  Medication  Sig Dispense Refill  . acetaminophen (TYLENOL) 325 MG tablet Take 2 tablets (650 mg total) by mouth every 6 (six) hours as needed for mild pain (or Fever >/= 101).    Marland Kitchen aspirin EC 81 MG tablet Take 1 tablet (81 mg total) by mouth daily. 30 tablet 3  . Cholecalciferol (VITAMIN D3) 3000 units TABS Take 1 tablet by mouth daily. (Patient not taking: Reported on 02/15/2017) 30 tablet 5  . lisinopril (PRINIVIL,ZESTRIL) 5 MG tablet take 1 tablet by mouth once daily 90 tablet 1  . metoprolol succinate (TOPROL-XL) 50 MG 24 hr tablet take 1 tablet by mouth once daily WITH OR IMMEDIATELY FOLLOWING A MEAL 30 tablet 0  . Multiple Vitamin (MULTIVITAMIN) tablet Take 1 tablet by mouth daily.    Marland Kitchen omeprazole (PRILOSEC) 40 MG capsule Take 40 mg by mouth daily as needed (AS NEEDED FOR ACID REFLUX).    Marland Kitchen rOPINIRole (REQUIP) 1 MG tablet take 1 tablet by mouth at bedtime 30 tablet 5  . sertraline (ZOLOFT) 100 MG tablet Take 1 tablet (100 mg total) by mouth daily. 30 tablet 5   Current Facility-Administered Medications on File Prior to Visit  Medication Dose  Route Frequency Provider Last Rate Last Dose  . 0.9 %  sodium chloride infusion  500 mL Intravenous Continuous Danis, Estill Cotta III, MD        BP 133/68   Pulse 94   Temp 98.5 F (36.9 C) (Oral)   Ht 5\' 7"  (1.702 m)   Wt 181 lb (82.1 kg)   SpO2 94%   BMI 28.35 kg/m       Objective:   Physical Exam  Constitutional: She is oriented to person, place, and time. She appears well-developed and well-nourished. No distress.  Neurological: She is alert and oriented to person, place, and time.  Skin: Skin is warm and dry.     Vesicular rash noted right lateral breast/right axilla and right upper back.           Assessment & Plan:  Herpes Zoster- outside of window for antiviral treatment. Will rx with gabapentin for pain. She is in the process of relocating to [place] and will establish with a provider there for ongoing treatment. I did give her an rx for Shingrix to initiate after she heals from this bout of shingles.

## 2017-05-20 NOTE — Patient Instructions (Signed)
Please begin gabapentin for pain. Good luck in [place]!

## 2017-05-24 ENCOUNTER — Encounter (HOSPITAL_COMMUNITY): Payer: Self-pay | Admitting: Emergency Medicine

## 2017-05-24 ENCOUNTER — Emergency Department (HOSPITAL_COMMUNITY)
Admission: EM | Admit: 2017-05-24 | Discharge: 2017-05-24 | Disposition: A | Discharge status: Home / Self Care | Payer: Medicare Other | Attending: Emergency Medicine | Admitting: Emergency Medicine

## 2017-05-24 DIAGNOSIS — Z7982 Long term (current) use of aspirin: Secondary | ICD-10-CM | POA: Diagnosis not present

## 2017-05-24 DIAGNOSIS — H02841 Edema of right upper eyelid: Secondary | ICD-10-CM | POA: Diagnosis present

## 2017-05-24 DIAGNOSIS — L03213 Periorbital cellulitis: Secondary | ICD-10-CM | POA: Diagnosis not present

## 2017-05-24 DIAGNOSIS — I10 Essential (primary) hypertension: Secondary | ICD-10-CM | POA: Diagnosis not present

## 2017-05-24 DIAGNOSIS — Z79899 Other long term (current) drug therapy: Secondary | ICD-10-CM | POA: Diagnosis not present

## 2017-05-24 MED ORDER — TETRACAINE HCL 0.5 % OP SOLN
1.0000 [drp] | Freq: Once | OPHTHALMIC | Status: AC
  Administered 2017-05-24: 1 [drp] via OPHTHALMIC
  Filled 2017-05-24: qty 4

## 2017-05-24 MED ORDER — FLUORESCEIN SODIUM 0.6 MG OP STRP
1.0000 | ORAL_STRIP | Freq: Once | OPHTHALMIC | Status: AC
  Administered 2017-05-24: 1 via OPHTHALMIC
  Filled 2017-05-24: qty 1

## 2017-05-24 MED ORDER — CLINDAMYCIN HCL 300 MG PO CAPS
300.0000 mg | ORAL_CAPSULE | Freq: Once | ORAL | Status: AC
  Administered 2017-05-24: 300 mg via ORAL
  Filled 2017-05-24: qty 1

## 2017-05-24 MED ORDER — CLINDAMYCIN HCL 150 MG PO CAPS: 300.0000 mg | ORAL_CAPSULE | Freq: Three times a day (TID) | ORAL | 0 refills | Status: AC

## 2017-05-24 NOTE — Discharge Instructions (Signed)
Please take all of your antibiotics until finished!   You may develop abdominal discomfort or diarrhea from the antibiotic.  You may help offset this with probiotics which you can buy or get in yogurt. Do not eat  or take the probiotics until 2 hours after your antibiotic.   Apply warm or cold compresses to the eye 2-3 times daily. You may take ibuprofen or Tylenol as needed for pain. Follow-up with your primary care physician for reevaluation this week. Return to the ED immediately if any concerning signs or symptoms develop such as headache, fevers, vision changes, worsening swelling or redness

## 2017-05-24 NOTE — ED Triage Notes (Signed)
Pt states she was diagnosed with shingles on Monday by her PCP  Pt states this morning she woke up and her right eyelid was red, swollen and painful  Pt has redness noted to her right eyelid  Pt states last Tuesday her right axilla felt irritated and then the rash started on Thursday  Pt states she was given Gabapentin by her PCP Monday

## 2017-05-24 NOTE — ED Provider Notes (Signed)
[facility] DEPT Provider Note   CSN: [id number] Arrival date & time: 05/24/17  0539     History   Chief Complaint Chief Complaint  Patient presents with  . Herpes Zoster    HPI Cassandra Miles is a 76 y.o. female who presents today with chief complaint acute onset, constant right upper lid swelling and erythema which began earlier today. She was diagnosed with shingles secondary to a rash that appeared on 05/14/2017 but was out of the window for antiviral treatment. She is taking gabapentin for the pain. Today she awoke with swelling and erythema to the right upper eyelid, which she states is only mildly painful when she blinks. Pain does not radiate He also endorses excessive tearing but denies vision changes, photophobia, or abnormal drainage yesterday. No fevers or chills, no worsening of rash. Has not tried anything for her symptoms. She denies foreign body sensation and does not wear contacts. She has not taken her blood pressure medication today and states that she is under a lot of stress, which she thinks is contributing to her hypertension today.  The history is provided by the patient.    Past Medical History:  Diagnosis Date  . Atypical chest pain    a. 09/2013 Admitted -> Negative stress echo, EF 60%, no rwma.  . Avascular necrosis ([place])   . Barrett esophagus   . Blood transfusion without reported diagnosis    WITH  HIP REPLACEMENT 06,07  . Cataract   . Depression   . GERD (gastroesophageal reflux disease)   . Hepatic cyst   . Hiatal hernia   . Hyperlipidemia   . Hypertension   . Osteoarthritis    a. knee  . PAF (paroxysmal atrial fibrillation) ([place])    a. 1 documented episode in 2008.  Marland Kitchen Palpitations    a. 08/2013 event monitor: no events but was noncompliant;  b. metoprolol added 09/2013.  Marland Kitchen Peptic ulcer disease   . Restless leg syndrome   . Sleep apnea   . Spinal stenosis   . Vitamin D deficiency     Patient Active Problem List   Diagnosis Date Noted    . Great toe pain 04/10/2016  . Preventative health care 05/23/2015  . Noncompliance   . GERD (gastroesophageal reflux disease) 10/06/2013  . Hyperlipidemia 08/24/2013  . Balance problem 12/18/2012  . Low back pain 12/18/2012  . Vitamin D deficiency 09/11/2011  . HTN (hypertension) 08/15/2011  . Obstructive sleep apnea 10/31/2010  . OBESITY 06/19/2010  . RESTLESS LEGS SYNDROME 06/19/2010  . Paroxysmal atrial fibrillation ([place]) 06/19/2010  . HIATAL HERNIA 06/19/2010  . DIVERTICULOSIS, COLON 06/19/2010  . Depression 06/13/2010  . TEMPOROMANDIBULAR JOINT PAIN 06/13/2010  . HEPATIC CYST 06/13/2010  . OSTEOARTHRITIS 06/13/2010  . AVASCULAR NECROSIS 06/13/2010  . BARRETT'S ESOPHAGUS, HX OF 06/13/2010    Past Surgical History:  Procedure Laterality Date  . BREAST REDUCTION SURGERY    . CHOLECYSTECTOMY    . COLONOSCOPY    . DECOMPRESSION CORE HIP     bilateral hips  . HIP FRACTURE SURGERY     Bilateral hip replacements  . ORIF WRIST FRACTURE  09/14/2011   Procedure: OPEN REDUCTION INTERNAL FIXATION (ORIF) WRIST FRACTURE;  Surgeon: Willa Frater III;  Location: [facility];  Service: Orthopedics;  Laterality: N/A;  . TUBAL LIGATION      OB History    No data available       Home Medications    Prior to Admission medications   Medication Sig  Start Date End Date Taking? Authorizing Provider  aspirin EC 81 MG tablet Take 1 tablet (81 mg total) by mouth daily. 08/24/13  Yes Dorothy Spark, MD  calcium carbonate (TUMS - DOSED IN MG ELEMENTAL CALCIUM) 500 MG chewable tablet Chew 2 tablets by mouth every 4 (four) hours as needed for indigestion or heartburn.   Yes [provider]  diphenhydramine-acetaminophen (TYLENOL PM) 25-500 MG TABS tablet Take 1 tablet by mouth at bedtime as needed. sleep   Yes [provider]  gabapentin (NEURONTIN) 100 MG capsule Take 1 capsule (100 mg total) by mouth 3 (three) times daily. 05/20/17  Yes Debbrah Alar, NP   lisinopril (PRINIVIL,ZESTRIL) 5 MG tablet take 1 tablet by mouth once daily 02/19/17  Yes Debbrah Alar, NP  metoprolol succinate (TOPROL-XL) 50 MG 24 hr tablet take 1 tablet by mouth once daily WITH OR IMMEDIATELY FOLLOWING A MEAL Patient taking differently: Take 50 mg by mouth daily.  05/20/17  Yes Debbrah Alar, NP  naproxen sodium (ANAPROX) 220 MG tablet Take 440 mg by mouth 2 (two) times daily as needed. Pain   Yes [provider]  rOPINIRole (REQUIP) 1 MG tablet take 1 tablet by mouth at bedtime 01/30/17  Yes Debbrah Alar, NP  sertraline (ZOLOFT) 100 MG tablet Take 1 tablet (100 mg total) by mouth daily. 03/14/16  Yes Debbrah Alar, NP  acetaminophen (TYLENOL) 325 MG tablet Take 2 tablets (650 mg total) by mouth every 6 (six) hours as needed for mild pain (or Fever >/= 101). Patient not taking: Reported on 05/24/2017 10/06/13   Delfina Redwood, MD  Cholecalciferol (VITAMIN D3) 3000 units TABS Take 1 tablet by mouth daily. Patient not taking: Reported on 02/15/2017 06/13/16   Debbrah Alar, NP  clindamycin (CLEOCIN) 150 MG capsule Take 2 capsules (300 mg total) by mouth 3 (three) times daily. 05/24/17 06/03/17  Rodell Perna A, PA-C  Zoster Vac Recomb Adjuvanted [facility]) injection Inject 28mcg IM now and again in 2-6 months 05/20/17   Debbrah Alar, NP    Family History Family History  Problem Relation Age of Onset  . Arthritis Father   . Stroke Mother        x2  . Heart disease Mother   . Heart disease Brother        Stenting   . Hyperlipidemia Brother   . Melanoma Brother   . Colon cancer Paternal Aunt        x 2  . Diabetes Brother     Social History Social History  Substance Use Topics  . Smoking status: Never Smoker  . Smokeless tobacco: Never Used  . Alcohol use No     Allergies   Bupropion hcl; Ciprofloxacin; and Penicillins   Review of Systems Review of Systems  Constitutional: Negative for chills and fever.  HENT:  Negative for facial swelling.   Eyes: Positive for pain and redness (R upper eyelid). Negative for photophobia, discharge, itching and visual disturbance.  Neurological: Negative for headaches.  All other systems reviewed and are negative.    Physical Exam Updated Vital Signs BP (!) 195/94 (BP Location: Left Arm)   Pulse 99   Temp 98.1 F (36.7 C) (Oral)   Resp 16   SpO2 99%   Physical Exam  Constitutional: She is oriented to person, place, and time. She appears well-developed and well-nourished. No distress.  HENT:  Head: Normocephalic and atraumatic.  Right Ear: External ear normal.  Left Ear: External ear normal.  Nose: Nose normal.  Mouth/Throat: Oropharynx is clear and moist.  No vesicles to the ear canals or tip of the nose; Hutchinson's sign absent. Nasal septum is midline with pink mucosa.   Eyes: Pupils are equal, round, and reactive to light. Conjunctivae are normal. Right eye exhibits no discharge. Left eye exhibits no discharge.  Right upper eyelid mildly swollen and erythematous. Mildly tender to palpation. No abnormal drainage. No proptosis, no chemosis, no consensual photophobia. Pain present with movement of the right eye laterally. Peripheral visual fields intact. On fluorescein stain, no foreign bodies, no corneal ulcerations or abrasions, no rust rings, no dendritic lesions.   Neck: Normal range of motion. Neck supple. No JVD present. No tracheal deviation present.  Cardiovascular: Normal rate.   Pulmonary/Chest: Effort normal.  Abdominal: She exhibits no distension.  Musculoskeletal: She exhibits no edema.  Neurological: She is alert and oriented to person, place, and time. No cranial nerve deficit.  Skin: Skin is warm and dry. No erythema.  Psychiatric: She has a normal mood and affect. Her behavior is normal.  Nursing note and vitals reviewed.    ED Treatments / Results  Labs (all labs ordered are listed, but only abnormal results are displayed) Labs  Reviewed - No data to display  EKG  EKG Interpretation None       Radiology No results found.  Procedures Procedures (including critical care time)  Medications Ordered in ED Medications  fluorescein ophthalmic strip 1 strip (1 strip Right Eye Given 05/24/17 0644)  tetracaine (PONTOCAINE) 0.5 % ophthalmic solution 1 drop (1 drop Right Eye Given 05/24/17 0644)  clindamycin (CLEOCIN) capsule 300 mg (300 mg Oral Given 05/24/17 0730)     Initial Impression / Assessment and Plan / ED Course  I have reviewed the triage vital signs and the nursing notes.  Pertinent labs & imaging results that were available during my care of the patient were reviewed by me and considered in my medical decision making (see chart for details).     Patient with right upper eyelid swelling and erythema with mild pain. Afebrile, hypertensive but states she has not taken her BP meds today. No vision changes, Hutchinson sign absent, no dendritic lesions on fluorescein stain. Low suspicion of orbital cellulitis or opthalmic herpes zoster. Will treat with clindamycin, follow-up with primary care physician. Discussed indications for return to the ED. Pt verbalized understanding of and agreement with plan and is safe for discharge home at this time.  Final Clinical Impressions(s) / ED Diagnoses   Final diagnoses:  Preseptal cellulitis of right upper eyelid    New Prescriptions Discharge Medication List as of 05/24/2017  7:28 AM    START taking these medications   Details  clindamycin (CLEOCIN) 150 MG capsule Take 2 capsules (300 mg total) by mouth 3 (three) times daily., Starting Fri 05/24/2017, Until Mon 06/03/2017, Print         Modest Draeger, [place] A, PA-C 05/24/17 3762    Everlene Balls, MD 05/29/17 [phone]

## 2017-09-13 ENCOUNTER — Telehealth: Payer: Self-pay | Admitting: *Deleted

## 2017-09-13 MED ORDER — LISINOPRIL 5 MG PO TABS: 5.0000 mg | ORAL_TABLET | Freq: Every day | ORAL | 0 refills | Status: DC

## 2017-09-13 NOTE — Telephone Encounter (Signed)
Received fax from [facility] in [place] requesting refill of lisinopril. Last OV in August stated pt was relocating to [place]. Sent 30 day supply. Please call pt and advise her that she needs to establish with PCP in [facility] ASAP as we are only able to do 30 day supply since she relocated. Thanks!

## 2017-09-13 NOTE — Telephone Encounter (Signed)
Called pt and phone number would not go through. Please advise on how to proceed.

## 2017-10-22 ENCOUNTER — Other Ambulatory Visit: Payer: Self-pay | Admitting: Family

## 2018-01-09 ENCOUNTER — Ambulatory Visit: Payer: Medicare Other | Admitting: Obstetrics & Gynecology

## 2018-01-09 ENCOUNTER — Encounter: Payer: Self-pay | Admitting: Obstetrics & Gynecology

## 2018-01-09 VITALS — BP 130/82 | Ht 65.5 in | Wt 191.0 lb

## 2018-01-09 DIAGNOSIS — R102 Pelvic and perineal pain: Secondary | ICD-10-CM

## 2018-01-09 DIAGNOSIS — N813 Complete uterovaginal prolapse: Secondary | ICD-10-CM | POA: Diagnosis not present

## 2018-01-09 NOTE — Patient Instructions (Signed)
1. 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  Patient declines the pessary after counseling.  Would like to proceed with a surgical correction.  Prefers not to use a mesh.  Will probably proceed with a Robotic TLH/BSO/US ligament suspension.  All possible surgical approaches reviewed with patient including a vaginal hysterectomy with McCall's, a sacrospinous suspension and a sacrocolpopexy.  Will make a decision on the surgical approach at the follow-up visit after reviewing the pelvic ultrasound.  Patient voiced understanding and agreement with plan.  2. Pelvic pain in female Left intermittent pelvic pain with fullness in the left adnexa and possible mild postmenopausal bleeding in the last year.  Patient will follow up with a pelvic ultrasound to assess the uterus, with the endometrial lining and the ovaries. - US Transvaginal Non-OB; Future  Cassandra Miles, it was a pleasure meeting you today!  I will see you again soon for your pelvic ultrasound.   Pelvic Organ Prolapse Pelvic organ prolapse is the stretching, bulging, or dropping of pelvic organs into an abnormal position. It happens when the muscles and tissues that surround and support pelvic structures are stretched or weak. Pelvic organ prolapse can involve:  Vagina (vaginal prolapse).  Uterus (uterine prolapse).  Bladder (cystocele).  Rectum (rectocele).  Intestines (enterocele).  When organs other than the vagina are involved, they often bulge into the vagina or protrude from the vagina, depending on how severe the prolapse is. What are the causes? Causes of this condition include:  Pregnancy, labor, and childbirth.  Long-lasting (chronic) cough.  Chronic constipation.  Obesity.  Past pelvic surgery.  Aging. During and after menopause, a decreased production of the hormone estrogen can weaken pelvic ligaments and muscles.  Consistently lifting more than 50 lb (23 kg).  Buildup  of fluid in the abdomen due to certain diseases and other conditions.  What are the signs or symptoms? Symptoms of this condition include:  Loss of bladder control when you cough, sneeze, strain, and exercise (stress incontinence). This may be worse immediately following childbirth, and it may gradually improve over time.  Feeling pressure in your pelvis or vagina. This pressure may increase when you cough or when you are having a bowel movement.  A bulge that protrudes from the opening of your vagina or against your vaginal wall. If your uterus protrudes through the opening of your vagina and rubs against your clothing, you may also experience soreness, ulcers, infection, pain, and bleeding.  Increased effort to have a bowel movement or urinate.  Pain in your low back.  Pain, discomfort, or disinterest in sexual intercourse.  Repeated bladder infections (urinary tract infections).  Difficulty inserting or inability to insert a tampon or applicator.  In some people, this condition does not cause any symptoms. How is this diagnosed? Your health care provider may perform an internal and external vaginal and rectal exam. During the exam, you may be asked to cough and strain while you are lying down, sitting, and standing up. Your health care provider will determine if other tests are required, such as bladder function tests. How is this treated? In most cases, this condition needs to be treated only if it produces symptoms. No treatment is guaranteed to correct the prolapse or relieve the symptoms completely. Treatment may include:  Lifestyle changes, such as: ? Avoiding drinking beverages that contain caffeine. ? Increasing your intake of high-fiber foods. This can help to decrease constipation and straining during bowel movements. ? Emptying your bladder  at scheduled times (bladder training therapy). This can help to reduce or avoid urinary incontinence. ? Losing weight if you are  overweight or obese.  Estrogen. Estrogen may help mild prolapse by increasing the strength and tone of pelvic floor muscles.  Kegel exercises. These may help mild cases of prolapse by strengthening and tightening the muscles of the pelvic floor.  Pessary insertion. A pessary is a soft, flexible device that is placed into your vagina by your health care provider to help support the vaginal walls and keep pelvic organs in place.  Surgery. This is often the only form of treatment for severe prolapse. Different types of surgeries are available.  Follow these instructions at home:  Wear a sanitary pad or absorbent product if you have urinary incontinence.  Avoid heavy lifting and straining with exercise and work. Do not hold your breath when you perform mild to moderate lifting and exercise activities. Limit your activities as directed by your health care provider.  Take medicines only as directed by your health care provider.  Perform Kegel exercises as directed by your health care provider.  If you have a pessary, take care of it as directed by your health care provider. Contact a health care provider if:  Your symptoms interfere with your daily activities or sex life.  You need medicine to help with the discomfort.  You notice bleeding from the vagina that is not related to your period.  You have a fever.  You have pain or bleeding when you urinate.  You have bleeding when you have a bowel movement.  You lose urine when you have sex.  You have chronic constipation.  You have a pessary that falls out.  You have vaginal discharge that has a bad smell.  You have low abdominal pain or cramping that is unusual for you. This information is not intended to replace advice given to you by your health care provider. Make sure you discuss any questions you have with your health care provider. Document Released: 04/07/2014 Document Revised: 02/16/2016 Document Reviewed:  11/23/2013 Elsevier Interactive Patient Education  [employer].

## 2018-01-09 NOTE — Progress Notes (Signed)
    Cassandra Miles Jan 29, 1941 [id number]        77 y.o.  G2P2  Lives in [place]  RP: Uterine prolapse and pelvic pain  HPI: Menopausal, well on no hormonal therapy.  Possible postmenopausal spotting x2 in the past year, but not sure if it was coming from the vagina or the rectum.  Complains of worsening bulging with discomfort in the vagina.  Lately, has felt the bulge at the vulva when physically active.  Abstinent.  Also complains of pain intermittently on the left side for a few months. Mild leakage of urine with coughing or effort.  Enjoys walking.  No urinary tract infection symptoms.  No stool incontinence.     OB History  Gravida Para Term Preterm AB Living  2 2       2   SAB TAB Ectopic Multiple Live Births               # Outcome Date GA Lbr Len/2nd Weight Sex Delivery Anes PTL Lv  2 Para           1 Para             Past medical history,surgical history, problem list, medications, allergies, family history and social history were all reviewed and documented in the EPIC chart.   Directed ROS with pertinent positives and negatives documented in the history of present illness/assessment and plan.  Exam:  Vitals:   01/09/18 1201  BP: 130/82  Weight: 191 lb (86.6 kg)  Height: 5' 5.5" (1.664 m)   General appearance:  Normal  Abdomen: Soft, nontender, no mass felt  Gynecologic exam: Vulva with atrophy.  Speculum exam normal.  No lesion on the cervix.  Bimanual exam with anteverted uterus, normal volume, mobile, nontender.  Right adnexa normal.  Left adnexa with some fullness and tenderness.  With Valsalva in the lying down position, cystocele grade 2 out of 4.  Uterine prolapse grade 1 out of 3.  In the standing position with Valsalva the patient demonstrates a complete uterine prolapse with a high cystocele grade 2 out of 4.   Assessment/Plan:  77 y.o. G2P2   1. 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  Patient  declines the pessary after counseling.  Would like to proceed with a surgical correction.  Prefers not to use a mesh.  Will probably proceed with a Robotic TLH/BSO/US ligament suspension.  All possible surgical approaches reviewed with patient including a vaginal hysterectomy with McCall's, a sacrospinous suspension and a sacrocolpopexy.  Will make a decision on the surgical approach at the follow-up visit after reviewing the pelvic ultrasound.  Patient voiced understanding and agreement with plan.  2. Pelvic pain in female Left intermittent pelvic pain with fullness in the left adnexa and possible mild postmenopausal bleeding in the last year.  Patient will follow up with a pelvic ultrasound to assess the uterus, with the endometrial lining and the ovaries. - US Transvaginal Non-OB; Future  Counseling on above issues and coordination of care more than 50% for 45 minutes.  Princess Bruins MD, 12:11 PM 01/09/2018

## 2018-01-13 ENCOUNTER — Other Ambulatory Visit: Payer: Self-pay | Admitting: Obstetrics & Gynecology

## 2018-01-13 ENCOUNTER — Ambulatory Visit (INDEPENDENT_AMBULATORY_CARE_PROVIDER_SITE_OTHER): Payer: Medicare Other

## 2018-01-13 ENCOUNTER — Ambulatory Visit: Payer: Medicare Other | Admitting: Obstetrics & Gynecology

## 2018-01-13 ENCOUNTER — Encounter: Payer: Self-pay | Admitting: Obstetrics & Gynecology

## 2018-01-13 VITALS — BP 118/90 | Ht 67.0 in | Wt 190.4 lb

## 2018-01-13 DIAGNOSIS — N858 Other specified noninflammatory disorders of uterus: Secondary | ICD-10-CM

## 2018-01-13 DIAGNOSIS — R102 Pelvic and perineal pain: Secondary | ICD-10-CM

## 2018-01-13 DIAGNOSIS — D251 Intramural leiomyoma of uterus: Secondary | ICD-10-CM | POA: Diagnosis not present

## 2018-01-13 DIAGNOSIS — N859 Noninflammatory disorder of uterus, unspecified: Secondary | ICD-10-CM | POA: Diagnosis not present

## 2018-01-13 DIAGNOSIS — N950 Postmenopausal bleeding: Secondary | ICD-10-CM | POA: Diagnosis not present

## 2018-01-13 NOTE — Progress Notes (Signed)
    Cassandra Miles [birth year] [id number]        77 y.o.  G2P2   RP: PMB and Pelvic Pain for Pelvic US  HPI: No change x last visit 01/09/2018.  No further bleeding.  Menopausal, well on no hormonal therapy.  Possible postmenopausal spotting x2 in the past year, but not sure if it was coming from the vagina or the rectum.  Complains of worsening bulging with discomfort in the vagina.  Lately, has felt the bulge at the vulva when physically active.  Abstinent.  Also complains of pain intermittently on the left side for a few months. Mild leakage of urine with coughing or effort.  Enjoys walking.  No urinary tract infection symptoms.  No stool incontinence.     OB History  Gravida Para Term Preterm AB Living  2 2       2   SAB TAB Ectopic Multiple Live Births               # Outcome Date GA Lbr Len/2nd Weight Sex Delivery Anes PTL Lv  2 Para           1 Para             Past medical history,surgical history, problem list, medications, allergies, family history and social history were all reviewed and documented in the EPIC chart.   Directed ROS with pertinent positives and negatives documented in the history of present illness/assessment and plan.  Exam:  Vitals:   01/13/18 1640  Weight: 190 lb 6.4 oz (86.4 kg)  Height: 5\' 7"  (1.702 m)   General appearance:  Normal  Pelvic US today: T/V and T/A images.  Retroverted uterus measuring 5.56 x 4.85 x 4.94 cm.  Solid mass towards the right of the uterus measuring 3.9 x 2.5 x 3.8 cm.  Suggest uterine fibroid, but is slightly irregular contour and texture.  The endometrium is displaced by the mass.  The endometrium is not well seen in all its length, but measured at 2.6 mm in the best visualized area.  Right ovary normal with a very small cystic area measuring 7 mm.  Left ovary not seen.  No free fluid in the posterior cul-de-sac.  Transvaginal ultrasound exam limited by the fact that it was painful to the patient.   Assessment/Plan:  77  y.o. G2P2   1. Postmenopausal bleeding Solid mass towards the right of the uterus measuring 3.9 x 2.5 x 3.8 cm.  Suggestive of a uterine fibroids but slightly irregular in contour and texture.  The endometrium is displaced by the mass.  The endometrium is not well seen in all its length but measures 2.6 mm at the best visualized area.  Decision to complete the investigation with an MRI of the pelvis and a sonohysterogram.  Patient will follow up for sonohysterogram as soon as possible.  Patient was informed of the findings and agrees with plan. - [place] Sonohysterogram; Future  2. Pelvic pain in female We will proceed with an MRI of the pelvis for investigation and will follow up for a sonohysterogram. - [place] Sonohysterogram; Future  3. Uterine mass As above. - [place] Sonohysterogram; Future  Counseling on above findings and coordination of care more than 50% for 15 minutes.  Princess Bruins MD, 4:44 PM 01/13/2018

## 2018-01-15 ENCOUNTER — Inpatient Hospital Stay: Admission: RE | Admit: 2018-01-15 | Payer: Medicare Other | Source: Ambulatory Visit

## 2018-01-15 ENCOUNTER — Telehealth: Payer: Self-pay | Admitting: *Deleted

## 2018-01-15 DIAGNOSIS — N950 Postmenopausal bleeding: Secondary | ICD-10-CM

## 2018-01-15 DIAGNOSIS — N858 Other specified noninflammatory disorders of uterus: Secondary | ICD-10-CM

## 2018-01-15 NOTE — Telephone Encounter (Signed)
-----   Message from Princess Bruins, MD sent at 01/13/2018  5:17 PM EDT ----- Regarding: MRI of Pelvis Irregular probably uterine mass 3.9 cm with PMB.    Please schedule a MRI of Pelvis.  Will need to make sure with Radiology that her bilateral hip replacement is not a contraindication to MRI (Done by Dr Wynelle Link in 2006 and 2007, Op notes in [place]).  CT scan in 2017 could not see the Uterus well.  If the MRI cannot be done, please schedule a Sonohysterogram with EBx with me.  Thanks

## 2018-01-15 NOTE — Telephone Encounter (Signed)
Order placed in epic scheduled at [facility], I will give the patient the number to schedule [phone], pt aware to call and schedule.

## 2018-01-16 ENCOUNTER — Ambulatory Visit
Admission: RE | Admit: 2018-01-16 | Discharge: 2018-01-16 | Disposition: A | Discharge status: Home / Self Care | Payer: Medicare Other | Source: Ambulatory Visit | Attending: Obstetrics & Gynecology | Admitting: Obstetrics & Gynecology

## 2018-01-16 DIAGNOSIS — N858 Other specified noninflammatory disorders of uterus: Secondary | ICD-10-CM

## 2018-01-16 DIAGNOSIS — N950 Postmenopausal bleeding: Secondary | ICD-10-CM

## 2018-01-16 MED ORDER — GADOBENATE DIMEGLUMINE 529 MG/ML IV SOLN
18.0000 mL | Freq: Once | INTRAVENOUS | Status: AC | PRN
  Administered 2018-01-16: 18 mL via INTRAVENOUS

## 2018-01-16 NOTE — Telephone Encounter (Signed)
Patient scheduled today 01/16/18 @ 11:10am

## 2018-01-20 ENCOUNTER — Encounter: Payer: Self-pay | Admitting: Obstetrics & Gynecology

## 2018-01-20 NOTE — Patient Instructions (Signed)
1. Postmenopausal bleeding Solid mass towards the right of the uterus measuring 3.9 x 2.5 x 3.8 cm.  Suggestive of a uterine fibroids but slightly irregular in contour and texture.  The endometrium is displaced by the mass.  The endometrium is not well seen in all its length but measures 2.6 mm at the best visualized area.  Decision to complete the investigation with an MRI of the pelvis and a sonohysterogram.  Patient will follow up for sonohysterogram as soon as possible.  Patient was informed of the findings and agrees with plan. - [place] Sonohysterogram; Future  2. Pelvic pain in female We will proceed with an MRI of the pelvis for investigation and will follow up for a sonohysterogram. - [place] Sonohysterogram; Future  3. Uterine mass As above. - [place] Sonohysterogram; Future  Kristianna, good seeing you today!

## 2018-01-21 ENCOUNTER — Encounter: Payer: Self-pay | Admitting: Obstetrics & Gynecology

## 2018-01-21 ENCOUNTER — Ambulatory Visit (INDEPENDENT_AMBULATORY_CARE_PROVIDER_SITE_OTHER): Payer: Medicare Other | Admitting: Obstetrics & Gynecology

## 2018-01-21 VITALS — BP 124/82

## 2018-01-21 DIAGNOSIS — N813 Complete uterovaginal prolapse: Secondary | ICD-10-CM

## 2018-01-21 DIAGNOSIS — N950 Postmenopausal bleeding: Secondary | ICD-10-CM | POA: Diagnosis not present

## 2018-01-21 NOTE — Patient Instructions (Signed)
1. Postmenopausal bleeding Patient reassured that the uterine mass seen on pelvic ultrasound corresponds to a uterine fibroid on the MRI.  Given the postmenopausal bleeding with the difficulty seeing the endometrial lining on pelvic ultrasound, decision made to proceed with the endometrial biopsy today.  Procedure reviewed with patient.  Endometrial Biopsy done today, well tolerated, no Cx.  Pending results.  Will inform patient of results.  2. Complete uterine prolapse Complete symptomatic uterine prolapse in an active healthy 77 yo woman.  Patient declines pessary.  Recommend Sacrocolpopexy rather than Uterosacral ligament suspension given the severity of the pelvic relaxation with a complete uterine prolapse.  Decision to proceed with XI Robotic Supracervical hysterectomy with BSO and Sacrocolpopexy.  Procedure reviewed with patient and information pamphlet given.  F/U Preop to further review the preop, surgery, risks/benefits and postop.  Cassandra Miles, good seeing you today!  I will inform you of the endometrial biopsy results as soon as they are available.  I will see you soon for the preop visit.

## 2018-01-21 NOTE — Progress Notes (Signed)
Cassandra Miles May 26, 1941 [id number]        77 y.o.  G2P2   RP: Postmenopausal bleeding with difficult to assess Endometrial line  HPI: Very mild PMB x 2 in the past year.  No recent recurrence.  No pelvic pain currently.  Symptomatic Uterine prolapse for which patient desires surgical correction.   OB History  Gravida Para Term Preterm AB Living  2 2       2   SAB TAB Ectopic Multiple Live Births               # Outcome Date GA Lbr Len/2nd Weight Sex Delivery Anes PTL Lv  2 Para           1 Para             Past medical history,surgical history, problem list, medications, allergies, family history and social history were all reviewed and documented in the EPIC chart.   Directed ROS with pertinent positives and negatives documented in the history of present illness/assessment and plan.  Exam:  Vitals:   01/21/18 1446  BP: 124/82   General appearance:  Normal  Gynecologic exam: Vulva normal.  Complete uterine prolapse with cervix protruding outside the vulva as patient made the effort of sitting from laying down position.  Endometrial Biopsy today:  Speculum:  Betadine prep of cervix.  Hurricane spray.  Anterior lip of cervix grasp with tenaculum.  Os finder passed easily at cervix.  Endometrial pipelle inserted in IU cavity.  Sampling of all surfaces as suction is applied.  Specimen is obtain, but small quantity.  Patient cramped, but tolerated the procedure well without Cx.  Instruments removed.  Pelvic US 01/13/2018:  T/V and T/A images.  Retroverted uterus measuring 5.56 x 4.85 x 4.94 cm.  Solid mass towards the right of the uterus measuring 3.9 x 2.5 x 3.8 cm.  Suggest uterine fibroid, but is slightly irregular contour and texture.  The endometrium is displaced by the mass.  The endometrium is not well seen in all its length, but measured at 2.6 mm in the best visualized area.  Right ovary normal with a very small cystic area measuring 7 mm.  Left ovary not seen.  No free  fluid in the posterior cul-de-sac.   MRI of Pelvis 01/16/2018: Reproductive:  Uterus: Measures 8.2 x 3.2 by 4.1 cm (volume = 56 cm^3). Demonstrates a right body/fundal junction intramural to subserosal fibroid. This measures 2.2 x 2.3 by 2.6 cm. This is hypoenhancing. The fibroid displaces the endometrium mildly posteriorly and to the right. No endometrial thickening identified. Normal appearance of the cervix. Right ovary: Normal for age. Left ovary:  Normal. No significant free fluid. Mild pelvic floor laxity.    Assessment/Plan:  77 y.o. G2P2   1. Postmenopausal bleeding Patient reassured that the uterine mass seen on pelvic ultrasound corresponds to a uterine fibroid on the MRI.  Given the postmenopausal bleeding with the difficulty seeing the endometrial lining on pelvic ultrasound, decision made to proceed with the endometrial biopsy today.  Procedure reviewed with patient.  Endometrial Biopsy done today, well tolerated, no Cx.  Pending results.  Will inform patient of results.  2. Complete uterine prolapse Complete symptomatic uterine prolapse in an active healthy 77 yo woman.  Patient declines pessary.  Recommend Sacrocolpopexy rather than Uterosacral ligament suspension given the severity of the pelvic relaxation with a complete uterine prolapse.  Decision to proceed with XI Robotic Supracervical hysterectomy with BSO and Sacrocolpopexy.  Procedure  reviewed with patient and information pamphlet given.  F/U Preop to further review the preop, surgery, risks/benefits and postop.  Counseling on above issues and coordination of care more than 50% for 15 minutes.  Princess Bruins MD, 3:11 PM 01/21/2018

## 2018-01-22 ENCOUNTER — Telehealth: Payer: Self-pay

## 2018-01-22 NOTE — Telephone Encounter (Signed)
Spoke with patient about scheduling surgery. Her ins pays 100% so no surgery prepymt due.  Discussed first available block date with her and she was fine with that date. Scheduled her for 02/26/18 at 8:15am at [facility].  Provided her my direct phone number in event she has any questions.

## 2018-01-23 LAB — PATHOLOGY

## 2018-01-23 LAB — TISSUE SPECIMEN

## 2018-02-12 NOTE — Progress Notes (Deleted)
Subjective:   Cassandra Miles is a 77 y.o. female who presents for Medicare Annual (Subsequent) preventive examination.  Review of Systems: No ROS.  Medicare Wellness Visit. Additional risk factors are reflected in the social history.   Sleep patterns: Home Safety/Smoke Alarms: Feels safe in home. Smoke alarms in place.  Living environment; residence and Firearm Safety:    Female:       Mammo-       Dexa scan-        CCS-utd    Objective:     Vitals: There were no vitals taken for this visit.  There is no height or weight on file to calculate BMI.  Advanced Directives 05/24/2017 02/15/2017 09/30/2015 05/23/2015 10/05/2013 09/14/2011  Does Patient Have a Medical Advance Directive? No Yes Yes Yes Patient has advance directive, copy not in chart Patient would not like information;Patient does not have advance directive  Type of Advance Directive - [place];Living will [place];Living will Living will Living will -  Does patient want to make changes to medical advance directive? - - - - No -  Copy of [place] in Chart? - - - - Copy requested from family -  Would patient like information on creating a medical advance directive? No - Patient declined - - - - -  Pre-existing out of facility DNR order (yellow form or pink MOST form) - - - - No No    Tobacco Social History   Tobacco Use  Smoking Status Never Smoker  Smokeless Tobacco Never Used     Counseling given: Not Answered   Clinical Intake:                       Past Medical History:  Diagnosis Date  . Atypical chest pain    a. 09/2013 Admitted -> Negative stress echo, EF 60%, no rwma.  . Avascular necrosis ([place])   . Barrett esophagus   . Blood transfusion without reported diagnosis    WITH  HIP REPLACEMENT 06,07  . Cataract   . Depression   . GERD (gastroesophageal reflux disease)   . Hepatic cyst   . Hiatal hernia   . Hyperlipidemia   .  Hypertension   . Osteoarthritis    a. knee  . PAF (paroxysmal atrial fibrillation) ([place])    a. 1 documented episode in 2008.  Marland Kitchen Palpitations    a. 08/2013 event monitor: no events but was noncompliant;  b. metoprolol added 09/2013.  Marland Kitchen Peptic ulcer disease   . Restless leg syndrome   . Sleep apnea   . Spinal stenosis   . Vitamin D deficiency    Past Surgical History:  Procedure Laterality Date  . BREAST REDUCTION SURGERY    . CHOLECYSTECTOMY    . COLONOSCOPY    . DECOMPRESSION CORE HIP     bilateral hips  . HIP FRACTURE SURGERY     Bilateral hip replacements  . ORIF WRIST FRACTURE  09/14/2011   Procedure: OPEN REDUCTION INTERNAL FIXATION (ORIF) WRIST FRACTURE;  Surgeon: Willa Frater III;  Location: [facility];  Service: Orthopedics;  Laterality: N/A;  . TUBAL LIGATION     Family History  Problem Relation Age of Onset  . Arthritis Father   . Stroke Mother        x2  . Heart disease Mother   . Heart disease Brother        [place]   . Hyperlipidemia  Brother   . Melanoma Brother   . Colon cancer Paternal Aunt        x 2  . Diabetes Brother    Social History   Socioeconomic History  . Marital status: Divorced    Spouse name: Not on file  . Number of children: Not on file  . Years of education: Not on file  . Highest education level: Not on file  Occupational History  . Occupation: retired    Fish farm manager: RETIRED    Comment: teacher  Social Needs  . Financial resource strain: Not on file  . Food insecurity:    Worry: Not on file    Inability: Not on file  . Transportation needs:    Medical: Not on file    Non-medical: Not on file  Tobacco Use  . Smoking status: Never Smoker  . Smokeless tobacco: Never Used  Substance and Sexual Activity  . Alcohol use: No    Alcohol/week: 0.0 oz  . Drug use: No  . Sexual activity: Not Currently    Comment: 1st intercourse- 20, partners- 27, divorced  Lifestyle  . Physical activity:    Days per week: Not on file    Minutes  per session: Not on file  . Stress: Not on file  Relationships  . Social connections:    Talks on phone: Not on file    Gets together: Not on file    Attends religious service: Not on file    Active member of club or organization: Not on file    Attends meetings of clubs or organizations: Not on file    Relationship status: Not on file  Other Topics Concern  . Not on file  Social History Narrative   Moved from [place], [place]   retired Education officer, museum   Lives alone- divorced since 1992   Has some cousins near- one cousin in [place], one in [place]   One son in [place], one son in [place], Mental illness- was incarcerated as a teen.      Outpatient Encounter Medications as of 02/18/2018  Medication Sig  . atorvastatin (LIPITOR) 10 MG tablet Take 10 mg by mouth daily.  . calcium carbonate (TUMS - DOSED IN MG ELEMENTAL CALCIUM) 500 MG chewable tablet Chew 2 tablets by mouth every 4 (four) hours as needed for indigestion or heartburn.  . Cholecalciferol (VITAMIN D3) 2000 units TABS Take 2,000 mg by mouth daily.  Marland Kitchen dicyclomine (BENTYL) 10 MG capsule Take 10 mg by mouth 3 (three) times daily.  Marland Kitchen lisinopril (PRINIVIL,ZESTRIL) 10 MG tablet Take 10 mg by mouth daily.  Marland Kitchen lisinopril (PRINIVIL,ZESTRIL) 5 MG tablet Take 1 tablet (5 mg total) by mouth daily. (Patient taking differently: Take 10 mg by mouth daily. )  . naproxen sodium (ALEVE) 220 MG tablet Take 220-440 mg by mouth 2 (two) times daily as needed (for arthritis pain.).  Marland Kitchen pantoprazole (PROTONIX) 20 MG tablet Take 20 mg by mouth daily before breakfast.  . rOPINIRole (REQUIP) 1 MG tablet take 1 tablet by mouth at bedtime (Patient taking differently: TAKE 1 TABLET (1 MG) BY MOUTH AT BEDTIME.)  . sertraline (ZOLOFT) 100 MG tablet Take 1 tablet (100 mg total) by mouth daily. (Patient taking differently: Take 150 mg by mouth daily. )  . Zoster Vac Recomb Adjuvanted [facility]) injection Inject 42mcg IM now and again in 2-6 months (Patient  not taking: Reported on 02/12/2018)   Facility-Administered Encounter Medications as of 02/18/2018  Medication  . 0.9 %  sodium  chloride infusion    Activities of Daily Living In your present state of health, do you have any difficulty performing the following activities: 02/15/2017  Hearing? N  Vision? N  Difficulty concentrating or making decisions? N  Walking or climbing stairs? Y  Comment thigh pain  Dressing or bathing? N  Doing errands, shopping? N  Preparing Food and eating ? N  Using the Toilet? N  In the past six months, have you accidently leaked urine? N  Do you have problems with loss of bowel control? Y  Comment takes immodium when needed. States she has sensitive stomach.  Managing your Medications? N  Managing your Finances? N  Housekeeping or managing your Housekeeping? N  Some recent data might be hidden    Patient Care Team: Patient, No Pcp Per as PCP - General (General Practice) Dorothy Spark, MD as Consulting Physician (Cardiology)    Assessment:   This is a routine wellness examination for Cassandra Miles. Physical assessment deferred to PCP.  Exercise Activities and Dietary recommendations   Diet (meal preparation, eat out, water intake, caffeinated beverages, dairy products, fruits and vegetables): {Desc; diets:16563} Breakfast: Lunch:  Dinner:      Goals    . Weight (lb) < 160 lb (72.6 kg)     Diet and exercise       Fall Risk Fall Risk  02/15/2017 04/02/2016 05/23/2015 05/23/2015  Falls in the past year? No Yes No No  Number falls in past yr: - 2 or more - -  Injury with Fall? - No - -   Depression Screen PHQ 2/9 Scores 02/15/2017 04/02/2016 05/23/2015 05/23/2015  PHQ - 2 Score 0 2 0 1  PHQ- 9 Score - 12 - -     Cognitive Function MMSE - Mini Mental State Exam 02/15/2017  Orientation to time 5  Orientation to Place 5  Registration 3  Attention/ Calculation 5  Recall 3  Language- name 2 objects 2  Language- repeat 1  Language- follow 3 step  command 3  Language- read & follow direction 1  Write a sentence 1  Copy design 1  Total score 30        Immunization History  Administered Date(s) Administered  . Influenza-Unspecified 05/25/2012  . Pneumococcal Conjugate-13 05/23/2015    Screening Tests Health Maintenance  Topic Date Due  . PNA vac Low Risk Adult (2 of 2 - PPSV23) 05/22/2016  . INFLUENZA VACCINE  04/24/2018  . TETANUS/TDAP  09/24/2018  . DEXA SCAN  Completed      Plan:   ***   I have personally reviewed and noted the following in the patient's chart:   . Medical and social history . Use of alcohol, tobacco or illicit drugs  . Current medications and supplements . Functional ability and status . Nutritional status . Physical activity . Advanced directives . List of other physicians . Hospitalizations, surgeries, and ER visits in previous 12 months . Vitals . Screenings to include cognitive, depression, and falls . Referrals and appointments  In addition, I have reviewed and discussed with patient certain preventive protocols, quality metrics, and best practice recommendations. A written personalized care plan for preventive services as well as general preventive health recommendations were provided to patient.     Shela Nevin, [place]  02/12/2018

## 2018-02-18 ENCOUNTER — Ambulatory Visit: Payer: Medicare Other | Admitting: *Deleted

## 2018-02-18 NOTE — Patient Instructions (Signed)
[name]  02/18/2018   Your procedure is scheduled on: 02-26-18   Report to [facility] Main  Entrance    Report to admitting at 6:30AM    Call this number if you have problems the morning of surgery [phone]     Remember: Do not eat food or drink liquids :After Midnight.     Take these medicines the morning of surgery with A SIP OF WATER: atorvastatin, pantoprazole, sertraline                                 You may not have any metal on your body including hair pins and              piercings  Do not wear jewelry, make-up, lotions, powders or perfumes, deodorant             Do not wear nail polish.  Do not shave  48 hours prior to surgery.               Do not bring valuables to the hospital. Cassandra Miles.  Contacts, dentures or bridgework may not be worn into surgery.  Leave suitcase in the car. After surgery it may be brought to your room.                 Please read over the following fact sheets you were given: _____________________________________________________________________             [facility] - Preparing for Surgery Before surgery, you can play an important role.  Because skin is not sterile, your skin needs to be as free of germs as possible.  You can reduce the number of germs on your skin by washing with CHG (chlorahexidine gluconate) soap before surgery.  CHG is an antiseptic cleaner which kills germs and bonds with the skin to continue killing germs even after washing. Please DO NOT use if you have an allergy to CHG or antibacterial soaps.  If your skin becomes reddened/irritated stop using the CHG and inform your nurse when you arrive at Short Stay. Do not shave (including legs and underarms) for at least 48 hours prior to the first CHG shower.  You may shave your face/neck. Please follow these instructions carefully:  1.  Shower with CHG Soap the night before surgery and the   morning of Surgery.  2.  If you choose to wash your hair, wash your hair first as usual with your  normal  shampoo.  3.  After you shampoo, rinse your hair and body thoroughly to remove the  shampoo.                           4.  Use CHG as you would any other liquid soap.  You can apply chg directly  to the skin and wash                       Gently with a scrungie or clean washcloth.  5.  Apply the CHG Soap to your body ONLY FROM THE NECK DOWN.   Do not use on face/ open  Wound or open sores. Avoid contact with eyes, ears mouth and genitals (private parts).                       Wash face,  Genitals (private parts) with your normal soap.             6.  Wash thoroughly, paying special attention to the area where your surgery  will be performed.  7.  Thoroughly rinse your body with warm water from the neck down.  8.  DO NOT shower/wash with your normal soap after using and rinsing off  the CHG Soap.                9.  Pat yourself dry with a clean towel.            10.  Wear clean pajamas.            11.  Place clean sheets on your bed the night of your first shower and do not  sleep with pets. Day of Surgery : Do not apply any lotions/deodorants the morning of surgery.  Please wear clean clothes to the hospital/surgery center.  FAILURE TO FOLLOW THESE INSTRUCTIONS MAY RESULT IN THE CANCELLATION OF YOUR SURGERY PATIENT SIGNATURE_________________________________  NURSE SIGNATURE__________________________________  ________________________________________________________________________   Adam Phenix  An incentive spirometer is a tool that can help keep your lungs clear and active. This tool measures how well you are filling your lungs with each breath. Taking long deep breaths may help reverse or decrease the chance of developing breathing (pulmonary) problems (especially infection) following:  A long period of time when you are unable to move or be  active. BEFORE THE PROCEDURE   If the spirometer includes an indicator to show your best effort, your nurse or respiratory therapist will set it to a desired goal.  If possible, sit up straight or lean slightly forward. Try not to slouch.  Hold the incentive spirometer in an upright position. INSTRUCTIONS FOR USE  1. Sit on the edge of your bed if possible, or sit up as far as you can in bed or on a chair. 2. Hold the incentive spirometer in an upright position. 3. Breathe out normally. 4. Place the mouthpiece in your mouth and seal your lips tightly around it. 5. Breathe in slowly and as deeply as possible, raising the piston or the ball toward the top of the column. 6. Hold your breath for 3-5 seconds or for as long as possible. Allow the piston or ball to fall to the bottom of the column. 7. Remove the mouthpiece from your mouth and breathe out normally. 8. Rest for a few seconds and repeat Steps 1 through 7 at least 10 times every 1-2 hours when you are awake. Take your time and take a few normal breaths between deep breaths. 9. The spirometer may include an indicator to show your best effort. Use the indicator as a goal to work toward during each repetition. 10. After each set of 10 deep breaths, practice coughing to be sure your lungs are clear. If you have an incision (the cut made at the time of surgery), support your incision when coughing by placing a pillow or rolled up towels firmly against it. Once you are able to get out of bed, walk around indoors and cough well. You may stop using the incentive spirometer when instructed by your caregiver.  RISKS AND COMPLICATIONS  Take your time so you do not get  dizzy or light-headed.  If you are in pain, you may need to take or ask for pain medication before doing incentive spirometry. It is harder to take a deep breath if you are having pain. AFTER USE  Rest and breathe slowly and easily.  It can be helpful to keep track of a log of  your progress. Your caregiver can provide you with a simple table to help with this. If you are using the spirometer at home, follow these instructions: [place] IF:   You are having difficultly using the spirometer.  You have trouble using the spirometer as often as instructed.  Your pain medication is not giving enough relief while using the spirometer.  You develop fever of 100.5 F (38.1 C) or higher. SEEK IMMEDIATE MEDICAL CARE IF:   You cough up bloody sputum that had not been present before.  You develop fever of 102 F (38.9 C) or greater.  You develop worsening pain at or near the incision site. MAKE SURE YOU:   Understand these instructions.  Will watch your condition.  Will get help right away if you are not doing well or get worse. Document Released: 01/21/2007 Document Revised: 12/03/2011 Document Reviewed: 03/24/2007 [facility] Patient Information 2014 [place], [place].   ________________________________________________________________________

## 2018-02-20 ENCOUNTER — Ambulatory Visit: Payer: Medicare Other | Admitting: Obstetrics & Gynecology

## 2018-02-20 ENCOUNTER — Encounter: Payer: Self-pay | Admitting: Obstetrics & Gynecology

## 2018-02-20 ENCOUNTER — Inpatient Hospital Stay (HOSPITAL_COMMUNITY)
Admission: RE | Admit: 2018-02-20 | Discharge: 2018-02-20 | Disposition: A | Discharge status: Home / Self Care | Payer: Medicare Other | Source: Ambulatory Visit

## 2018-02-20 VITALS — BP 144/90

## 2018-02-20 DIAGNOSIS — N8110 Cystocele, unspecified: Secondary | ICD-10-CM

## 2018-02-20 DIAGNOSIS — N813 Complete uterovaginal prolapse: Secondary | ICD-10-CM

## 2018-02-20 NOTE — Progress Notes (Signed)
    Cassandra Miles 1941-05-12 [id number]        77 y.o.  G2P2L2   RP: Preop for complete Uterine prolapse and Cystocele  HPI:  Bulge rubbing outside at vulva whenever she is standing.  Very active physically and socially.  No SUI.   Menopause on no HRT.  Investigated for PMB in 12/2017.  Pelvic US/MRI showed a probable about 3 cm irregular uterine fibroid, EBx scant, benign.  Pelvic discomfort at times, but no severe pain currently.  Abstinent. BMs wnl.  OB History  Gravida Para Term Preterm AB Living  2 2       2   SAB TAB Ectopic Multiple Live Births               # Outcome Date GA Lbr Len/2nd Weight Sex Delivery Anes PTL Lv  2 Para           1 Para             Past medical history,surgical history, problem list, medications, allergies, family history and social history were all reviewed and documented in the EPIC chart.   Directed ROS with pertinent positives and negatives documented in the history of present illness/assessment and plan.  Exam:  Vitals:   02/20/18 1029  BP: (!) 144/90   General appearance:  Normal  See previous notes 12/2017:  Complete Uterine Prolapse with cervix visible outside the vulva with valsalva/standing.  High Cystocele grade 2/4.   Assessment/Plan:  77 y.o. G2P2   1. Complete uterine prolapse Patient worried about having a Sacrocolpopexy with a mesh.  Decision cancel the surgery and refer to Dr Matilde Sprang for a Sacrospinous suspension using cadaver tissue, while I will do an LAVH/BSO.  May need a Cystocele repair/Sling Procedure, to evaluate with Dr Matilde Sprang.  Surgery, risks and postop recovery thoroughly discussed with patient who voiced understanding and agreement.  2. Baden-Walker grade 2 cystocele To be evaluated by Dr Matilde Sprang.  May need a Cystocele repair/Sling procedure.  Counseling on above issues and coordination of care more than 50% for 25 minutes.  Princess Bruins MD, 10:37 AM 02/20/2018

## 2018-02-21 ENCOUNTER — Telehealth: Payer: Self-pay | Admitting: *Deleted

## 2018-02-21 ENCOUNTER — Encounter: Payer: Self-pay | Admitting: Obstetrics & Gynecology

## 2018-02-21 DIAGNOSIS — N814 Uterovaginal prolapse, unspecified: Secondary | ICD-10-CM

## 2018-02-21 NOTE — Telephone Encounter (Signed)
Referral notes faxed to [facility] they will call patient to schedule. 

## 2018-02-21 NOTE — Patient Instructions (Signed)
1. Complete uterine prolapse Patient worried about having a Sacrocolpopexy with a mesh.  Decision cancel the surgery and refer to Dr Matilde Sprang for a Sacrospinous suspension using cadaver tissue, while I will do an LAVH/BSO.  May need a Cystocele repair/Sling Procedure, to evaluate with Dr Matilde Sprang.  Surgery, risks and postop recovery thoroughly discussed with patient who voiced understanding and agreement.  2. Baden-Walker grade 2 cystocele To be evaluated by Dr Matilde Sprang.  May need a Cystocele repair/Sling procedure.  [place], good seeing you today!

## 2018-02-21 NOTE — Telephone Encounter (Signed)
-----   Message from Princess Bruins, MD sent at 02/20/2018 11:05 AM EDT ----- Regarding: Referral to Dr Hadley Pen Complete Uterine Prolapse.  Declines Sacrocolpopexy.  Refer to Dr Matilde Sprang for Sacrospinous Suspension.  I will do the LAVH/BSO.

## 2018-02-24 ENCOUNTER — Encounter: Payer: Self-pay | Admitting: Anesthesiology

## 2018-02-26 ENCOUNTER — Encounter (HOSPITAL_COMMUNITY): Admission: RE | Payer: Self-pay | Source: Ambulatory Visit

## 2018-02-26 ENCOUNTER — Ambulatory Visit (HOSPITAL_COMMUNITY)
Admission: RE | Admit: 2018-02-26 | Payer: Medicare Other | Source: Ambulatory Visit | Admitting: Obstetrics & Gynecology

## 2018-02-26 SURGERY — HYSTERECTOMY, SUPRACERVICAL, ROBOT-ASSISTED, LAPAROSCOPIC, WITH BILATERAL SALPINGECTOMY
Anesthesia: General

## 2018-03-03 ENCOUNTER — Telehealth: Payer: Self-pay

## 2018-03-03 NOTE — Telephone Encounter (Signed)
Patient called because she went to [facility] and had a test and they scheduled her next appointment with Dr. McDiarmid for 04/10/18. She was asking me about assisting her with getting that appointment sooner in hopes of expediting surgery. I called her back and explained that we have two block robotic days each month. The two in July are days when Dr Raymondo Band is out of his office that week.  The two in August are days when Dr. Marguerita Merles is off. I told her that I have emailed the OR nurse in charge of the block scheduling to see if I might trade my block days in August for two other days.  I will have to wait and hear from her when she returns from vacation next week on the 19th and I will then have to see if those days work with Dr. Lucius Conn schedule. HOpefully we can work something out for August.

## 2018-03-03 NOTE — Telephone Encounter (Signed)
Patient scheduled on 02/28/18 @12 :30pm with Dr.MacDiarmid

## 2018-03-05 NOTE — Telephone Encounter (Signed)
I called patient and per DPR access note on file I left a message that I spoke with OR scheduling head and Dr. McDiarmid's scheduler and we are holding time on 05/20/18 starting at 8:30am for her surgery.  I told her, of course, this is tentative until we have both physicians surgery orders and we schedule surgery. We will wait for her to see Dr. Raymondo Band and proceed from there.

## 2018-03-26 ENCOUNTER — Telehealth: Payer: Self-pay

## 2018-03-26 NOTE — Telephone Encounter (Signed)
Patient is scheduled 04/10/18 to see Dr. McDiarmid to discuss surgery. He will send his surgery order to his scheduler after that pre op appt and things can start to move forward with [place] coordinating surgery. Both schedulers are holding 05/20/18 for her. She called today to ask if she really has to come back to [facility] for that appointment.  She said he has the results of the tests why cant he just schedule the surgery.  I called her back and per DPR access note on file I left her a message explaining that it is her pre operative visit to discuss the surgery and make sure she understands everything including risks, etc.  He will not want to operate on her without meeting with her preoperatively.  I advised her to keep that appointment.

## 2018-04-14 ENCOUNTER — Telehealth: Payer: Self-pay

## 2018-04-14 NOTE — Telephone Encounter (Signed)
Patient said she saw Dr. Matilde Sprang and decision is that he will not be doing urology surgery.  I have put his office visit report on your desk in a yellow folder.  Please advise what surgery I should schedule for her in light of this.  Thanks

## 2018-04-16 NOTE — Telephone Encounter (Signed)
Spoke with patient and advised. She said she is now fine with the mesh. Has several friends who have had surgery with it recently and doing well.  She feels better about it now.  Surgery scheduled for 05/20/18 that I had been holding for her. It is at [facility] and she was told she will be hearing from them with instructions closer to time.  She did not feel like she needed to come back and talk with Dr. Marguerita Merles.

## 2018-04-16 NOTE — Telephone Encounter (Signed)
Given that Dr Matilde Sprang will not perform a Spinous suspension.  I recommend a Robotic supracervical Hysterectomy/BSO with Sacrocolpopexy using a Y mesh.  If patient refuses the mesh, we can do a Robotic TLH/BSO with Uterosacral ligament suspension, but this has a high risk of recurrence of descent.  If needs to discuss again, please set a visit with me.

## 2018-05-15 NOTE — Patient Instructions (Signed)
Cassandra Miles  05/15/2018   Your procedure is scheduled on: 05-20-18   Report to [facility] Main  Entrance    Report to admitting at 6:30AM    Call this number if you have problems the morning of surgery [phone]     Remember: Do not eat food or drink liquids :After Midnight.     Take these medicines the morning of surgery with A SIP OF WATER: atorvastatin, pantoprazole, sertraline                                 You may not have any metal on your body including hair pins and              piercings  Do not wear jewelry, make-up, lotions, powders or perfumes, deodorant             Do not wear nail polish.  Do not shave  48 hours prior to surgery.     Do not bring valuables to the hospital. [place].  Contacts, dentures or bridgework may not be worn into surgery.  Leave suitcase in the car. After surgery it may be brought to your room.                Please read over the following fact sheets you were given: _____________________________________________________________________             [facility] - Preparing for Surgery Before surgery, you can play an important role.  Because skin is not sterile, your skin needs to be as free of germs as possible.  You can reduce the number of germs on your skin by washing with CHG (chlorahexidine gluconate) soap before surgery.  CHG is an antiseptic cleaner which kills germs and bonds with the skin to continue killing germs even after washing. Please DO NOT use if you have an allergy to CHG or antibacterial soaps.  If your skin becomes reddened/irritated stop using the CHG and inform your nurse when you arrive at Short Stay. Do not shave (including legs and underarms) for at least 48 hours prior to the first CHG shower.  You may shave your face/neck. Please follow these instructions carefully:  1.  Shower with CHG Soap the night before surgery and the  morning  of Surgery.  2.  If you choose to wash your hair, wash your hair first as usual with your  normal  shampoo.  3.  After you shampoo, rinse your hair and body thoroughly to remove the  shampoo.                           4.  Use CHG as you would any other liquid soap.  You can apply chg directly  to the skin and wash                       Gently with a scrungie or clean washcloth.  5.  Apply the CHG Soap to your body ONLY FROM THE NECK DOWN.   Do not use on face/ open  Wound or open sores. Avoid contact with eyes, ears mouth and genitals (private parts).                       Wash face,  Genitals (private parts) with your normal soap.             6.  Wash thoroughly, paying special attention to the area where your surgery  will be performed.  7.  Thoroughly rinse your body with warm water from the neck down.  8.  DO NOT shower/wash with your normal soap after using and rinsing off  the CHG Soap.                9.  Pat yourself dry with a clean towel.            10.  Wear clean pajamas.            11.  Place clean sheets on your bed the night of your first shower and do not  sleep with pets. Day of Surgery : Do not apply any lotions/deodorants the morning of surgery.  Please wear clean clothes to the hospital/surgery center.  FAILURE TO FOLLOW THESE INSTRUCTIONS MAY RESULT IN THE CANCELLATION OF YOUR SURGERY PATIENT SIGNATURE_________________________________  NURSE SIGNATURE__________________________________  ________________________________________________________________________   Adam Phenix  An incentive spirometer is a tool that can help keep your lungs clear and active. This tool measures how well you are filling your lungs with each breath. Taking long deep breaths may help reverse or decrease the chance of developing breathing (pulmonary) problems (especially infection) following:  A long period of time when you are unable to move or be  active. BEFORE THE PROCEDURE   If the spirometer includes an indicator to show your best effort, your nurse or respiratory therapist will set it to a desired goal.  If possible, sit up straight or lean slightly forward. Try not to slouch.  Hold the incentive spirometer in an upright position. INSTRUCTIONS FOR USE  1. Sit on the edge of your bed if possible, or sit up as far as you can in bed or on a chair. 2. Hold the incentive spirometer in an upright position. 3. Breathe out normally. 4. Place the mouthpiece in your mouth and seal your lips tightly around it. 5. Breathe in slowly and as deeply as possible, raising the piston or the ball toward the top of the column. 6. Hold your breath for 3-5 seconds or for as long as possible. Allow the piston or ball to fall to the bottom of the column. 7. Remove the mouthpiece from your mouth and breathe out normally. 8. Rest for a few seconds and repeat Steps 1 through 7 at least 10 times every 1-2 hours when you are awake. Take your time and take a few normal breaths between deep breaths. 9. The spirometer may include an indicator to show your best effort. Use the indicator as a goal to work toward during each repetition. 10. After each set of 10 deep breaths, practice coughing to be sure your lungs are clear. If you have an incision (the cut made at the time of surgery), support your incision when coughing by placing a pillow or rolled up towels firmly against it. Once you are able to get out of bed, walk around indoors and cough well. You may stop using the incentive spirometer when instructed by your caregiver.  RISKS AND COMPLICATIONS  Take your time so you do not get  dizzy or light-headed.  If you are in pain, you may need to take or ask for pain medication before doing incentive spirometry. It is harder to take a deep breath if you are having pain. AFTER USE  Rest and breathe slowly and easily.  It can be helpful to keep track of a log of  your progress. Your caregiver can provide you with a simple table to help with this. If you are using the spirometer at home, follow these instructions: [place] IF:   You are having difficultly using the spirometer.  You have trouble using the spirometer as often as instructed.  Your pain medication is not giving enough relief while using the spirometer.  You develop fever of 100.5 F (38.1 C) or higher. SEEK IMMEDIATE MEDICAL CARE IF:   You cough up bloody sputum that had not been present before.  You develop fever of 102 F (38.9 C) or greater.  You develop worsening pain at or near the incision site. MAKE SURE YOU:   Understand these instructions.  Will watch your condition.  Will get help right away if you are not doing well or get worse. Document Released: 01/21/2007 Document Revised: 12/03/2011 Document Reviewed: 03/24/2007 [facility] Patient Information 2014 [place], [place].   ________________________________________________________________________

## 2018-05-16 ENCOUNTER — Encounter (HOSPITAL_COMMUNITY): Payer: Self-pay

## 2018-05-16 ENCOUNTER — Encounter (HOSPITAL_COMMUNITY)
Admission: RE | Admit: 2018-05-16 | Discharge: 2018-05-16 | Disposition: A | Discharge status: Home / Self Care | Payer: Medicare Other | Source: Ambulatory Visit | Attending: Obstetrics & Gynecology | Admitting: Obstetrics & Gynecology

## 2018-05-16 ENCOUNTER — Other Ambulatory Visit: Payer: Self-pay

## 2018-05-16 DIAGNOSIS — N814 Uterovaginal prolapse, unspecified: Secondary | ICD-10-CM | POA: Insufficient documentation

## 2018-05-16 DIAGNOSIS — Z01818 Encounter for other preprocedural examination: Secondary | ICD-10-CM | POA: Diagnosis not present

## 2018-05-16 DIAGNOSIS — I119 Hypertensive heart disease without heart failure: Secondary | ICD-10-CM | POA: Insufficient documentation

## 2018-05-16 DIAGNOSIS — Z01810 Encounter for preprocedural cardiovascular examination: Secondary | ICD-10-CM | POA: Insufficient documentation

## 2018-05-16 LAB — COMPREHENSIVE METABOLIC PANEL
ALBUMIN: 3.4 g/dL — AB (ref 3.5–5.0)
ALK PHOS: 87 U/L (ref 38–126)
ALT: 12 U/L (ref 0–44)
ANION GAP: 7 (ref 5–15)
AST: 16 U/L (ref 15–41)
BUN: 14 mg/dL (ref 8–23)
CALCIUM: 8.7 mg/dL — AB (ref 8.9–10.3)
CO2: 27 mmol/L (ref 22–32)
Chloride: 105 mmol/L (ref 98–111)
Creatinine, Ser: 0.9 mg/dL (ref 0.44–1.00)
GFR calc Af Amer: >60 mL/min (ref >60)
GFR calc non Af Amer: >60 mL/min (ref >60)
GLUCOSE: 92 mg/dL (ref 70–99)
Potassium: 3.9 mmol/L (ref 3.5–5.1)
SODIUM: 139 mmol/L (ref 135–145)
Total Bilirubin: 0.5 mg/dL (ref 0.3–1.2)
Total Protein: 6.8 g/dL (ref 6.5–8.1)

## 2018-05-16 LAB — CBC
HEMATOCRIT: 39.3 % (ref 36.0–46.0)
HEMOGLOBIN: 12.6 g/dL (ref 12.0–15.0)
MCH: 29.7 pg (ref 26.0–34.0)
MCHC: 32.1 g/dL (ref 30.0–36.0)
MCV: 92.7 fL (ref 78.0–100.0)
Platelets: 310 10*3/uL (ref 150–400)
RBC: 4.24 MIL/uL (ref 3.87–5.11)
RDW: 14.7 % (ref 11.5–15.5)
WBC: 9.6 10*3/uL (ref 4.0–10.5)

## 2018-05-16 NOTE — Progress Notes (Signed)
AT PRE-OP APPT TODAY, PATIENT UNABLE TO PROVIDE URINE SPECIMEN FOR URINE PREGNANCY ORDER. PATIENT WAS GIVEN FULL CUP OF WATER TO DRINK AND DRANK ALL BUT STILL UNABLE TO FEEL SENSATION TO URINATE. PATIENT ASKS IF THIS MAY BE DONE DAY OF SURGERY . ORDER TO REMAIN IN Epic FOR AM OF SURGERY

## 2018-05-20 ENCOUNTER — Encounter (HOSPITAL_COMMUNITY): Payer: Self-pay | Admitting: Emergency Medicine

## 2018-05-20 ENCOUNTER — Ambulatory Visit (HOSPITAL_COMMUNITY): Payer: Medicare Other | Admitting: Anesthesiology

## 2018-05-20 ENCOUNTER — Ambulatory Visit (HOSPITAL_COMMUNITY)
Admission: RE | Admit: 2018-05-20 | Discharge: 2018-05-21 | Disposition: A | Discharge status: Home / Self Care | Payer: Medicare Other | Source: Ambulatory Visit | Attending: Obstetrics & Gynecology | Admitting: Obstetrics & Gynecology

## 2018-05-20 ENCOUNTER — Encounter (HOSPITAL_COMMUNITY)
Admission: RE | Disposition: A | Discharge status: Home / Self Care | Payer: Self-pay | Source: Ambulatory Visit | Attending: Obstetrics & Gynecology

## 2018-05-20 DIAGNOSIS — D252 Subserosal leiomyoma of uterus: Secondary | ICD-10-CM | POA: Insufficient documentation

## 2018-05-20 DIAGNOSIS — I4891 Unspecified atrial fibrillation: Secondary | ICD-10-CM | POA: Insufficient documentation

## 2018-05-20 DIAGNOSIS — Z8711 Personal history of peptic ulcer disease: Secondary | ICD-10-CM | POA: Diagnosis not present

## 2018-05-20 DIAGNOSIS — Z881 Allergy status to other antibiotic agents status: Secondary | ICD-10-CM | POA: Diagnosis not present

## 2018-05-20 DIAGNOSIS — Z96649 Presence of unspecified artificial hip joint: Secondary | ICD-10-CM | POA: Insufficient documentation

## 2018-05-20 DIAGNOSIS — Z9889 Other specified postprocedural states: Secondary | ICD-10-CM

## 2018-05-20 DIAGNOSIS — G2581 Restless legs syndrome: Secondary | ICD-10-CM | POA: Insufficient documentation

## 2018-05-20 DIAGNOSIS — K219 Gastro-esophageal reflux disease without esophagitis: Secondary | ICD-10-CM | POA: Diagnosis not present

## 2018-05-20 DIAGNOSIS — Z8249 Family history of ischemic heart disease and other diseases of the circulatory system: Secondary | ICD-10-CM | POA: Diagnosis not present

## 2018-05-20 DIAGNOSIS — Z79899 Other long term (current) drug therapy: Secondary | ICD-10-CM | POA: Diagnosis not present

## 2018-05-20 DIAGNOSIS — N813 Complete uterovaginal prolapse: Secondary | ICD-10-CM | POA: Insufficient documentation

## 2018-05-20 DIAGNOSIS — Z880 Allergy status to penicillin: Secondary | ICD-10-CM | POA: Diagnosis not present

## 2018-05-20 DIAGNOSIS — F329 Major depressive disorder, single episode, unspecified: Secondary | ICD-10-CM | POA: Insufficient documentation

## 2018-05-20 DIAGNOSIS — E559 Vitamin D deficiency, unspecified: Secondary | ICD-10-CM | POA: Insufficient documentation

## 2018-05-20 DIAGNOSIS — E785 Hyperlipidemia, unspecified: Secondary | ICD-10-CM | POA: Diagnosis not present

## 2018-05-20 DIAGNOSIS — G4730 Sleep apnea, unspecified: Secondary | ICD-10-CM | POA: Insufficient documentation

## 2018-05-20 DIAGNOSIS — I10 Essential (primary) hypertension: Secondary | ICD-10-CM | POA: Diagnosis not present

## 2018-05-20 DIAGNOSIS — Z888 Allergy status to other drugs, medicaments and biological substances status: Secondary | ICD-10-CM | POA: Insufficient documentation

## 2018-05-20 DIAGNOSIS — M1710 Unilateral primary osteoarthritis, unspecified knee: Secondary | ICD-10-CM | POA: Diagnosis not present

## 2018-05-20 HISTORY — PX: LAPAROSCOPIC BILATERAL SALPINGO OOPHERECTOMY: SHX5890

## 2018-05-20 HISTORY — PX: ROBOTIC ASSISTED TOTAL HYSTERECTOMY WITH SACROCOLPOPEXY: SHX6799

## 2018-05-20 LAB — TYPE AND SCREEN
ABO/RH(D): A POS
Antibody Screen: NEGATIVE

## 2018-05-20 SURGERY — HYSTERECTOMY, TOTAL, ROBOT-ASSISTED, WITH SACROCOLPOPEXY
Anesthesia: General | Site: Abdomen

## 2018-05-20 MED ORDER — DEXAMETHASONE SODIUM PHOSPHATE 10 MG/ML IJ SOLN
INTRAMUSCULAR | Status: DC | PRN
  Administered 2018-05-20: 10 mg via INTRAVENOUS

## 2018-05-20 MED ORDER — KETOROLAC TROMETHAMINE 30 MG/ML IJ SOLN
INTRAMUSCULAR | Status: DC | PRN
  Administered 2018-05-20: 30 mg via INTRAVENOUS

## 2018-05-20 MED ORDER — ARTIFICIAL TEARS OPHTHALMIC OINT
TOPICAL_OINTMENT | OPHTHALMIC | Status: AC
  Filled 2018-05-20: qty 3.5

## 2018-05-20 MED ORDER — BUPIVACAINE HCL (PF) 0.25 % IJ SOLN
INTRAMUSCULAR | Status: AC
  Filled 2018-05-20: qty 30

## 2018-05-20 MED ORDER — SUGAMMADEX SODIUM 200 MG/2ML IV SOLN
INTRAVENOUS | Status: DC | PRN
  Administered 2018-05-20: 400 mg via INTRAVENOUS

## 2018-05-20 MED ORDER — SUGAMMADEX SODIUM 200 MG/2ML IV SOLN
INTRAVENOUS | Status: AC
  Filled 2018-05-20: qty 2

## 2018-05-20 MED ORDER — LIDOCAINE 2% (20 MG/ML) 5 ML SYRINGE
INTRAMUSCULAR | Status: AC
  Filled 2018-05-20: qty 5

## 2018-05-20 MED ORDER — FENTANYL CITRATE (PF) 100 MCG/2ML IJ SOLN
INTRAMUSCULAR | Status: AC
  Filled 2018-05-20: qty 2

## 2018-05-20 MED ORDER — OXYCODONE-ACETAMINOPHEN 5-325 MG PO TABS: 2.0000 | ORAL_TABLET | ORAL | Status: DC | PRN

## 2018-05-20 MED ORDER — FENTANYL CITRATE (PF) 100 MCG/2ML IJ SOLN
INTRAMUSCULAR | Status: DC | PRN
  Administered 2018-05-20 (×2): 25 ug via INTRAVENOUS
  Administered 2018-05-20: 50 ug via INTRAVENOUS
  Administered 2018-05-20 (×2): 25 ug via INTRAVENOUS
  Administered 2018-05-20: 50 ug via INTRAVENOUS
  Administered 2018-05-20 (×2): 25 ug via INTRAVENOUS

## 2018-05-20 MED ORDER — IBUPROFEN 200 MG PO TABS
ORAL_TABLET | ORAL | Status: AC
  Filled 2018-05-20: qty 3

## 2018-05-20 MED ORDER — BUPIVACAINE HCL (PF) 0.25 % IJ SOLN
INTRAMUSCULAR | Status: DC | PRN
  Administered 2018-05-20: 15 mL

## 2018-05-20 MED ORDER — LACTATED RINGERS IV SOLN
INTRAVENOUS | Status: DC
  Administered 2018-05-20 (×2): via INTRAVENOUS

## 2018-05-20 MED ORDER — SODIUM CHLORIDE 0.9 % IR SOLN
Status: DC | PRN
  Administered 2018-05-20: 3000 mL

## 2018-05-20 MED ORDER — GENTAMICIN SULFATE 40 MG/ML IJ SOLN
INTRAVENOUS | Status: AC
  Administered 2018-05-20: 350 mg via INTRAVENOUS
  Filled 2018-05-20: qty 8.75

## 2018-05-20 MED ORDER — ROPINIROLE HCL 1 MG PO TABS
1.0000 mg | ORAL_TABLET | Freq: Every day | ORAL | Status: DC
  Administered 2018-05-20: 1 mg via ORAL
  Filled 2018-05-20: qty 1

## 2018-05-20 MED ORDER — KETAMINE HCL 10 MG/ML IJ SOLN
INTRAMUSCULAR | Status: AC
  Filled 2018-05-20: qty 1

## 2018-05-20 MED ORDER — KETAMINE HCL 10 MG/ML IJ SOLN
INTRAMUSCULAR | Status: DC | PRN
  Administered 2018-05-20: 30 mg via INTRAVENOUS

## 2018-05-20 MED ORDER — LACTATED RINGERS IV SOLN
INTRAVENOUS | Status: DC
  Administered 2018-05-20: 15:00:00 via INTRAVENOUS

## 2018-05-20 MED ORDER — ROCURONIUM BROMIDE 10 MG/ML (PF) SYRINGE
PREFILLED_SYRINGE | INTRAVENOUS | Status: DC | PRN
  Administered 2018-05-20: 50 mg via INTRAVENOUS
  Administered 2018-05-20: 10 mg via INTRAVENOUS
  Administered 2018-05-20: 20 mg via INTRAVENOUS

## 2018-05-20 MED ORDER — FENTANYL CITRATE (PF) 100 MCG/2ML IJ SOLN
25.0000 ug | INTRAMUSCULAR | Status: DC | PRN
  Administered 2018-05-20 (×2): 50 ug via INTRAVENOUS

## 2018-05-20 MED ORDER — SUGAMMADEX SODIUM 500 MG/5ML IV SOLN
INTRAVENOUS | Status: AC
  Filled 2018-05-20: qty 5

## 2018-05-20 MED ORDER — LIDOCAINE 2% (20 MG/ML) 5 ML SYRINGE
INTRAMUSCULAR | Status: DC | PRN
  Administered 2018-05-20: 1.5 mg/kg/h via INTRAVENOUS

## 2018-05-20 MED ORDER — MIDAZOLAM HCL 2 MG/2ML IJ SOLN
INTRAMUSCULAR | Status: AC
  Filled 2018-05-20: qty 2

## 2018-05-20 MED ORDER — DEXAMETHASONE SODIUM PHOSPHATE 10 MG/ML IJ SOLN
INTRAMUSCULAR | Status: AC
  Filled 2018-05-20: qty 1

## 2018-05-20 MED ORDER — ONDANSETRON HCL 4 MG/2ML IJ SOLN
INTRAMUSCULAR | Status: AC
  Filled 2018-05-20: qty 2

## 2018-05-20 MED ORDER — ACETAMINOPHEN 500 MG PO TABS
1000.0000 mg | ORAL_TABLET | Freq: Once | ORAL | Status: AC
  Administered 2018-05-20: 1000 mg via ORAL
  Filled 2018-05-20: qty 2

## 2018-05-20 MED ORDER — TRAMADOL HCL 50 MG PO TABS
50.0000 mg | ORAL_TABLET | Freq: Four times a day (QID) | ORAL | Status: DC | PRN
  Administered 2018-05-20 – 2018-05-21 (×2): 50 mg via ORAL

## 2018-05-20 MED ORDER — FENTANYL CITRATE (PF) 250 MCG/5ML IJ SOLN
INTRAMUSCULAR | Status: AC
  Filled 2018-05-20: qty 5

## 2018-05-20 MED ORDER — IBUPROFEN 200 MG PO TABS
600.0000 mg | ORAL_TABLET | Freq: Four times a day (QID) | ORAL | Status: DC | PRN
  Administered 2018-05-20 – 2018-05-21 (×2): 600 mg via ORAL

## 2018-05-20 MED ORDER — LIDOCAINE 2% (20 MG/ML) 5 ML SYRINGE
INTRAMUSCULAR | Status: DC | PRN
  Administered 2018-05-20: 80 mg via INTRAVENOUS

## 2018-05-20 MED ORDER — ONDANSETRON HCL 4 MG/2ML IJ SOLN: 4.0000 mg | Freq: Once | INTRAMUSCULAR | Status: DC | PRN

## 2018-05-20 MED ORDER — EPHEDRINE SULFATE-NACL 50-0.9 MG/10ML-% IV SOSY
PREFILLED_SYRINGE | INTRAVENOUS | Status: DC | PRN
  Administered 2018-05-20: 10 mg via INTRAVENOUS
  Administered 2018-05-20: 5 mg via INTRAVENOUS

## 2018-05-20 MED ORDER — PROPOFOL 10 MG/ML IV BOLUS
INTRAVENOUS | Status: DC | PRN
  Administered 2018-05-20: 100 mg via INTRAVENOUS

## 2018-05-20 MED ORDER — ROCURONIUM BROMIDE 100 MG/10ML IV SOLN
INTRAVENOUS | Status: AC
  Filled 2018-05-20: qty 1

## 2018-05-20 MED ORDER — TRAMADOL HCL 50 MG PO TABS
ORAL_TABLET | ORAL | Status: AC
  Filled 2018-05-20: qty 1

## 2018-05-20 MED ORDER — ONDANSETRON HCL 4 MG/2ML IJ SOLN
INTRAMUSCULAR | Status: DC | PRN
  Administered 2018-05-20: 4 mg via INTRAVENOUS

## 2018-05-20 MED ORDER — PROPOFOL 10 MG/ML IV BOLUS
INTRAVENOUS | Status: AC
  Filled 2018-05-20: qty 20

## 2018-05-20 MED ORDER — KETOROLAC TROMETHAMINE 30 MG/ML IJ SOLN
INTRAMUSCULAR | Status: AC
  Filled 2018-05-20: qty 1

## 2018-05-20 SURGICAL SUPPLY — 62 items
ADH SKN CLS APL DERMABOND .7 (GAUZE/BANDAGES/DRESSINGS) ×2
BAG SPEC RTRVL LRG 6X4 10 (ENDOMECHANICALS) ×2
BARRIER ADHS 3X4 INTERCEED (GAUZE/BANDAGES/DRESSINGS) IMPLANT
BRR ADH 4X3 ABS CNTRL BYND (GAUZE/BANDAGES/DRESSINGS)
CATH FOLEY 3WAY  5CC 16FR (CATHETERS) ×2
CATH FOLEY 3WAY 5CC 16FR (CATHETERS) ×2 IMPLANT
COVER BACK TABLE 60X90IN (DRAPES) ×2 IMPLANT
COVER TIP SHEARS 8 DVNC (MISCELLANEOUS) ×2 IMPLANT
COVER TIP SHEARS 8MM DA VINCI (MISCELLANEOUS) ×2
DECANTER SPIKE VIAL GLASS SM (MISCELLANEOUS) ×8 IMPLANT
DEFOGGER SCOPE WARMER CLEARIFY (MISCELLANEOUS) ×4 IMPLANT
DERMABOND ADVANCED (GAUZE/BANDAGES/DRESSINGS) ×2
DERMABOND ADVANCED .7 DNX12 (GAUZE/BANDAGES/DRESSINGS) ×2 IMPLANT
DRAPE ARM DVNC X/XI (DISPOSABLE) ×8 IMPLANT
DRAPE COLUMN DVNC XI (DISPOSABLE) ×2 IMPLANT
DRAPE DA VINCI XI ARM (DISPOSABLE) ×8
DRAPE DA VINCI XI COLUMN (DISPOSABLE) ×2
DRSG OPSITE POSTOP 3X4 (GAUZE/BANDAGES/DRESSINGS) ×4 IMPLANT
DURAPREP 26ML APPLICATOR (WOUND CARE) ×4 IMPLANT
ELECT REM PT RETURN 15FT ADLT (MISCELLANEOUS) ×4 IMPLANT
GAUZE VASELINE 3X9 (GAUZE/BANDAGES/DRESSINGS) ×2 IMPLANT
GLOVE BIO SURGEON STRL SZ 6.5 (GLOVE) ×9 IMPLANT
GLOVE BIO SURGEONS STRL SZ 6.5 (GLOVE) ×3
GLOVE BIOGEL PI IND STRL 7.0 (GLOVE) ×10 IMPLANT
GLOVE BIOGEL PI INDICATOR 7.0 (GLOVE) ×10
GLOVE ECLIPSE 6.5 STRL STRAW (GLOVE) ×2 IMPLANT
GLOVE SURG SS PI 6.5 STRL IVOR (GLOVE) ×3 IMPLANT
IRRIG SUCT STRYKERFLOW 2 WTIP (MISCELLANEOUS) ×4
IRRIGATION SUCT STRKRFLW 2 WTP (MISCELLANEOUS) ×2 IMPLANT
LEGGING LITHOTOMY PAIR STRL (DRAPES) ×4 IMPLANT
MESH ARTISYN Y (Sling) ×2 IMPLANT
OBTURATOR OPTICAL STANDARD 8MM (TROCAR)
OBTURATOR OPTICAL STND 8 DVNC (TROCAR)
OBTURATOR OPTICALSTD 8 DVNC (TROCAR) ×2 IMPLANT
OCCLUDER COLPOPNEUMO (BALLOONS) ×3 IMPLANT
PACK ROBOT WH (CUSTOM PROCEDURE TRAY) ×4 IMPLANT
PACK ROBOTIC GOWN (GOWN DISPOSABLE) ×4 IMPLANT
PACK TRENDGUARD 450 HYBRID PRO (MISCELLANEOUS) ×1 IMPLANT
PAD PREP 24X48 CUFFED NSTRL (MISCELLANEOUS) ×4 IMPLANT
POSITIONER SURGICAL ARM (MISCELLANEOUS) ×8 IMPLANT
POUCH SPECIMEN RETRIEVAL 10MM (ENDOMECHANICALS) ×2 IMPLANT
SEAL CANN UNIV 5-8 DVNC XI (MISCELLANEOUS) ×8 IMPLANT
SEAL XI 5MM-8MM UNIVERSAL (MISCELLANEOUS) ×8
SET CYSTO W/LG BORE CLAMP LF (SET/KITS/TRAYS/PACK) IMPLANT
SET TRI-LUMEN FLTR TB AIRSEAL (TUBING) ×4 IMPLANT
SUT ETHIBOND 0 (SUTURE) IMPLANT
SUT GORETEX NAB #0 THX26 36IN (SUTURE) ×10 IMPLANT
SUT VIC AB 4-0 PS2 27 (SUTURE) ×10 IMPLANT
SUT VICRYL 0 UR6 27IN ABS (SUTURE) ×4 IMPLANT
SUT VLOC 180 0 9IN  GS21 (SUTURE) ×2
SUT VLOC 180 0 9IN GS21 (SUTURE) ×1 IMPLANT
SUT VLOC 180 2-0 6IN GS21 (SUTURE) IMPLANT
SUT VLOC 180 2-0 9IN GS21 (SUTURE) IMPLANT
SUT VLOC NONABS 0 BL6 GS-21 (SUTURE) IMPLANT
TIP UTERINE 5.1X6CM LAV DISP (MISCELLANEOUS) IMPLANT
TIP UTERINE 6.7X10CM GRN DISP (MISCELLANEOUS) IMPLANT
TIP UTERINE 6.7X6CM WHT DISP (MISCELLANEOUS) IMPLANT
TIP UTERINE 6.7X8CM BLUE DISP (MISCELLANEOUS) ×2 IMPLANT
TOWEL OR 17X26 10 PK STRL BLUE (TOWEL DISPOSABLE) ×8 IMPLANT
TRENDGUARD 450 HYBRID PRO PACK (MISCELLANEOUS) ×4
TROCAR PORT AIRSEAL 8X120 (TROCAR) ×3 IMPLANT
TROCAR XCEL NON-BLD 11X100MML (ENDOMECHANICALS) ×2 IMPLANT

## 2018-05-20 NOTE — Anesthesia Preprocedure Evaluation (Addendum)
Anesthesia Evaluation  Patient identified by MRN, date of birth, ID band Patient awake    Reviewed: Allergy & Precautions, NPO status , Patient's Chart, lab work & pertinent test results  Airway Mallampati: III  TM Distance: <3 FB Neck ROM: Full    Dental  (+) Teeth Intact, Dental Advisory Given   Pulmonary sleep apnea ,    Pulmonary exam normal breath sounds clear to auscultation       Cardiovascular hypertension, Pt. on medications Normal cardiovascular exam+ dysrhythmias (PAF) Atrial Fibrillation  Rhythm:Regular Rate:Normal     Neuro/Psych  Headaches, PSYCHIATRIC DISORDERS Depression    GI/Hepatic Neg liver ROS, hiatal hernia, PUD, GERD  Medicated,  Endo/Other  negative endocrine ROS  Renal/GU negative Renal ROS     Musculoskeletal negative musculoskeletal ROS (+)   Abdominal   Peds  Hematology negative hematology ROS (+)   Anesthesia Other Findings Day of surgery medications reviewed with the patient.  Reproductive/Obstetrics complete uterine prolapse                            Anesthesia Physical Anesthesia Plan  ASA: III  Anesthesia Plan: General   Post-op Pain Management:    Induction: Intravenous  PONV Risk Score and Plan: 4 or greater and Dexamethasone, Ondansetron and Treatment may vary due to age or medical condition  Airway Management Planned: Oral ETT  Additional Equipment:   Intra-op Plan:   Post-operative Plan: Extubation in OR  Informed Consent: I have reviewed the patients History and Physical, chart, labs and discussed the procedure including the risks, benefits and alternatives for the proposed anesthesia with the patient or authorized representative who has indicated his/her understanding and acceptance.   Dental advisory given  Plan Discussed with: CRNA  Anesthesia Plan Comments:         Anesthesia Quick Evaluation

## 2018-05-20 NOTE — H&P (Signed)
Cassandra Miles is an 77 y.o. female. G2P2L2   RP: Complete Uterine prolapse and Cystocele for XI Robotic Supracervical Hysterectomy/BSO, Sacrocolpopexy  HPI:  Bulge rubbing outside at vulva whenever she is standing.  Very active physically and socially.  No SUI.   Menopause on no HRT.  Investigated for PMB in 12/2017.  Pelvic US/MRI showed a probable about 3 cm irregular uterine fibroid, EBx scant, benign.  Pelvic discomfort at times, but no severe pain currently.  Abstinent. BMs wnl.  Pertinent Gynecological History: Contraception: post menopausal status Blood transfusions: yes Sexually transmitted diseases: no past history Last mammogram: normal  Last pap: normal  OB History: G2, P2, L2   Menstrual History: No LMP recorded. Patient is postmenopausal.    Past Medical History:  Diagnosis Date  . Atypical chest pain    a. 09/2013 Admitted -> Negative stress echo, EF 60%, no rwma.  . Avascular necrosis ([place])   . Barrett esophagus   . Blood transfusion without reported diagnosis    WITH  HIP REPLACEMENT 06,07  . Cataract   . Depression   . GERD (gastroesophageal reflux disease)   . Hepatic cyst   . Hiatal hernia   . Hyperlipidemia   . Hypertension   . Osteoarthritis    a. knee  . PAF (paroxysmal atrial fibrillation) ([place])    a. 1 documented episode in 2008., saw cardiology i 2017 (see epi enocunter) . patient states no recocurrence of cardiac sx since   . Palpitations    a. 08/2013 event monitor: no events but was noncompliant;  b. metoprolol added 09/2013.  Marland Kitchen Peptic ulcer disease   . Restless leg syndrome   . Sleep apnea    had device but could not tolerate plans to get set up with new advice after OBGYN surgery   . Spinal stenosis   . Vitamin D deficiency     Past Surgical History:  Procedure Laterality Date  . BREAST REDUCTION SURGERY    . CHOLECYSTECTOMY    . COLONOSCOPY    . DECOMPRESSION CORE HIP     bilateral hips  . EYE SURGERY  2011   cataract right eye    . HIP FRACTURE SURGERY     Bilateral hip replacements  . ORIF WRIST FRACTURE  09/14/2011   Procedure: OPEN REDUCTION INTERNAL FIXATION (ORIF) WRIST FRACTURE;  Surgeon: Willa Frater III;  Location: [facility];  Service: Orthopedics;  Laterality: N/A;  . TUBAL LIGATION      Family History  Problem Relation Age of Onset  . Arthritis Father   . Stroke Mother        x2  . Heart disease Mother   . Heart disease Brother        Stenting   . Hyperlipidemia Brother   . Melanoma Brother   . Colon cancer Paternal Aunt        x 2  . Diabetes Brother     Social History:  reports that she has never smoked. She has never used smokeless tobacco. She reports that she does not drink alcohol or use drugs.  Allergies:  Allergies  Allergen Reactions  . Bupropion Hcl Nausea Only  . Ciprofloxacin Nausea Only  . Penicillins Rash    As a child. Has patient had a PCN reaction causing immediate rash, facial/tongue/throat swelling, SOB or lightheadedness with hypotension: No Has patient had a PCN reaction causing severe rash involving mucus membranes or skin necrosis: No Has patient had a PCN reaction that required hospitalization:  No Has patient had a PCN reaction occurring within the last 10 years: No If all of the above answers are "NO", then may proceed with Cephalosporin use.     Medications Prior to Admission  Medication Sig Dispense Refill Last Dose  . atorvastatin (LIPITOR) 10 MG tablet Take 10 mg by mouth daily.   05/20/2018 at 0530  . calcium carbonate (TUMS - DOSED IN MG ELEMENTAL CALCIUM) 500 MG chewable tablet Chew 2 tablets by mouth every 4 (four) hours as needed for indigestion or heartburn.   Past Week at Unknown time  . Cholecalciferol (VITAMIN D3) 2000 units TABS Take 2,000 mg by mouth daily.   05/06/2018  . ibuprofen (ADVIL,MOTRIN) 200 MG tablet Take 200-400 mg by mouth every 6 (six) hours as needed for mild pain.   Past Week at Unknown time  . lisinopril (PRINIVIL,ZESTRIL) 10 MG  tablet Take 10 mg by mouth daily.   05/19/2018 at Unknown time  . pantoprazole (PROTONIX) 20 MG tablet Take 20 mg by mouth daily before breakfast.   Past Week at Unknown time  . rOPINIRole (REQUIP) 1 MG tablet take 1 tablet by mouth at bedtime (Patient taking differently: Take 1 mg by mouth at bedtime. ) 30 tablet 5 05/19/2018 at Unknown time  . sertraline (ZOLOFT) 100 MG tablet Take 1 tablet (100 mg total) by mouth daily. (Patient taking differently: Take 150 mg by mouth daily. ) 30 tablet 5 05/20/2018 at 0530  . dicyclomine (BENTYL) 10 MG capsule Take 10 mg by mouth daily before breakfast.    Unknown at Unknown time    REVIEW OF SYSTEMS: A ROS was performed and pertinent positives and negatives are included in the history.  GENERAL: No fevers or chills. HEENT: No change in vision, no earache, sore throat or sinus congestion. NECK: No pain or stiffness. CARDIOVASCULAR: No chest pain or pressure. No palpitations. PULMONARY: No shortness of breath, cough or wheeze. GASTROINTESTINAL: No abdominal pain, nausea, vomiting or diarrhea, melena or bright red blood per rectum. GENITOURINARY: No urinary frequency, urgency, hesitancy or dysuria. MUSCULOSKELETAL: No joint or muscle pain, no back pain, no recent trauma. DERMATOLOGIC: No rash, no itching, no lesions. ENDOCRINE: No polyuria, polydipsia, no heat or cold intolerance. No recent change in weight. HEMATOLOGICAL: No anemia or easy bruising or bleeding. NEUROLOGIC: No headache, seizures, numbness, tingling or weakness. PSYCHIATRIC: No depression, no loss of interest in normal activity or change in sleep pattern.     Blood pressure (!) 175/84, pulse 80, temperature 97.8 F (36.6 C), temperature source Oral, resp. rate 16, height 5\' 6"  (1.676 m), weight 83.7 kg, SpO2 96 %.  Physical Exam:  See office notes  1. Complete uterine prolapse Very symptomatic complete uterine prolapse with a grade 2/4 cystocele.  After discussing all alternatives, including  pessary, Uterosacral ligament suspension, Sacrospinous suspension, decision to proceed with a XI Robotic Supracervical Hysterectomy/BSO, Sacrocolpopexy.  Surgery, risks and postop recovery thoroughly discussed with patient who voiced understanding and agreement.  2. Baden-Walker grade 2 cystocele Consulted with Dr Matilde Sprang who didn't recommend a Sling procedure.                        Patient was counseled as to the risk of surgery to include the following:  1. Infection (prohylactic antibiotics will be administered)  2. DVT/Pulmonary Embolism (prophylactic pneumo compression stockings will be used)  3.Trauma to internal organs requiring additional surgical procedure to repair any injury to internal organs requiring perhaps additional hospitalization  days.  4.Hemmorhage requiring transfusion and blood products which carry risks such as anaphylactic reaction, hepatitis and AIDS  Patient had received literature information on the procedure scheduled and all her questions were answered and fully accepts all risk.   Marie-Lyne Syliva Mee 05/20/2018, 7:45 AM

## 2018-05-20 NOTE — Progress Notes (Signed)
Spoke with Dr Dellis Filbert and patient is to spend the night as an outpatient in a bed at the [place] at [facility] as originally scheduled.

## 2018-05-20 NOTE — Anesthesia Procedure Notes (Signed)
Procedure Name: Intubation Date/Time: 05/20/2018 8:41 AM Performed by: Suan Halter, CRNA Pre-anesthesia Checklist: Patient identified, Emergency Drugs available, Suction available and Patient being monitored Patient Re-evaluated:Patient Re-evaluated prior to induction Oxygen Delivery Method: Circle system utilized Preoxygenation: Pre-oxygenation with 100% oxygen Induction Type: IV induction Ventilation: Mask ventilation without difficulty Laryngoscope Size: Mac and 3 Grade View: Grade III Tube type: Oral Tube size: 7.0 mm Number of attempts: 3 Airway Equipment and Method: Stylet and Oral airway Placement Confirmation: ETT inserted through vocal cords under direct vision,  positive ETCO2 and breath sounds checked- equal and bilateral Secured at: 22 cm Tube secured with: Tape Dental Injury: Teeth and Oropharynx as per pre-operative assessment  Difficulty Due To: Difficult Airway- due to anterior larynx and Difficulty was anticipated Comments: EMT Matthew DL x 2 and unsuccessful intubation.  DL x 1 by L Crews Mccollam CRNA  - anterior larynx with view of arytnoids, cords not visualized.  Easy mask airway

## 2018-05-20 NOTE — Progress Notes (Signed)
Patient asked if she could get her requip ordered. Dr. Dellis Filbert notified and new orders were given for requip 1mg  at bedtime and also for a KPAD

## 2018-05-20 NOTE — Op Note (Addendum)
Operative Note  05/20/2018  12:35 PM  PATIENT:  Cassandra Miles  77 y.o. female  PRE-OPERATIVE DIAGNOSIS:  Symptomatic complete uterine prolapse, Cystocele grade 2/4  POST-OPERATIVE DIAGNOSIS:  Symptomatic complete uterine prolapse, Cystocele grade 2/4  PROCEDURE:  Procedure(s): XI ROBOTIC ASSISTED SUPRACERVICAL HYSTERECTOMY WITH  BILATERAL SALPINGO OOPHORECTOMY, SACROCOLPOPEXY USING A "Y" MESH  SURGEON:  Surgeon(s): Princess Bruins, MD Fontaine, Belinda Block, MD  ANESTHESIA:   general  FINDINGS:Uterus with an anterior right low subserosal myoma.  Bilateral s/p tubal ligation, normal bilateral ovaries.  DESCRIPTION OF OPERATION: Under general anesthesia with endotracheal intubation the patient is in lithotomy position.  She is prepped with DuraPrep on the abdomen and with Betadine on the suprapubic, vulvar and vaginal areas.  Timeout is done.  The patient is draped as usual.  The vaginal exam reveals a retroverted uterus normal volume, bilateral normal adnexa.  The weighted speculum is inserted in the vagina and the anterior lip of the cervix is grasped with a tenaculum.  The hysterometry is at 9 cm, we used a #8 Rumi with a small Ko ring.  The other instruments were removed.  The Foley is inserted in the bladder.  We infiltrate Marcaine one quarter plain at the supraumbilical area.  We make a 1.5 cm incision at that level.  The aponeurosis is grasped with Coker's and opened under direct vision with the Mayo scissors.  The parietal peritoneum is opened bluntly with the finger.  The Hossein is inserted at that level and up pneumoperitoneum was created with CO2.  The camera was inserted confirming no adhesion with the anterior wall of the abdomen where the ports will be inserted.  The uterus presents a 2 cm subserosal fibroid at the lower right anterior aspect of the uterus.  Both tubes are status post tubal ligation.  Both ovaries are normal in size and appearance.  No other pathology in the  pelvis.  Will infiltrate the skin with Marcaine one quarter plain at all port sites.  Small incisions are done with a scalpel.  2 robotic ports are inserted under direct vision on the right side and one robotic port on the left side.  The 5 mm assistant port is inserted under direct vision on the left medial side.  The patient is placed in deep Trendelenburg.  The robot is docked from the right side.  We used the fenestrated clamp in the fourth arm, the EndoShears scissors and the third arm, the camera in the second arm, the PK in the first arm.  All robotic instruments were inserted under direct vision.  I go to the console.      Both ureters are seen in normal anatomic position with peristalsis.  We start on the right side with cauterization and section of the right infundibulopelvic ligament.  We then followed just under the right ovary cauterizing and sectioning.  We reached the broad ligaments and stay very close to the uterus cauterizing and sectioning.  The visceral anterior peritoneum was then opened and we start descending the bladder.  We proceeded exactly the same way on the left side.  We further descend the bladder well past the Colrain.  The visceral peritoneum is opened posteriorly as well and we opened that space for future mesh placement.  We then cauterized the uterine artery on the right side, then on the left side.  We section just above the point of cauterization and then section the uterus and between the lower uterine segment and the cervix.  The uterus without the cervix, both tubes and both ovaries are completely detached.  The specimen is put in the posterior cul-de-sac.  Hemostasis is adequate level of the superior aspect of the cervix.  We retract the Rumi and cauterize the endocervical canal.  The EEA is put in place in the vagina.  We further descend the bladder anteriorly to about 6 cm from the top of the cervix.  Dissection is already adequate at the posterior aspect of the cervix.   We cut the wide mesh at 5 cm anteriorly and at 4 cm posteriorly.  The mesh is inserted in the abdominal pelvic cavity.  We switched the robotic instruments to a cutting needle driver in the third arm and a normal needle driver in the first arm.  The mesh was fixed anteriorly with 6 separate stitches of Gore-Tex.  The mesh was then fixed posteriorly with 4 stitches of Gore-Tex.  All needles were removed from the abdominal pelvic cavity as we go.  We then switched back the instrument to the EndoShears scissor and the PK.  The visceral peritoneum was opened over the sacrum and a tunnel was created all the way to the pelvis.  The promontory was dissected until the anterior ligament of the sacrum was well seen.  We then measured the tail part of the Y mesh for a good suspension without tension.  The instruments were switched back to the cutting needle driver and the normal needle driver.  2 separate stitches of Gore-Tex were used to fix the tail part of the Y mesh to the anterior ligament of the sacrum.  The excess mesh was trimmed where necessary.  We then use a V lock 2-0 at 9 inches to close the peritoneum on top of the mesh with a running suture.  The mesh was completely buried.  Hemostasis adequate at all levels.  Pictures were taken.  All robotic instruments were removed.  The robot was undocked.  We went to laparoscopy time.      We use an Endobag through the supraumbilical port to remove the specimen.  The specimen was sent to pathology.  All laparoscopic instruments were removed.  All ports were removed.  The supraumbilical incision was closed by attaching the pursestring stitch at the aponeurosis.  The skin was closed with separate stitches of Vicryl 4-0 at all incisions.  Dermabond was added at all incisions.  The patient was brought to recovery room in good and stable status.  ESTIMATED BLOOD LOSS: 20 mL   Intake/Output Summary (Last 24 hours) at 05/20/2018 1235 Last data filed at 05/20/2018 1217 Gross  per 24 hour  Intake 1000 ml  Output 20 ml  Net 980 ml     BLOOD ADMINISTERED:none   LOCAL MEDICATIONS USED:  MARCAINE     SPECIMEN:  Source of Specimen:  Uterus (without cervix), bilateral tubes and ovaries  DISPOSITION OF SPECIMEN:  PATHOLOGY  COUNTS:  YES  PLAN OF CARE: Transfer to PACU  Marie-Lyne LavoieMD12:35 PM

## 2018-05-20 NOTE — Transfer of Care (Signed)
Immediate Anesthesia Transfer of Care Note  Patient: Cassandra Miles  Procedure(s) Performed: Procedure(s) (LRB): XI ROBOTIC ASSISTED Supracervical  HYSTERECTOMY WITH SACROCOLPOPEXY USING A "Y" MESH (N/A) LAPAROSCOPIC BILATERAL SALPINGO OOPHORECTOMY (Bilateral)  Patient Location: PACU  Anesthesia Type: General  Level of Consciousness: awake, oriented, sedated and patient cooperative  Airway & Oxygen Therapy: Patient Spontanous Breathing and Patient connected to face mask oxygen  Post-op Assessment: Report given to PACU RN and Post -op Vital signs reviewed and stable  Post vital signs: Reviewed and stable  Complications: No apparent anesthesia complications Last Vitals:  Vitals Value Taken Time  BP 142/67 05/20/2018 12:36 PM  Temp    Pulse 79 05/20/2018 12:44 PM  Resp 15 05/20/2018 12:44 PM  SpO2 98 % 05/20/2018 12:44 PM  Vitals shown include unvalidated device data.  Last Pain:  Vitals:   05/20/18 0702  TempSrc:   PainSc: 0-No pain      Patients Stated Pain Goal: 4 (05/20/18 8590)

## 2018-05-21 ENCOUNTER — Encounter (HOSPITAL_COMMUNITY): Payer: Self-pay | Admitting: Obstetrics & Gynecology

## 2018-05-21 DIAGNOSIS — N813 Complete uterovaginal prolapse: Secondary | ICD-10-CM | POA: Diagnosis not present

## 2018-05-21 LAB — CBC
HEMATOCRIT: 34.3 % — AB (ref 36.0–46.0)
HEMOGLOBIN: 11 g/dL — AB (ref 12.0–15.0)
MCH: 29.9 pg (ref 26.0–34.0)
MCHC: 32.1 g/dL (ref 30.0–36.0)
MCV: 93.2 fL (ref 78.0–100.0)
Platelets: 272 10*3/uL (ref 150–400)
RBC: 3.68 MIL/uL — ABNORMAL LOW (ref 3.87–5.11)
RDW: 15.1 % (ref 11.5–15.5)
WBC: 14 10*3/uL — AB (ref 4.0–10.5)

## 2018-05-21 MED ORDER — OXYCODONE-ACETAMINOPHEN 7.5-325 MG PO TABS: 1.0000 | ORAL_TABLET | Freq: Four times a day (QID) | ORAL | 0 refills | Status: AC | PRN

## 2018-05-21 NOTE — Anesthesia Postprocedure Evaluation (Signed)
Anesthesia Post Note  Patient: Cassandra Miles  Procedure(s) Performed: XI ROBOTIC ASSISTED Supracervical  HYSTERECTOMY WITH SACROCOLPOPEXY USING A "Y" MESH (N/A Abdomen) LAPAROSCOPIC BILATERAL SALPINGO OOPHORECTOMY (Bilateral )     Anesthesia Post Evaluation  Last Vitals:  Vitals:   05/21/18 0400 05/21/18 0900  BP: (!) 149/73 124/60  Pulse: 72 74  Resp: 16 16  Temp: 36.9 C 36.6 C  SpO2: 96% 95%    Last Pain:  Vitals:   05/21/18 0900  TempSrc:   PainSc: 4                  Catalina Gravel

## 2018-05-21 NOTE — Discharge Summary (Signed)
Physician Discharge Summary  Patient ID: Cassandra Miles MRN: [id number] DOB/AGE: [birth year] 77 y.o.  Admit date: 05/20/2018 Discharge date: 05/21/2018  Admission Diagnoses: complete uterine prolapse with grade 2/4 Cystocele   Discharge Diagnoses: Same Active Problems:   Post-operative state   Discharged Condition: good  Consults:None  Significant Diagnostic Studies: None  Treatments:surgery: XI Robotic Supracervical Hysterectomy with Bilateral Salpingo-Oophorectomy, Sacrocolpopexy  Vitals:   05/21/18 0000 05/21/18 0400  BP: (!) 151/67 (!) 149/73  Pulse: 70 72  Resp: 16 16  Temp: 98 F (36.7 C) 98.5 F (36.9 C)  SpO2: 97% 96%     No intake/output data recorded.   Hospital Course: Good  Discharge Exam: Good  Disposition: Discharge home     Allergies as of 05/21/2018      Reactions   Bupropion Hcl Nausea Only   Ciprofloxacin Nausea Only   Penicillins Rash   As a child. Has patient had a PCN reaction causing immediate rash, facial/tongue/throat swelling, SOB or lightheadedness with hypotension: No Has patient had a PCN reaction causing severe rash involving mucus membranes or skin necrosis: No Has patient had a PCN reaction that required hospitalization: No Has patient had a PCN reaction occurring within the last 10 years: No If all of the above answers are "NO", then may proceed with Cephalosporin use.      Medication List    TAKE these medications   atorvastatin 10 MG tablet Commonly known as:  LIPITOR Take 10 mg by mouth daily.   calcium carbonate 500 MG chewable tablet Commonly known as:  TUMS - dosed in mg elemental calcium Chew 2 tablets by mouth every 4 (four) hours as needed for indigestion or heartburn.   dicyclomine 10 MG capsule Commonly known as:  BENTYL Take 10 mg by mouth daily before breakfast.   ibuprofen 200 MG tablet Commonly known as:  ADVIL,MOTRIN Take 200-400 mg by mouth every 6 (six) hours as needed for mild pain.    lisinopril 10 MG tablet Commonly known as:  PRINIVIL,ZESTRIL Take 10 mg by mouth daily.   oxyCODONE-acetaminophen 7.5-325 MG tablet Commonly known as:  PERCOCET Take 1 tablet by mouth every 6 (six) hours as needed for severe pain.   pantoprazole 20 MG tablet Commonly known as:  PROTONIX Take 20 mg by mouth daily before breakfast.   rOPINIRole 1 MG tablet Commonly known as:  REQUIP take 1 tablet by mouth at bedtime   sertraline 100 MG tablet Commonly known as:  ZOLOFT Take 1 tablet (100 mg total) by mouth daily. What changed:  how much to take   Vitamin D3 2000 units Tabs Take 2,000 mg by mouth daily.        Follow-up Information    Princess Bruins, MD Follow up in 3 week(s).   Specialty:  Obstetrics and Gynecology Contact information: [address] [place] [address] [phone]            Signed: Princess Bruins 05/21/2018, 7:58 AM

## 2018-05-21 NOTE — Progress Notes (Signed)
POD#1 XI Robotic Supracervical Hysterectomy/BSO, Sacrocolpopexy  Subjective: Patient reports tolerating PO, + flatus and no problems voiding.  Has shoulder pains, but no numbness and normal strength and mobility of the shoulders.  Objective: I have reviewed patient's vital signs.  vital signs, intake and output, medications and labs.  Vitals:   05/21/18 0000 05/21/18 0400  BP: (!) 151/67 (!) 149/73  Pulse: 70 72  Resp: 16 16  Temp: 98 F (36.7 C) 98.5 F (36.9 C)  SpO2: 97% 96%   I/O last 3 completed shifts: In: 9030 [P.O.:360; I.V.:1300; IV Piggyback:100] Out: 1120 [Urine:1100; Blood:20] No intake/output data recorded.  Results for orders placed or performed during the hospital encounter of 05/20/18 (from the past 24 hour(s))  CBC     Status: Abnormal   Collection Time: 05/21/18  5:23 AM  Result Value Ref Range   WBC 14.0 (H) 4.0 - 10.5 K/uL   RBC 3.68 (L) 3.87 - 5.11 MIL/uL   Hemoglobin 11.0 (L) 12.0 - 15.0 g/dL   HCT 34.3 (L) 36.0 - 46.0 %   MCV 93.2 78.0 - 100.0 fL   MCH 29.9 26.0 - 34.0 pg   MCHC 32.1 30.0 - 36.0 g/dL   RDW 15.1 11.5 - 15.5 %   Platelets 272 150 - 400 K/uL    EXAM General: alert and cooperative Resp: clear to auscultation bilaterally Cardio: regular rate and rhythm GI: soft, non-tender; bowel sounds normal; no masses,  no organomegaly and incision: clean, dry and intact Extremities: no edema, redness or tenderness in the calves or thighs Vaginal Bleeding: minimal  Assessment: s/p Procedure(s): XI ROBOTIC ASSISTED Supracervical  HYSTERECTOMY WITH SACROCOLPOPEXY USING A "Y" MESH LAPAROSCOPIC BILATERAL SALPINGO OOPHORECTOMY: stable, progressing well and tolerating diet  Plan: Discharge home  LOS: 0 days    Princess Bruins, MD 05/21/2018 8:00 AM

## 2018-05-21 NOTE — Discharge Instructions (Signed)
Sacrocolpopexy, Care After Refer to this sheet in the next few weeks. These instructions provide you with information on caring for yourself after your procedure. Your health care provider may also give you more specific instructions. Your treatment has been planned according to current medical practices, but problems sometimes occur. Call your health care provider if you have any problems or questions after your procedure. What can I expect after the procedure? After your procedure, it is typical to have the following:  Pain.  Some vaginal bleeding.  Fatigue.  Follow these instructions at home: Medicine  Only take medicines as directed by your health care provider.  Make sure to finish antibiotic medicine even if you start to feel better. Bandages  Care for your bandages and cuts (incisions) as directed by your surgeon.  Do not shower until after your bandages have been removed. Activity  Take at least two 10-minute walks each day.  Ask your health care provider when you can return to work and do all your usual activities. It may take 3 months to recover completely.  You will have to restrict many activities when you first return home. For at least 6 weeks: ? Do not lift anything heavier than a gallon of milk. ? Do not sit on a bike seat. ? Do not use a tampon or put anything inside your vagina. ? Do not have sexual intercourse. ? Do not participate instrenuous activities like running or aerobics. Other Instructions  Do not take a bath or go swimming until your health care provider says you can. Most people can shower after about 6 weeks after the procedure.  Drink enough fluids to keep your urine clear or pale yellow. This helps prevent constipation.  See your health care provider to have your stitches or staples removed if necessary.  Keep all follow-up appointments. Contact a health care provider if:  You have chills or fever.  Your pain medicine is not  helping.  You have vaginal bleeding or discharge that will not go away. Get help right away if:  You have a fever for more than 2-3 days.  You have very bad pain.  You have heavy vaginal bleeding or discharge.  You have any signs of infection around your cut (incision). Watch for: ? Redness. ? Tenderness. ? Warmth. ? Pus or other discharge.  You develop a warm, tender area in your leg.  You have chest pain or trouble breathing. This information is not intended to replace advice given to you by your health care provider. Make sure you discuss any questions you have with your health care provider. Document Released: 09/15/2013 Document Revised: 02/16/2016 Document Reviewed: 08/14/2013 Elsevier Interactive Patient Education  [employer].

## 2018-05-30 ENCOUNTER — Telehealth: Payer: Self-pay

## 2018-05-30 NOTE — Telephone Encounter (Signed)
Patient lives in [place], [place] and is there.  Dr. Marguerita Merles patient. She had Robotic hysterectomy and sacrocolpopexy on 05/20/18.   She notes two of her incisions are a little red and one has a little pus on it. She asked about getting an antibiotic or antibiotic cream sent to her pharmacy.  Please advise.

## 2018-05-30 NOTE — Telephone Encounter (Signed)
I would recommend putting warm soaks to the area 4-6 times daily.  The more the better.  Then I would apply over-the-counter antibiotic ointment between the soaks.  Normally this is all this required as long as there is no surrounding evidence of cellulitis as far as spreading along the skin, fever, chills or other more constitutional symptoms and I think this will take care of it.  If it does not seem like it is getting better or certainly seems like it is getting worse and she will need to be seen.

## 2018-05-30 NOTE — Telephone Encounter (Signed)
Called patient back and got voice mail. Per DPR access note on file I read her Dr. Dorette Grate note with recommendations. Asked her to call me back by 1pm when office closes if any questions.

## 2018-06-02 NOTE — Telephone Encounter (Signed)
Patient called back today. Used the warm compresses 3 x day and the otc antibiotic ointment. A few places look better but one looks worse that Friday and could be starting with some cellulitis. She said she thought she would just go and let her PCP there in [place] take a look at it.  She questioned if she should wait a few days before going.  Called her right back but got her voice mail. Per DPR access note on file I  reminded her Dr. Loetta Rough said on Friday if not better or worsens needs to be seen. I advised her to go on today, no value in waiting a few days as it may just worsen.    Call prn.

## 2018-06-13 ENCOUNTER — Ambulatory Visit: Payer: Medicare Other | Admitting: Obstetrics & Gynecology

## 2018-06-19 ENCOUNTER — Ambulatory Visit (INDEPENDENT_AMBULATORY_CARE_PROVIDER_SITE_OTHER): Payer: Medicare Other | Admitting: Obstetrics & Gynecology

## 2018-06-19 ENCOUNTER — Encounter: Payer: Self-pay | Admitting: Obstetrics & Gynecology

## 2018-06-19 VITALS — BP 162/88

## 2018-06-19 DIAGNOSIS — Z09 Encounter for follow-up examination after completed treatment for conditions other than malignant neoplasm: Secondary | ICD-10-CM

## 2018-06-19 NOTE — Progress Notes (Signed)
    ISA HITZ Aug 11, 1941 [id number]        77 y.o.  [place] Divorced.  Lives in [place].  RP: Pop Robotic Supracervical hysterectomy/BSO, Sacrocolpopexy on 05/20/2018  HPI: Very good postop evolution.  In the last week has felt more energetic.  No abdominal pelvic pain.  No vaginal bleeding.  No fever.  Had a little bit of redness around 1 of her incision, seen by her family physician who prescribed an antibiotic.  The probable cellulitis resolved with the antibiotics.  Feeling a good suspension with no bulging vaginally or at the vulva anymore.  Very happy that she is passing urine more easily since surgery.  No stress urinary incontinence.  Bowel movements normal.   OB History  Gravida Para Term Preterm AB Living  2 2       2   SAB TAB Ectopic Multiple Live Births               # Outcome Date GA Lbr Len/2nd Weight Sex Delivery Anes PTL Lv  2 Para           1 Para             Past medical history,surgical history, problem list, medications, allergies, family history and social history were all reviewed and documented in the EPIC chart.   Directed ROS with pertinent positives and negatives documented in the history of present illness/assessment and plan.  Exam:  Vitals:   06/19/18 1420  BP: (!) 162/88   General appearance:  Normal  Abdomen: Incisions healing well.  Stitches trimmed.  Gynecologic exam: Vulva normal.  Bimanual exam:  Cervix high, NT.  Vagina normal, no cysto-rectocele.  No pelvic mass felt, NT.     Assessment/Plan:  77 y.o. G2P2   1. Status post gynecological surgery, follow-up exam Very good postop evolution.  Probable small cellulitis around 1 of the incision treated successfully with antibiotics.  Good suspension of the cervix and vagina.  No cystorectocele.  Follow-up for annual gynecologic exam next year.  Princess Bruins MD, 2:32 PM 06/19/2018

## 2018-06-19 NOTE — Patient Instructions (Signed)
1. Status post gynecological surgery, follow-up exam Very good postop evolution.  Probable small cellulitis around 1 of the incision treated successfully with antibiotics.  Good suspension of the cervix and vagina.  No cystorectocele.  Follow-up for annual gynecologic exam next year.  Cassandra Miles, it was a pleasure seeing you today!  Very happy with your surgical outcome.  Do not hesitate to come back to see me if you have any problem.  If you would like me to do your annual gynecologic exam, I will see you back in a year.
